# Patient Record
Sex: Female | Born: 1950
Health system: Southern US, Community
[De-identification: ages and names within clinical notes are randomized; demographics above are authoritative.]

## PROBLEM LIST (undated history)

## (undated) DIAGNOSIS — M1990 Unspecified osteoarthritis, unspecified site: Secondary | ICD-10-CM

## (undated) DIAGNOSIS — R0989 Other specified symptoms and signs involving the circulatory and respiratory systems: Secondary | ICD-10-CM

## (undated) DIAGNOSIS — E785 Hyperlipidemia, unspecified: Secondary | ICD-10-CM

## (undated) HISTORY — DX: Hyperlipidemia, unspecified: E78.5

## (undated) HISTORY — PX: NECK SURGERY: SHX720

## (undated) HISTORY — DX: Other specified symptoms and signs involving the circulatory and respiratory systems: R09.89

## (undated) HISTORY — DX: Unspecified osteoarthritis, unspecified site: M19.90

---

## 1998-01-05 ENCOUNTER — Ambulatory Visit (HOSPITAL_COMMUNITY): Admission: RE | Admit: 1998-01-05 | Discharge: 1998-01-05 | Payer: Self-pay | Admitting: *Deleted

## 1998-04-28 ENCOUNTER — Ambulatory Visit (HOSPITAL_COMMUNITY): Admission: RE | Admit: 1998-04-28 | Discharge: 1998-04-28 | Payer: Self-pay | Admitting: Internal Medicine

## 1998-12-28 ENCOUNTER — Ambulatory Visit: Admission: RE | Admit: 1998-12-28 | Discharge: 1998-12-28 | Payer: Self-pay | Admitting: Internal Medicine

## 1999-03-23 ENCOUNTER — Encounter: Payer: Self-pay | Admitting: Emergency Medicine

## 1999-03-23 ENCOUNTER — Emergency Department (HOSPITAL_COMMUNITY): Admission: EM | Admit: 1999-03-23 | Discharge: 1999-03-23 | Payer: Self-pay | Admitting: Emergency Medicine

## 1999-06-07 ENCOUNTER — Encounter: Admission: RE | Admit: 1999-06-07 | Discharge: 1999-06-07 | Payer: Self-pay | Admitting: Internal Medicine

## 1999-06-07 ENCOUNTER — Encounter: Payer: Self-pay | Admitting: Internal Medicine

## 1999-06-27 ENCOUNTER — Encounter: Admission: RE | Admit: 1999-06-27 | Discharge: 1999-06-27 | Payer: Self-pay | Admitting: Internal Medicine

## 1999-06-27 ENCOUNTER — Encounter: Payer: Self-pay | Admitting: Internal Medicine

## 1999-12-02 ENCOUNTER — Encounter: Payer: Self-pay | Admitting: Internal Medicine

## 1999-12-02 ENCOUNTER — Encounter: Admission: RE | Admit: 1999-12-02 | Discharge: 1999-12-02 | Payer: Self-pay | Admitting: Internal Medicine

## 1999-12-20 ENCOUNTER — Encounter: Admission: RE | Admit: 1999-12-20 | Discharge: 1999-12-20 | Payer: Self-pay | Admitting: Internal Medicine

## 1999-12-20 ENCOUNTER — Encounter: Payer: Self-pay | Admitting: Internal Medicine

## 1999-12-27 ENCOUNTER — Other Ambulatory Visit: Admission: RE | Admit: 1999-12-27 | Discharge: 1999-12-27 | Payer: Self-pay | Admitting: Internal Medicine

## 2000-06-29 ENCOUNTER — Encounter: Admission: RE | Admit: 2000-06-29 | Discharge: 2000-06-29 | Payer: Self-pay | Admitting: Internal Medicine

## 2000-06-29 ENCOUNTER — Encounter: Payer: Self-pay | Admitting: Internal Medicine

## 2001-07-02 ENCOUNTER — Encounter: Admission: RE | Admit: 2001-07-02 | Discharge: 2001-07-02 | Payer: Self-pay | Admitting: Internal Medicine

## 2001-07-02 ENCOUNTER — Encounter: Payer: Self-pay | Admitting: Internal Medicine

## 2001-07-06 ENCOUNTER — Encounter: Payer: Self-pay | Admitting: Emergency Medicine

## 2001-07-06 ENCOUNTER — Emergency Department (HOSPITAL_COMMUNITY): Admission: EM | Admit: 2001-07-06 | Discharge: 2001-07-06 | Payer: Self-pay | Admitting: Emergency Medicine

## 2002-09-23 ENCOUNTER — Encounter: Payer: Self-pay | Admitting: Internal Medicine

## 2002-09-23 ENCOUNTER — Encounter: Admission: RE | Admit: 2002-09-23 | Discharge: 2002-09-23 | Payer: Self-pay | Admitting: Internal Medicine

## 2003-03-09 ENCOUNTER — Other Ambulatory Visit: Admission: RE | Admit: 2003-03-09 | Discharge: 2003-03-09 | Payer: Self-pay | Admitting: Internal Medicine

## 2003-05-22 ENCOUNTER — Ambulatory Visit (HOSPITAL_COMMUNITY): Admission: RE | Admit: 2003-05-22 | Discharge: 2003-05-22 | Payer: Self-pay | Admitting: Internal Medicine

## 2003-09-01 ENCOUNTER — Inpatient Hospital Stay (HOSPITAL_COMMUNITY): Admission: RE | Admit: 2003-09-01 | Discharge: 2003-09-02 | Payer: Self-pay | Admitting: Neurosurgery

## 2003-09-12 ENCOUNTER — Ambulatory Visit (HOSPITAL_COMMUNITY): Admission: RE | Admit: 2003-09-12 | Discharge: 2003-09-12 | Payer: Self-pay | Admitting: Neurological Surgery

## 2003-12-17 ENCOUNTER — Emergency Department (HOSPITAL_COMMUNITY): Admission: EM | Admit: 2003-12-17 | Discharge: 2003-12-17 | Payer: Self-pay | Admitting: Emergency Medicine

## 2003-12-18 ENCOUNTER — Ambulatory Visit (HOSPITAL_COMMUNITY): Admission: RE | Admit: 2003-12-18 | Discharge: 2003-12-18 | Payer: Self-pay | Admitting: Internal Medicine

## 2004-01-26 ENCOUNTER — Ambulatory Visit (HOSPITAL_COMMUNITY): Admission: RE | Admit: 2004-01-26 | Discharge: 2004-01-26 | Payer: Self-pay | Admitting: Internal Medicine

## 2004-02-24 ENCOUNTER — Emergency Department (HOSPITAL_COMMUNITY): Admission: EM | Admit: 2004-02-24 | Discharge: 2004-02-25 | Payer: Self-pay | Admitting: Emergency Medicine

## 2004-03-15 ENCOUNTER — Emergency Department (HOSPITAL_COMMUNITY): Admission: EM | Admit: 2004-03-15 | Discharge: 2004-03-15 | Payer: Self-pay | Admitting: Emergency Medicine

## 2004-03-19 ENCOUNTER — Ambulatory Visit (HOSPITAL_COMMUNITY): Admission: RE | Admit: 2004-03-19 | Discharge: 2004-03-19 | Payer: Self-pay | Admitting: Internal Medicine

## 2004-08-20 ENCOUNTER — Encounter: Admission: RE | Admit: 2004-08-20 | Discharge: 2004-09-19 | Payer: Self-pay | Admitting: Internal Medicine

## 2004-11-29 ENCOUNTER — Encounter: Admission: RE | Admit: 2004-11-29 | Discharge: 2004-11-29 | Payer: Self-pay | Admitting: Internal Medicine

## 2005-03-28 ENCOUNTER — Other Ambulatory Visit: Admission: RE | Admit: 2005-03-28 | Discharge: 2005-03-28 | Payer: Self-pay | Admitting: Internal Medicine

## 2005-04-17 ENCOUNTER — Encounter: Admission: RE | Admit: 2005-04-17 | Discharge: 2005-04-17 | Payer: Self-pay | Admitting: Neurology

## 2005-07-14 HISTORY — PX: REDUCTION MAMMAPLASTY: SUR839

## 2006-01-05 ENCOUNTER — Emergency Department (HOSPITAL_COMMUNITY): Admission: EM | Admit: 2006-01-05 | Discharge: 2006-01-05 | Payer: Self-pay | Admitting: Family Medicine

## 2006-02-05 ENCOUNTER — Encounter: Admission: RE | Admit: 2006-02-05 | Discharge: 2006-02-05 | Payer: Self-pay | Admitting: Internal Medicine

## 2006-02-05 ENCOUNTER — Encounter: Admission: RE | Admit: 2006-02-05 | Discharge: 2006-02-05 | Payer: Self-pay | Admitting: Gastroenterology

## 2006-08-07 ENCOUNTER — Ambulatory Visit (HOSPITAL_COMMUNITY): Admission: RE | Admit: 2006-08-07 | Discharge: 2006-08-07 | Payer: Self-pay | Admitting: Gastroenterology

## 2007-01-25 ENCOUNTER — Encounter (INDEPENDENT_AMBULATORY_CARE_PROVIDER_SITE_OTHER): Payer: Self-pay | Admitting: Specialist

## 2007-01-25 ENCOUNTER — Ambulatory Visit (HOSPITAL_BASED_OUTPATIENT_CLINIC_OR_DEPARTMENT_OTHER): Admission: RE | Admit: 2007-01-25 | Discharge: 2007-01-26 | Payer: Self-pay | Admitting: Specialist

## 2007-08-19 ENCOUNTER — Inpatient Hospital Stay (HOSPITAL_COMMUNITY): Admission: EM | Admit: 2007-08-19 | Discharge: 2007-08-20 | Payer: Self-pay | Admitting: Emergency Medicine

## 2008-05-03 ENCOUNTER — Ambulatory Visit: Payer: Self-pay | Admitting: Vascular Surgery

## 2008-12-28 ENCOUNTER — Encounter: Admission: RE | Admit: 2008-12-28 | Discharge: 2008-12-28 | Payer: Self-pay | Admitting: Internal Medicine

## 2009-06-14 ENCOUNTER — Other Ambulatory Visit: Admission: RE | Admit: 2009-06-14 | Discharge: 2009-06-14 | Payer: Self-pay | Admitting: Obstetrics and Gynecology

## 2009-07-02 ENCOUNTER — Encounter: Admission: RE | Admit: 2009-07-02 | Discharge: 2009-07-02 | Payer: Self-pay | Admitting: Internal Medicine

## 2010-01-07 ENCOUNTER — Encounter: Admission: RE | Admit: 2010-01-07 | Discharge: 2010-01-07 | Payer: Self-pay | Admitting: Internal Medicine

## 2010-03-07 ENCOUNTER — Emergency Department (HOSPITAL_COMMUNITY): Admission: EM | Admit: 2010-03-07 | Discharge: 2010-03-08 | Payer: Self-pay | Admitting: Emergency Medicine

## 2010-03-11 ENCOUNTER — Encounter: Admission: RE | Admit: 2010-03-11 | Discharge: 2010-03-11 | Payer: Self-pay | Admitting: Internal Medicine

## 2010-04-02 ENCOUNTER — Encounter: Admission: RE | Admit: 2010-04-02 | Discharge: 2010-04-02 | Payer: Self-pay | Admitting: Gastroenterology

## 2010-09-26 LAB — CBC
HCT: 40.4 % (ref 36.0–46.0)
Hemoglobin: 13.7 g/dL (ref 12.0–15.0)
MCH: 31.9 pg (ref 26.0–34.0)
MCV: 94.2 fL (ref 78.0–100.0)
Platelets: 254 10*3/uL (ref 150–400)
RBC: 4.29 MIL/uL (ref 3.87–5.11)
RDW: 12.5 % (ref 11.5–15.5)
WBC: 5.8 10*3/uL (ref 4.0–10.5)

## 2010-09-26 LAB — POCT CARDIAC MARKERS
CKMB, poc: 1.1 ng/mL (ref 1.0–8.0)
CKMB, poc: <1 ng/mL — ABNORMAL LOW (ref 1.0–8.0)
Myoglobin, poc: 62.7 ng/mL (ref 12–200)
Myoglobin, poc: 79.1 ng/mL (ref 12–200)
Troponin i, poc: <0.05 ng/mL (ref 0.00–0.09)
Troponin i, poc: <0.05 ng/mL (ref 0.00–0.09)

## 2010-09-26 LAB — POCT I-STAT, CHEM 8
BUN: 11 mg/dL (ref 6–23)
Calcium, Ion: 1.16 mmol/L (ref 1.12–1.32)
Chloride: 106 mEq/L (ref 96–112)
Creatinine, Ser: 1 mg/dL (ref 0.4–1.2)
Glucose, Bld: 100 mg/dL — ABNORMAL HIGH (ref 70–99)
HCT: 43 % (ref 36.0–46.0)
Hemoglobin: 14.6 g/dL (ref 12.0–15.0)
Potassium: 3.8 mEq/L (ref 3.5–5.1)
Sodium: 144 mEq/L (ref 135–145)
TCO2: 28 mmol/L (ref 0–100)

## 2010-09-26 LAB — DIFFERENTIAL
Basophils Absolute: 0 10*3/uL (ref 0.0–0.1)
Basophils Relative: 1 % (ref 0–1)
Eosinophils Relative: 3 % (ref 0–5)
Lymphocytes Relative: 55 % — ABNORMAL HIGH (ref 12–46)
Lymphs Abs: 3.2 10*3/uL (ref 0.7–4.0)
Monocytes Absolute: 0.3 10*3/uL (ref 0.1–1.0)
Monocytes Relative: 5 % (ref 3–12)
Neutro Abs: 2.1 10*3/uL (ref 1.7–7.7)
Neutrophils Relative %: 36 % — ABNORMAL LOW (ref 43–77)

## 2010-09-26 LAB — D-DIMER, QUANTITATIVE: D-Dimer, Quant: 0.44 ug/mL-FEU (ref 0.00–0.48)

## 2010-11-21 ENCOUNTER — Ambulatory Visit (HOSPITAL_COMMUNITY)
Admission: EM | Admit: 2010-11-21 | Discharge: 2010-11-21 | Discharge status: Against Medical Advice | Payer: 59 | Attending: Emergency Medicine | Admitting: Emergency Medicine

## 2010-11-21 DIAGNOSIS — R079 Chest pain, unspecified: Secondary | ICD-10-CM | POA: Insufficient documentation

## 2010-11-26 NOTE — Consult Note (Signed)
NEW PATIENT CONSULTATION   ELLYN, RUBIANO  DOB:  [birth year]                                       05/03/2008  ZOXWR#:[id number]   The patient presents today for concern regarding discomfort in her  bilateral lower extremities.  She is an otherwise healthy black female  who reports concern regarding tingling and discomfort over her legs,  particularly in her calves, around the area of spider vein  telangiectasia.  She does not have any history of deep vein thrombosis  or varicose veins.  She does have some swelling in both lower  extremities at the end of the day.   PAST MEDICAL HISTORY:  Negative for major medical difficulties.  She  does have seasonal allergies.  She denies any history of hypertension,  elevated cholesterol, or cardiac difficulties.   FAMILY HISTORY:  Significant for diabetes and cardiac disease.   SOCIAL HISTORY:  She is married.  She does not drink alcohol or smoke  cigarettes.   ALLERGIES TO MEDICATIONS:  None.   CURRENT MEDICATIONS:  Allegra, Singulair, Zetia, Ambien, clonazepam.   REVIEW OF SYSTEMS:  Negative for major cardiac, pulmonary, GI, or GU  dysfunction.   PHYSICAL EXAM:  She is a well-developed, well-nourished thin black  female appearing stated age of 84.  Her radial and dorsalis pedis pulses  are 2+ bilaterally.  She does not have any evidence of varicose veins.  She does not have any significant swelling currently.  She does have  multiple spider vein telangiectasia bilaterally.  These are most  pronounced in her right anterior thigh and left posterior popliteal  fossa.   She underwent a screening hand-held duplex by me and this revealed no  evidence of saphenous vein reflux.  I discussed this with the patient.  I explained that she, fortunately, does not appear to have any more  significant difficulty regarding her venous pathology.  She was  concerned regarding the tingling discomfort and also a strong family  history of lower extremity arterial insufficiency.  I have reassured her  that she does have totally normal arterial flow and is at no risk for  limb loss or major complications associated with arterial insufficiency.  I explained that she appears to have no significant valvular disease in  her superficial system.  I explained that the telangiectasia can  occasionally cause some tingling discomfort.  I explained the potential  treatment for this, which would be sclerotherapy.  I explained that she,  in all likelihood will have progressive telangiectasia over time, since  she is forming them currently.  I also explained that this would not be  covered by insurance, and I gave her an estimate regarding the cost for  treating the areas of concern.  She will discuss this with her husband  and notify us should she wish to proceed with sclerotherapy for  treatment of her spider vein telangiectasia.   Larina Earthly, M.D.  Electronically Signed   TFE/MEDQ  D:  05/03/2008  T:  05/04/2008  Job:  [id number]   cc:   Lovenia Kim, D.O.

## 2010-11-26 NOTE — Op Note (Signed)
NAMESPARROW, SIRACUSA          ACCOUNT NO.:  [id number]   MEDICAL RECORD NO.:  [id number]          PATIENT TYPE:  AMB   LOCATION:  DSC                          FACILITY:  [facility]   PHYSICIAN:  Earvin Hansen L. Truesdale, M.D.DATE OF BIRTH:  [birth year]   DATE OF PROCEDURE:  01/25/2007  DATE OF DISCHARGE:                               OPERATIVE REPORT   INDICATIONS FOR PROCEDURE:  This is a 60 year old lady with severe  macromastia with back and shoulder pain secondary to large pendulous  breasts.   PROCEDURES PERFORMED:  Bilateral breast reductions using the inferior  pedicle technique.   ANESTHESIA:  General.   DESCRIPTION OF PROCEDURE:  The patient underwent general anesthesia and  intubated orally.  Prep was done to the chest and breast areas in a  routine fashion using Betadine soap and solution, walled off with  sterile towels and drape so as to make a sterile field.  Then 0.25%  Xylocaine with epinephrine was injected locally for vasoconstriction at  a total of 150 mL per side of 1:200,000 concentration.  The wound was  then scored with #15 blades after sterile prep was done and then the  skin on the inferior pedicle was de-epithelialized with a #20 blade.  The medial and lateral fat sternal pedicles were excised down to  underlying fascia.  Hemostasis was maintained with the Bovie unit on  coagulation.   After this, the patient underwent excision of accessory breast tissue  laterally and the new keyhole area was also debulked.  After proper  hemostasis the flaps were transposed and stayed with 3-0 Prolene.  Subcutaneous closure was done with 3-0 and 2-0 Monocryl x two layers,  and then a running subcuticular stitch of 3-0 Monocryl.  The wounds were  drained with a #10 Blake drain fully floated which was placed in the  depths of the wound and brought out through the lateralmost portion of  the incisions and secured with 3-0 Prolene.  The wounds were cleansed.  Steri-Strips and  soft dressings were applied to the all the areas.  She  withstood the procedures very well and was taken to recovery in  excellent condition after sterile dressings were applied using Xeroform,  ABDs and Hypafix tape.   ESTIMATED BLOOD LOSS:  Estimated blood loss was less than 150 mL.   COMPLICATIONS:  None.      Yaakov Guthrie. Shon Hough, M.D.  Electronically Signed     GLT/MEDQ  D:  01/25/2007  T:  01/25/2007  Job:  [id number]

## 2010-11-26 NOTE — Discharge Summary (Signed)
Susan Carr, Susan Carr          ACCOUNT NO.:  [id number]   MEDICAL RECORD NO.:  [id number]          PATIENT TYPE:  INP   LOCATION:  5511                         FACILITY:  [facility]   PHYSICIAN:  Lucita Ferrara, MD         DATE OF BIRTH:  11-06-1950   DATE OF ADMISSION:  08/18/2007  DATE OF DISCHARGE:  08/20/2007                               DISCHARGE SUMMARY   ADMISSION DIAGNOSES:  1. Community acquired pneumonia.  2. Mild dehydration and weakness.  3. Chronic headaches.  4. Pleuritic type chest pain secondary to #1.  5. Questionable presyncopal episode at home.   CONSULTATIONS:  None.   PRIMARY CARE PHYSICIAN:  Lovenia Kim, D.O.   BRIEF HISTORY OF PRESENT ILLNESS:  Susan Carr is a 60 year old  African American female who presented to [facility] on  August 19, 2007, with generalized body aches, feeling subjective cold  and hot, pleuritic-type chest pain on the right side and feeling weak.  This was accompanied by cough and sore throat.  Cough was nonproductive.  She went to her primary care doctor, Dr. Elisabeth Most, and she was told  that she was mildly dehydrated.  She was sent here for IV fluids and IV  hydration.  Initially upon presentation, her laboratory and clinical  evaluation did not suggest any dehydration with BUN of 8 and creatinine  1.2.  Her mucous membranes were indeed moist.  She was admitted for  community acquired pneumonia given chest x-ray findings suggestive of  left lower lobe infiltrate.  In addition to chest x-ray, her vital signs  showed that she was afebrile.  She was not  hypotensive and she was 100%  on room air.  For completeness, we also got an ABG which showed her pH  to be 7.371/pCO2 to be 38.2/pAO2 94.3.  Given that she also had some  chest pain, we did cardiac enzymes x3 which were negative.  Given that  she had pneumonia, we screened her for influenza A and B and rapid Strep  test which was also negative.  Repeat chest x-ray  showed better aeration  and resolving pneumonia.  She was empirically started on community  acquired pneumonia coverage with Zithromax and Rocephin.  To date, she  is afebrile.  She is able to tolerate all of her p.o. intake and food  without nausea or vomiting and her weakness has so far improved.  The  dehydration was mild and the presyncopal episode was thought to be  perhaps secondary to her pneumonia and it has since resolved.  Initially  her white count was found to be elevated at 19.7. Today it has nicely  come down to 7.9 with no left shift.   DISCHARGE MEDICATIONS:  She will be discharged home today with  1. Albuterol metered dose inhaler q.6h. and q.4h. p.r.n.  2. Aspirin 81 mg p.o. daily.  3. She is going to be going home with Doxycycline 100 mg p.o. b.i.d.      x10 days for community acquired pneumonia coverage.  4. She is going to continue her preadmission medications including  a.     Clonazepam one p.o. b.i.d.      b.     Zetia 10 mg p.o. nightly.      c.     Ambien 5 mg p.o. nightly p.r.n.      d.     Imitrex 25 mg p.o. daily.      e.     AcipHex per preadmission dose.   During this admission also her pneumonia severity index was calculated.  It was calculated to be class I, mortality 0.1%, score 46.   VITAL SIGNS:  Today, she is hemodynamically stable, her vital signs are  stable, temperature 99.4, pulse 188, respirations 20, blood pressure  102/58, pulse oximeter 100% on room air.  HEENT:  Normocephalic and atraumatic.  Sclerae are anicteric. PERRLA.  Extraocular muscles intact.  Moist mucous membranes.  NECK:  Supple.  No JVD, no carotid bruits.  CARDIOVASCULAR:  S1 and S2.  Regular rate and rhythm.  No murmurs, rubs  or clicks.  ABDOMEN:  Soft, nontender, nondistended, positive bowel sounds.  LUNGS:  Clear to auscultation bilaterally, no wheezing, rhonchi or  rales.  EXTREMITIES:  No clubbing, cyanosis, or edema.  NEUROLOGIC:  Patient alert and oriented  x3.  Cranial nerves II-XII  grossly intact.   FOLLOW UP:  She is to follow up with her primary care doctor in regards  to chronic medical problems including migraine headaches.  She is  hemodynamically stable.      Lucita Ferrara, MD  Electronically Signed     RR/MEDQ  D:  08/20/2007  T:  08/20/2007  Job:  [id number]   cc:   Lovenia Kim, D.O.

## 2010-11-26 NOTE — H&P (Signed)
NAMEMarland Kitchen  MONNIE, GUDGEL NO.:  [id number]   MEDICAL RECORD NO.:  [id number]          PATIENT TYPE:  EMS   LOCATION:  MAJO                         FACILITY:  [facility]   PHYSICIAN:  Michaelyn Barter, M.D. DATE OF BIRTH:  [birth year]   DATE OF ADMISSION:  08/18/2007  DATE OF DISCHARGE:                              HISTORY & PHYSICAL   PRIMARY CARE PHYSICIAN:  Lovenia Kim, D.O.   CHIEF COMPLAINT:  Generalized aches.   HISTORY OF PRESENT ILLNESS:  Mrs. Huether is a 60 year old female who  states that she has not been feeling too good since last night.  She  developed generalized aches as well as chest pain.  She indicates that  she has been feeling cold and hot. She indicated that her chest began to  hurt when she would lay on the right side.  She started to feel so weak  throughout the course of the night that she had multiple episodes  whereby she felt as if she was going to pass out.  She started to see  spots at one particular time when she went from lying in bed to standing  up. She felt very weak.  She complains of a cough as well as sore throat  and headache.  She also has been experiencing some subjective fevers as  well as chill. She went to see her primary care physician, Dr. Marisue Brooklyn, this morning and states that she was told that she was  dehydrated.  Dr. Elisabeth Most referred the patient to the hospital for IV  fluid.   PAST MEDICAL HISTORY:  1. High cholesterol.  2. Migraines.   PAST SURGICAL HISTORY:  1. Foot surgery.  2. Shoulder surgery.   ALLERGIES:  None.   HOME MEDICATIONS:  1. Klonopin 1 mg b.i.d.  2. Ambien.  3. Zetia.  4. Aciphex.  5. Imitrex.   SOCIAL HISTORY:  Cigarettes:  Never.  Alcohol: Never.   FAMILY HISTORY:  Mother had history of diabetes mellitus, hypertension  and heart problems.  Father had heart problems.   REVIEW OF SYSTEMS:  As per HPI.  Otherwise all other review of systems  are negative.   PHYSICAL  EXAMINATION:  VITAL SIGNS: Temperature 99.1, blood pressure  117/66, heart rate 105, respirations 22.  Oxygen saturation 100%.  HEENT:  Normocephalic, atraumatic.  Anicteric.  Extraocular movements  were intact. Oral mucosa is pink.  No thrush. No exudates.  Dry.  NECK:  Supple.  No JVD.  No lymphadenopathy.  No thyromegaly.  CARDIAC: S1 and S2 present.  Regular rate and rhythm.  No murmurs, rubs  or gallops.  LUNGS:  No crackles.  No wheezes.  ABDOMEN:  Flat, soft, nontender, nondistended.  Positive bowel sounds in  all four quadrants.  No masses palpated.  EXTREMITIES:  No leg edema.  NEUROLOGIC:  The patient is alert and oriented x3.  MUSCULOSKELETAL:  Approximately 4.5/4.5 upper and lower extremity  strength.   LABORATORY DATA:  Chest x-ray reveals a left lower lobe pneumonia.  PH  7.433, pCO2 37.8,bicarb 25.3.  hemoglobin 15, hematocrit 44. Sodium 141,  potassium 4.3, chloride 107, glucose  110, BUN 8.  Creatinine 1.2.  White  blood cell count is 19.7, hemoglobin 13.6, hematocrit 40.4, platelets  248.   ASSESSMENT/PLAN:  1. Community-acquired pneumonia.  Will start the patient on empiric IV      antibiotics.  Will check blood cultures as well as sputum cultures.  2. Dehydration.  Will gently hydrate the patient.  3. Weakness.  This may be secondary to the community-acquired      pneumonia as well as dehydration.  Will treat as already outlined.  4. Headache.  Will consider starting Imitrex on a p.r.n. basis as this      may be associated with the migraine.  5. Chest pain.  The patient's description is atypical. This may be      associated with the community-acquired pneumonia.  Will cycle her      cardiac enzymes.  I have already reviewed the patient's EKG and it      reveals sinus tachycardia.  No Q waves or ST segment abnormalities.  6. Presyncope.  This may be secondary to dehydration.  Will gently      hydrate the patient and monitor.  7. GI prophylaxis.  Will provide  Protonix.  8. DVT prophylaxis. Will provide Lovenox.      Michaelyn Barter, M.D.  Electronically Signed     OR/MEDQ  D:  08/19/2007  T:  08/19/2007  Job:  [id number]   cc:   Lovenia Kim, D.O.

## 2010-11-29 NOTE — Op Note (Signed)
Susan Carr, Susan Carr          ACCOUNT NO.:  [id number]   MEDICAL RECORD NO.:  [id number]          PATIENT TYPE:  AMB   LOCATION:  ENDO                         FACILITY:  [facility]   PHYSICIAN:  Petra Kuba, M.D.    DATE OF BIRTH:  02-19-1951   DATE OF PROCEDURE:  DATE OF DISCHARGE:                               OPERATIVE REPORT   PROCEDURE:  Esophagogastroduodenoscopy.   INDICATIONS:  Upper tract symptoms, abdominal pain.   CONSENT:  Consent was signed after risks, benefits, methods and options  thoroughly discussed in the office.   ADDITIONAL MEDICINES FOR THIS PROCEDURE:  25 fentanyl, Versed 2.   PROCEDURE:  The videoendoscope was inserted by direct vision.  Esophagus  was normal.  Possibly she had a very tiny hiatal hernia.  Scope passed  into the stomach, advanced through the antrum where some minimal  enteritis was seen.  Advanced through a normal pylorus into a normal  duodenal bulb and around the celiac to a normal second portion of the  duodenum.  The scope was withdrawn back to the bulb and a good look  there ruled out abnormalities in all locations.  Scope was withdrawn  back into the stomach and retroflexed.  The angularis, cardia, fundus,  lesser and greater curves were evaluated on retroflex visualization,  which was normal.  Straight visualization of the stomach did not reveal  any additional findings.  The area was suctioned.  The scope was slowly  withdrawn again.  A good look at the esophagus was normal.  The scope  was removed.  The patient tolerated the procedure well.  There were no  obvious immediate complications.   DIAGNOSES:  1. Questionable tiny hiatal hernia.  2. Very minimal enteritis.  3. Otherwise normal esophagogastroduodenoscopy.   PLAN:  1. Have patient back p.r.n. or can leave further workup and medicine      trials to Dr. Elisabeth Most.  2. Would consider CT or small bowel series.  3. I believe she has had an ultrasound to rule out  gallstones.      Otherwise consider other medicine options for constipation,      hematochezia.  Miralac has been helpful.  Probably antispasmodics      or antidepressants as needed if she has irritable bowel syndrome.           ______________________________  Petra Kuba, M.D.     MEM/MEDQ  D:  08/07/2006  T:  08/07/2006  Job:  [id number]

## 2010-11-29 NOTE — Op Note (Signed)
Susan Carr, KUBIN          ACCOUNT NO.:  [id number]   MEDICAL RECORD NO.:  [id number]          PATIENT TYPE:  AMB   LOCATION:  ENDO                         FACILITY:  [facility]   PHYSICIAN:  Petra Kuba, M.D.    DATE OF BIRTH:  Jul 19, 1950   DATE OF PROCEDURE:  08/07/2006  DATE OF DISCHARGE:                               OPERATIVE REPORT   PROCEDURE:  Colonoscopy.   INDICATION:  Patient with multiple GI complaints due for colonic  screening.  Consent was signed after risks, benefits, methods and  options thoroughly discussed in the office.   MEDICINES USED:  Fentanyl 125 mcg, Versed 12 mg.   PROCEDURE:  Rectal inspection pertinent for small external hemorrhoids.  Digital exam was negative.  The video pediatric colonoscope was  inserted, easily advanced around the colon to the cecum.  No position  changes or abdominal pressure was needed.  No abnormality was seen on  insertion.  Cecum was identified by the appendiceal orifice and  ileocecal valve.  In fact the scope was inserted a short ways in the  terminal ileum which was normal.  Photodocumentation was obtained and  the scope was slowly withdrawn.  Prep was adequate.  There was some  liquid stool that required washing and suctioning.  On slow withdrawal  back to the rectum, no abnormalities were seen, specifically no polyps,  tumors, masses or diverticula.  Once back in the rectum, anorectal pull-  through and retroflexion confirmed some small hemorrhoids.  Scope was  straightened and readvanced a short ways up the left side of the colon.  Air was suctioned, scope removed.  The patient tolerated the procedure  well.  There was no obvious immediate complication.   ENDOSCOPIC DIAGNOSES:  1. Internal-external small hemorrhoids.  2. Otherwise within normal limits to the terminal ileum.   PLAN:  Re screen in five to 10 years.  Continue workup with an EGD.           ______________________________  Petra Kuba,  M.D.     MEM/MEDQ  D:  08/07/2006  T:  08/07/2006  Job:  [id number]   cc:   Lovenia Kim, D.O.

## 2010-11-29 NOTE — Op Note (Signed)
Susan Carr, Susan Carr                      ACCOUNT NO.:  [id number]   MEDICAL RECORD NO.:  [id number]                   PATIENT TYPE:  INP   LOCATION:  3011                                 FACILITY:  [facility]   PHYSICIAN:  Danae Orleans. Venetia Maxon, M.D.               DATE OF BIRTH:  25-Mar-1951   DATE OF PROCEDURE:  09/01/2003  DATE OF DISCHARGE:  09/02/2003                                 OPERATIVE REPORT   PREOPERATIVE DIAGNOSIS:  Herniated cervical disk, C5-6 and C6-7 with  spondylosis, degenerative disk and cervical radiculopathy.   POSTOPERATIVE DIAGNOSIS:  Herniated cervical disk, C5-6 and C6-7 with  spondylosis, degenerative disk and cervical radiculopathy.   PROCEDURE:  Anterior cervical decompression and fusion, C5-6 levels with  allograft bone graft in the anterior cervical plate.   SURGEON:  Danae Orleans. Venetia Maxon, M.D.   ASSISTANT:  Clydene Fake, M.D.   ANESTHESIA:  General endotracheal anesthesia.   ESTIMATED BLOOD LOSS:  Minimal.   COMPLICATIONS:  None.   DISPOSITION:  Recovery.   INDICATIONS:  Susan Carr is a 60 year old woman with cervical  spondylosis and cervical disk herniation at C6-7 on the right, with  biforaminal stenosis at C5-6 with neck pain and bilateral shoulder and upper  extremity pain with weakness involving her biceps bilaterally, wrist  extension and triceps on the right.  We have elected to take her to surgery  for anterior cervical decompression and fusion at these two affected levels.   DESCRIPTION OF PROCEDURE:  [name] is brought to the operating room.  Following the satisfactory and uncomplicated induction of general  endotracheal anesthesia, the patient was placed in the supine position on  the operating table.  Her neck was placed in slight extension.  She was  placed in 10 pounds of Holter traction.  Her anterior neck was prepped and  draped in the usual sterile fashion.  The area of planned incision was  infiltrated with 0.25%  Marcaine and 0.5% lidocaine with 1:200,000  epinephrine.  Incision was made in the midline to the anterior border of the  sternocleidomastoid muscle and carried sharply to the platysmal layer.  Subplatysmal dissection was performed exposing the anterior border of the  sternocleidomastoid muscle.  Using blunt dissection, the carotid sheath was  kept lateral.  The trachea and esophagus were kept medial and the anterior  cervical spine was exposed.  What was felt to be the C5-6 level was  identified, and a bent spinal needle was placed at this level.  This was  confirmed on intraoperative x-ray.  Subsequently the longus coli muscles  were taken down from the anterior cervical spine from C7 through C5 levels  bilaterally using electrocautery and Key elevator.  The Shadowline self-  retaining retractor was placed along with up and down retractor.  The  interspace at C5-6 and subsequently the C6-7 levels were incised, and the  disk material was removed in a piecemeal fashion using a  variety of Carlen  curets.  The endplates were stripped of residual disk material.  The disk  space spreaders were placed, initially at the C5-6 and subsequently the C6-7  levels and further diskectomy was performed.  The microscope was then  brought into the field using high powered microscopic visualization, high  speed drill with 2 mm matchstick bur.  The end plates of C5 and C6 were  decorticated and then uncinate spurs drilled down.  The posterior  longitudinal ligament was then incised with the arachnoid knife and removed  in a piecemeal fashion.  The central spinal cord dura in both C6 nerve roots  were decompressed widely and extended up the neural foramina.  Hemostasis  was ensured with Gelfoam soaked in thrombin.  After trial sizing, the 7 mm  femoral bone cervical spacer was packed with morselized local bone  autograft.  The transgraft spacer was utilized.  This was tamped in position  and countersunk  appropriately.  Attention was then turned to the C6-7 level  where a similar decompression was performed.  There was a large amount of  soft disk material on the right side of the midline and extending out the  neural foramen which was causing pressure in the C7 nerve root.  This was  decompressed, and the central spine cord dura was decompression and both C7  nerve roots were decompressed.  The uncinate spurs were removed with drill  and later with Kerrison rongeurs.  Hemostasis was again assured at this  level and a similarly excised bone graft was placed after trial sizing.  This was also packed with the drillings from the end-plates which were saved  and packed in the central hole of the bone graft.  Subsequently a 34 mm  Alphatec Reveal anterior cervical plate was affixed to the anterior cervical  spine using 14 mm self drilling screws, two at C5, two at C6 and two at C7  levels.  The screws had excellent purchase.  Final x-ray demonstrated well-  positioned bone grafts in the anterior cervical plate.  Prior to placing the  plate, the Holter traction was removed.  After confirming x-ray, the locking  mechanisms were engaged at each level.  The wound was then copiously  irrigated with Bacitracin and saline.  The self-retaining retractor was  removed.  The platysmal layer was closed with 3-0 Vicryl sutures.  The skin  edges were reapproximated with running 4-0 Vicryl subcuticular stitch.  The  wound was dressed with Dermabond.  The patient was extubated in the  operating room and taken to the recovery room in stable and satisfactory  condition.  She tolerated the procedure well.  Counts were correct at the  end of the case.                                               Danae Orleans. Venetia Maxon, M.D.    JDS/MEDQ  D:  09/01/2003  T:  09/02/2003  Job:  [id number]

## 2011-03-14 ENCOUNTER — Inpatient Hospital Stay (INDEPENDENT_AMBULATORY_CARE_PROVIDER_SITE_OTHER)
Admission: RE | Admit: 2011-03-14 | Discharge: 2011-03-14 | Disposition: A | Discharge status: Home / Self Care | Payer: 59 | Source: Ambulatory Visit | Attending: Emergency Medicine | Admitting: Emergency Medicine

## 2011-03-14 DIAGNOSIS — T7840XA Allergy, unspecified, initial encounter: Secondary | ICD-10-CM

## 2011-04-04 LAB — COMPREHENSIVE METABOLIC PANEL
ALT: 16
AST: 19
Albumin: 2.9 — ABNORMAL LOW
BUN: 4 — ABNORMAL LOW
Calcium: 8.6
Chloride: 111
Creatinine, Ser: 0.84
Glucose, Bld: 109 — ABNORMAL HIGH
Potassium: 3.2 — ABNORMAL LOW
Sodium: 142
Total Bilirubin: 0.6
Total Protein: 6.4

## 2011-04-04 LAB — CULTURE, BLOOD (ROUTINE X 2)
Culture: NO GROWTH
Culture: NO GROWTH

## 2011-04-04 LAB — BLOOD GAS, ARTERIAL
FIO2: 0.21
O2 Saturation: 97.4
Patient temperature: 98.6
TCO2: 22.8
pCO2 arterial: 38.2
pH, Arterial: 7.371
pO2, Arterial: 94.3

## 2011-04-04 LAB — I-STAT 8, (EC8 V) (CONVERTED LAB)
BUN: 8
Chloride: 107
Glucose, Bld: 110 — ABNORMAL HIGH
Hemoglobin: 15
Operator id: 196461
Potassium: 4.3
Sodium: 141
TCO2: 26
pH, Ven: 7.433 — ABNORMAL HIGH

## 2011-04-04 LAB — CK TOTAL AND CKMB (NOT AT ARMC)
CK, MB: 0.5
CK, MB: 1.7
Relative Index: 0.2
Relative Index: 0.9
Relative Index: 4.6 — ABNORMAL HIGH
Total CK: 135

## 2011-04-04 LAB — CBC
HCT: 36.8
HCT: 40.4
Hemoglobin: 12.4
Hemoglobin: 13.6
MCHC: 33.7
MCHC: 33.7
MCV: 93.8
MCV: 94.6
Platelets: 214
Platelets: 248
RBC: 3.89
RBC: 4.3
RDW: 12.4
RDW: 12.4
WBC: 19.7 — ABNORMAL HIGH
WBC: 7.9

## 2011-04-04 LAB — DIFFERENTIAL
Basophils Absolute: 0
Eosinophils Absolute: 0.1
Eosinophils Relative: 0
Lymphocytes Relative: 5 — ABNORMAL LOW
Lymphs Abs: 1
Monocytes Absolute: 0.3
Monocytes Relative: 1 — ABNORMAL LOW

## 2011-04-04 LAB — PHOSPHORUS: Phosphorus: 2.9

## 2011-04-04 LAB — MAGNESIUM: Magnesium: 2

## 2011-04-04 LAB — INFLUENZA A+B VIRUS AG-DIRECT(RAPID): Influenza B Ag: NEGATIVE

## 2011-04-04 LAB — RAPID STREP SCREEN (MED CTR MEBANE ONLY): Streptococcus, Group A Screen (Direct): NEGATIVE

## 2011-04-04 LAB — TROPONIN I
Troponin I: 0.03
Troponin I: <0.01

## 2011-04-04 LAB — POCT I-STAT CREATININE: Operator id: 196461

## 2011-04-29 LAB — POCT HEMOGLOBIN-HEMACUE
Hemoglobin: 13.8
Operator id: 112821

## 2011-08-19 ENCOUNTER — Other Ambulatory Visit: Payer: Self-pay | Admitting: Internal Medicine

## 2011-08-19 DIAGNOSIS — K839 Disease of biliary tract, unspecified: Secondary | ICD-10-CM

## 2011-08-21 ENCOUNTER — Ambulatory Visit
Admission: RE | Admit: 2011-08-21 | Discharge: 2011-08-21 | Disposition: A | Discharge status: Home / Self Care | Payer: 59 | Source: Ambulatory Visit | Attending: Internal Medicine | Admitting: Internal Medicine

## 2011-08-21 DIAGNOSIS — K839 Disease of biliary tract, unspecified: Secondary | ICD-10-CM

## 2011-10-22 ENCOUNTER — Other Ambulatory Visit (HOSPITAL_COMMUNITY)
Admission: RE | Admit: 2011-10-22 | Discharge: 2011-10-22 | Disposition: A | Discharge status: Home / Self Care | Payer: 59 | Source: Ambulatory Visit | Attending: Obstetrics and Gynecology | Admitting: Obstetrics and Gynecology

## 2011-10-22 ENCOUNTER — Other Ambulatory Visit: Payer: Self-pay | Admitting: Obstetrics and Gynecology

## 2011-10-22 DIAGNOSIS — Z01419 Encounter for gynecological examination (general) (routine) without abnormal findings: Secondary | ICD-10-CM | POA: Insufficient documentation

## 2011-10-22 LAB — HM DEXA SCAN

## 2011-11-07 ENCOUNTER — Other Ambulatory Visit: Payer: Self-pay | Admitting: Obstetrics and Gynecology

## 2011-11-07 DIAGNOSIS — Z1231 Encounter for screening mammogram for malignant neoplasm of breast: Secondary | ICD-10-CM

## 2012-01-09 ENCOUNTER — Ambulatory Visit
Admission: RE | Admit: 2012-01-09 | Discharge: 2012-01-09 | Disposition: A | Discharge status: Home / Self Care | Payer: 59 | Source: Ambulatory Visit | Attending: Obstetrics and Gynecology | Admitting: Obstetrics and Gynecology

## 2012-01-09 DIAGNOSIS — Z1231 Encounter for screening mammogram for malignant neoplasm of breast: Secondary | ICD-10-CM

## 2012-01-13 ENCOUNTER — Other Ambulatory Visit: Payer: Self-pay | Admitting: Obstetrics and Gynecology

## 2012-01-13 DIAGNOSIS — R928 Other abnormal and inconclusive findings on diagnostic imaging of breast: Secondary | ICD-10-CM

## 2012-10-21 ENCOUNTER — Other Ambulatory Visit (HOSPITAL_COMMUNITY)
Admission: RE | Admit: 2012-10-21 | Discharge: 2012-10-21 | Disposition: A | Discharge status: Home / Self Care | Payer: 59 | Source: Ambulatory Visit | Attending: Obstetrics and Gynecology | Admitting: Obstetrics and Gynecology

## 2012-10-21 ENCOUNTER — Other Ambulatory Visit: Payer: Self-pay | Admitting: Obstetrics and Gynecology

## 2012-10-21 DIAGNOSIS — Z1151 Encounter for screening for human papillomavirus (HPV): Secondary | ICD-10-CM | POA: Insufficient documentation

## 2012-10-21 DIAGNOSIS — Z01419 Encounter for gynecological examination (general) (routine) without abnormal findings: Secondary | ICD-10-CM | POA: Insufficient documentation

## 2012-10-26 ENCOUNTER — Ambulatory Visit (HOSPITAL_COMMUNITY)
Admission: RE | Admit: 2012-10-26 | Discharge: 2012-10-26 | Disposition: A | Discharge status: Home / Self Care | Payer: 59 | Source: Ambulatory Visit | Attending: Internal Medicine | Admitting: Internal Medicine

## 2012-10-26 ENCOUNTER — Other Ambulatory Visit (HOSPITAL_COMMUNITY): Payer: Self-pay | Admitting: Internal Medicine

## 2012-10-26 DIAGNOSIS — M19049 Primary osteoarthritis, unspecified hand: Secondary | ICD-10-CM | POA: Insufficient documentation

## 2012-10-26 DIAGNOSIS — M79609 Pain in unspecified limb: Secondary | ICD-10-CM | POA: Insufficient documentation

## 2012-10-26 DIAGNOSIS — R52 Pain, unspecified: Secondary | ICD-10-CM

## 2012-11-26 ENCOUNTER — Encounter (HOSPITAL_COMMUNITY): Payer: Self-pay | Admitting: Emergency Medicine

## 2012-11-26 ENCOUNTER — Emergency Department (HOSPITAL_COMMUNITY): Payer: 59

## 2012-11-26 ENCOUNTER — Emergency Department (HOSPITAL_COMMUNITY)
Admission: EM | Admit: 2012-11-26 | Discharge: 2012-11-27 | Disposition: A | Discharge status: Home / Self Care | Payer: 59 | Attending: Emergency Medicine | Admitting: Emergency Medicine

## 2012-11-26 DIAGNOSIS — W19XXXA Unspecified fall, initial encounter: Secondary | ICD-10-CM

## 2012-11-26 DIAGNOSIS — T148XXA Other injury of unspecified body region, initial encounter: Secondary | ICD-10-CM

## 2012-11-26 DIAGNOSIS — IMO0002 Reserved for concepts with insufficient information to code with codable children: Secondary | ICD-10-CM | POA: Insufficient documentation

## 2012-11-26 DIAGNOSIS — M25562 Pain in left knee: Secondary | ICD-10-CM

## 2012-11-26 DIAGNOSIS — W1789XA Other fall from one level to another, initial encounter: Secondary | ICD-10-CM | POA: Insufficient documentation

## 2012-11-26 DIAGNOSIS — S0993XA Unspecified injury of face, initial encounter: Secondary | ICD-10-CM | POA: Insufficient documentation

## 2012-11-26 DIAGNOSIS — S8990XA Unspecified injury of unspecified lower leg, initial encounter: Secondary | ICD-10-CM | POA: Insufficient documentation

## 2012-11-26 DIAGNOSIS — Y9229 Other specified public building as the place of occurrence of the external cause: Secondary | ICD-10-CM | POA: Insufficient documentation

## 2012-11-26 DIAGNOSIS — Y9301 Activity, walking, marching and hiking: Secondary | ICD-10-CM | POA: Insufficient documentation

## 2012-11-26 MED ORDER — ONDANSETRON 8 MG PO TBDP
8.0000 mg | ORAL_TABLET | Freq: Once | ORAL | Status: AC
  Administered 2012-11-27: 8 mg via ORAL
  Filled 2012-11-26: qty 1

## 2012-11-26 MED ORDER — HYDROCODONE-ACETAMINOPHEN 5-325 MG PO TABS
1.0000 | ORAL_TABLET | Freq: Once | ORAL | Status: AC
  Administered 2012-11-27: 1 via ORAL
  Filled 2012-11-26: qty 1

## 2012-11-26 NOTE — ED Provider Notes (Signed)
History    This chart was scribed for Susan Silk, PA working with Geoffery Lyons, MD by ED Scribe, Burman Nieves. This patient was seen in room WTR8/WTR8 and the patient's care was started at 11:25 PM.   CSN: [id number]  Arrival date & time 11/26/12  2317   First MD Initiated Contact with Patient 11/26/12 2325      Chief Complaint  Patient presents with  . Fall    (Consider location/radiation/quality/duration/timing/severity/associated sxs/prior treatment) Patient is a 62 y.o. female presenting with fall. The history is provided by the patient. No language interpreter was used.  Fall The fall occurred while walking. She fell from a height of 1 to 2 ft. She landed on concrete.   HPI Comments: Susan Carr is a 62 y.o. female who presents to the Emergency Department in c-collar complaining of moderate constant left knee pain onset earlier this evening. Pt states that she tripped over an object on the ground while walking out of church resulting in falling and hitting her left knee on the pavement. Bleeding is currently controlled in the ED. Pt was able to ambulate after incident. Pt denies LOC or HI. Pt denies fever, chills, cough, nausea, vomiting, diarrhea, SOB, weakness, and any other associated symptoms.Pt has history of neck surgery. Pt's current PCP is Dr. Oneta Rack.     History reviewed. No pertinent past medical history.  Past Surgical History  Procedure Laterality Date  . Neck surgery      No family history on file.  History  Substance Use Topics  . Smoking status: Never Smoker   . Smokeless tobacco: Not on file  . Alcohol Use: No    OB History   Grav Para Term Preterm Abortions TAB SAB Ect Mult Living                  Review of Systems  HENT: Positive for neck pain.   Musculoskeletal: Positive for myalgias, joint swelling and arthralgias.  All other systems reviewed and are negative.    Allergies  Review of patient's allergies indicates no known  allergies.  Home Medications  No current outpatient prescriptions on file.  BP 161/86  Pulse 71  Temp(Src) 98.8 F (37.1 C)  Resp 18  Ht 5\' 4"  (1.626 m)  Wt 155 lb (70.308 kg)  BMI 26.59 kg/m2  SpO2 100%  Physical Exam  Nursing note and vitals reviewed. Constitutional: She is oriented to person, place, and time. She appears well-developed and well-nourished. No distress.  HENT:  Head: Normocephalic and atraumatic.  Right Ear: External ear normal.  Left Ear: External ear normal.  Nose: Nose normal.  Mouth/Throat: Oropharynx is clear and moist.  No deformity or pain when asked to open and close mouth  Eyes: Conjunctivae are normal.  Neck: Normal range of motion.  Cardiovascular: Normal rate, regular rhythm and normal heart sounds.   Pulmonary/Chest: Effort normal and breath sounds normal. No stridor. No respiratory distress. She has no wheezes. She has no rales.  Abdominal: Soft. She exhibits no distension.  Musculoskeletal: She exhibits tenderness.   Tender upon palpation over diffuse left knee. Tender upon palpation to c-spine, tender upon palpation to chin, and no trismus.    Neurological: She is alert and oriented to person, place, and time. She has normal strength.  Skin: Skin is warm and dry. She is not diaphoretic. No erythema.  3 cm abrasion on her left knee.   Psychiatric: She has a normal mood and affect. Her behavior is normal.  ED Course  Procedures (including critical care time) DIAGNOSTIC STUDIES: Oxygen Saturation is 100% on room air, normal by my interpretation.    COORDINATION OF CARE: 11:33 PM C collar taken off. 11:39 PM Discussed ED treatment with pt and pt agrees.     Labs Reviewed - No data to display Dg Cervical Spine Complete  11/27/2012   *RADIOLOGY REPORT*  Clinical Data: Status post fall  CERVICAL SPINE - COMPLETE 4+ VIEW  Comparison: None.  Findings: Normal alignment of the cervical spine.  The patient is status post anterior cervical  disc fusion of C5-C7.  There is disc space narrowing and endplate spurring at C2-3, C3-4 C4-5.  The prevertebral soft tissue space appears normal.  No fracture or subluxation identified.  IMPRESSION:  1.  No acute findings. 2.  Status post anterior cervical disc fusion of C5-C7. 3.  Multilevel cervical degenerative disc disease   Original Report Authenticated By: Signa Kell, M.D.   Dg Knee Complete 4 Views Left  11/27/2012   *RADIOLOGY REPORT*  Clinical Data: Status post fall.  LEFT KNEE - COMPLETE 4+ VIEW  Comparison: None.  Findings: There is no evidence of fracture or dislocation.  There is no evidence of arthropathy or other focal bone abnormality. Soft tissues are unremarkable.  IMPRESSION: Negative exam.   Original Report Authenticated By: Signa Kell, M.D.     1. Fall, initial encounter   2. Abrasion   3. Knee pain, acute, left       MDM  Patient presents after a fall outside of her church. She hit her chin, no LOC, mild tenderness to palpation, no bruising on face. No focal neuro deficits. This appears to have been a mechanical fall. Also complains of knee pain and neck pain s/p surgery. XR negative for acute pathology. Bacitracin was placed on abrasion and dressing applied. Given pain medication. Follow up with your PCP next week. Return instructions given. Vital signs stable for discharge. Patient / Family / Caregiver informed of clinical course, understand medical decision-making process, and agree with plan.      I personally performed the services described in this documentation, which was scribed in my presence. The recorded information has been reviewed and is accurate.     Mora Bellman, PA-C 11/27/12 1127

## 2012-11-26 NOTE — ED Notes (Signed)
Pt states she tripped over something while walking out of church, fell to ground hitting L knee on pavement, pt c/o L knee pain, chin and neck pain. Pt denies LOC

## 2012-11-27 MED ORDER — PROMETHAZINE HCL 25 MG PO TABS: 25.0000 mg | ORAL_TABLET | Freq: Four times a day (QID) | ORAL | Status: DC | PRN

## 2012-11-27 MED ORDER — HYDROCODONE-ACETAMINOPHEN 5-325 MG PO TABS: 1.0000 | ORAL_TABLET | Freq: Four times a day (QID) | ORAL | Status: DC | PRN

## 2012-11-27 MED ORDER — BACITRACIN ZINC 500 UNIT/GM EX OINT
1.0000 "application " | TOPICAL_OINTMENT | Freq: Two times a day (BID) | CUTANEOUS | Status: DC
  Administered 2012-11-27: 1 via TOPICAL
  Filled 2012-11-27: qty 0.9

## 2012-11-27 NOTE — ED Notes (Signed)
Patient transported to X-ray 

## 2012-11-27 NOTE — Discharge Instructions (Signed)
Abrasions  An abrasion is a cut or scrape of the skin. Abrasions do not go through all layers of the skin.  HOME CARE   If a bandage (dressing) was put on your wound, change it as told by your doctor. If the bandage sticks, soak it off with warm.   Wash the area with water and soap 2 times a day. Rinse off the soap in a sink or under a bath or shower faucet. Pat the area dry with a clean towel.   Put on medicated cream (ointment) as told by your doctor.   Change your bandage right away if it gets wet or dirty.   Only take medicine as told by your doctor.   See your doctor within 24 to 48 hours to get your wound checked.   Check your wound for redness, puffiness (swelling), or yellowish-white fluid (pus).  GET HELP RIGHT AWAY IF:    You have more pain in the wound.   You have redness, swelling, or tenderness around the wound.   You have pus coming from the wound.   You have a fever.   You have a bad smell coming from the wound or bandage.  MAKE SURE YOU:    Understand these instructions.   Will watch your condition.   Will get help right away if you are not doing well or get worse.  Document Released: 12/17/2007 Document Revised: 09/22/2011 Document Reviewed: 06/03/2011  [employer] Patient Information 2013 [employer].

## 2012-11-27 NOTE — ED Provider Notes (Signed)
Medical screening examination/treatment/procedure(s) were performed by non-physician practitioner and as supervising physician I was immediately available for consultation/collaboration.  Marylen Zuk, MD 11/27/12 2351 

## 2013-06-02 ENCOUNTER — Encounter: Payer: Self-pay | Admitting: Internal Medicine

## 2013-06-02 ENCOUNTER — Encounter: Payer: Self-pay | Admitting: *Deleted

## 2013-06-02 NOTE — Telephone Encounter (Signed)
error 

## 2013-06-14 ENCOUNTER — Other Ambulatory Visit: Payer: Self-pay | Admitting: Internal Medicine

## 2013-06-15 ENCOUNTER — Other Ambulatory Visit: Payer: Self-pay | Admitting: Internal Medicine

## 2013-06-16 ENCOUNTER — Telehealth: Payer: Self-pay | Admitting: Internal Medicine

## 2013-06-16 NOTE — Telephone Encounter (Signed)
Please call patient and advise it was denied at the pharmacy due to the fact she was given a 90 day supply thru mail order and should not be out of her medication yet.

## 2013-06-16 NOTE — Telephone Encounter (Signed)
[facility] [address], faxed  Refill request on Lunesta  Twice, pt called for status.  Please advise

## 2013-06-17 ENCOUNTER — Other Ambulatory Visit: Payer: Self-pay | Admitting: Emergency Medicine

## 2013-06-17 MED ORDER — ESZOPICLONE 2 MG PO TABS: 2.0000 mg | ORAL_TABLET | Freq: Every evening | ORAL | Status: DC | PRN

## 2013-06-17 NOTE — Telephone Encounter (Signed)
Pt called to check to see why [facility] hasnt [facility] yet, I advised pt of the Rx being denied due to a mail in Rx of 90 days.  pt said she is out of med & has not gotten a rx from [facility] in . Please Advise

## 2013-06-17 NOTE — Telephone Encounter (Signed)
Please advise her we had to call mail order to verify when she last received her RX since was documented in chart she had a 90 day supply and they informed us she has one on record but she did not request it. We will send in 30 day to pharmacy and she needs to call mail order and let them know she needs them to send the 90 day supply for 30 days from now.

## 2013-06-17 NOTE — Telephone Encounter (Signed)
Left message on patient's answering machine with apology for not refilling Lunesta Rx.  The refill that was faxed into [facility] was actually only placed on patient's profile and not refilled at that time per pt request.  Advised pt we will call in refill to [facility] [address].

## 2013-06-30 ENCOUNTER — Encounter: Payer: Self-pay | Admitting: Internal Medicine

## 2013-06-30 ENCOUNTER — Ambulatory Visit (INDEPENDENT_AMBULATORY_CARE_PROVIDER_SITE_OTHER): Payer: BC Managed Care – PPO | Admitting: Internal Medicine

## 2013-06-30 VITALS — BP 132/72 | HR 64 | Temp 98.0°F | Resp 16 | Ht 64.0 in | Wt 161.0 lb

## 2013-06-30 DIAGNOSIS — R7309 Other abnormal glucose: Secondary | ICD-10-CM | POA: Insufficient documentation

## 2013-06-30 DIAGNOSIS — R0989 Other specified symptoms and signs involving the circulatory and respiratory systems: Secondary | ICD-10-CM | POA: Insufficient documentation

## 2013-06-30 DIAGNOSIS — E785 Hyperlipidemia, unspecified: Secondary | ICD-10-CM | POA: Insufficient documentation

## 2013-06-30 DIAGNOSIS — G4700 Insomnia, unspecified: Secondary | ICD-10-CM | POA: Insufficient documentation

## 2013-06-30 DIAGNOSIS — E559 Vitamin D deficiency, unspecified: Secondary | ICD-10-CM | POA: Insufficient documentation

## 2013-06-30 DIAGNOSIS — E782 Mixed hyperlipidemia: Secondary | ICD-10-CM | POA: Insufficient documentation

## 2013-06-30 DIAGNOSIS — F411 Generalized anxiety disorder: Secondary | ICD-10-CM | POA: Insufficient documentation

## 2013-06-30 MED ORDER — ALPRAZOLAM 0.25 MG PO TABS: 0.2500 mg | ORAL_TABLET | Freq: Three times a day (TID) | ORAL | Status: AC | PRN

## 2013-06-30 MED ORDER — CITALOPRAM HYDROBROMIDE 20 MG PO TABS: 20.0000 mg | ORAL_TABLET | Freq: Every day | ORAL | Status: DC

## 2013-06-30 NOTE — Progress Notes (Signed)
Patient ID: Susan Carr, female   DOB: 04-Jan-1951, 62 y.o.   MRN: [id number]   This very nice 62 y.o.female presents for evaluation of acute anxiety and depression. She is also expectantly monitored for elevated BP,  Hyperlipidemia, Pre-Diabetes and Vitamin D Deficiency. At work yesterday, she apparently became very emotional and had a crying episode after reprimanded by a supervisor about her work [employer]. She comes in today reluctantly requesting some medication for anxiety and 'crying spells'. 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    BP has been controlled at home. Today's BP: 132/72 mmHg. Patient denies any cardiac type chest pain, palpitations, dyspnea/orthopnea/PND, dizziness, claudication, or dependent edema.   Hyperlipidemia is controlled with diet & meds. Last Cholesterol on September 29th  was  216 and LDL was 136 and she was recommended Tx with Zetia which she has not gotten. She also had suppressed TSH of 0.238 on September 29th and recheck on October 23 was 0.237 and she was to be scheduled for thyroid uptake and scan to r/o acute-subacute thyroiditis vs Grave's Disease.     Also, the patient has history of PreDiabetes/insulin resistance with A1c of 5.9% in October 2013, 5.7% in February and improving 5.3% in August of this year. Patient denies any symptoms of reactive hypoglycemia, diabetic polys, paresthesias or visual blurring.   Further, Patient has history of Vitamin D Deficiency (25 in 2008) with last vitamin D of 57. Patient not currently supplementing Vitamin D.  Medication Sig Dispense Refill  . celecoxib (CELEBREX) 100 MG capsule Take 100 mg by mouth daily as needed for pain.      . eszopiclone (LUNESTA) 2 MG TABS tablet Take 1 tablet (2 mg total) by mouth at bedtime as needed (sleep). Take immediately before bedtime  30 tablet  0  . fish  oil-omega-3 fatty acids 1000 MG capsule Take 1 g by mouth every morning.      Marland Kitchen HYDROcodone-acetaminophen (NORCO/VICODIN) 5-325 MG per tablet Take 1 tablet by mouth every 6 (six) hours as needed for pain.  6 tablet  0  . promethazine (PHENERGAN) 25 MG tablet Take 1 tablet (25 mg total) by mouth every 6 (six) hours as needed for nausea.  12 tablet  0  . vitamin E 100 UNIT capsule Take 100 Units by mouth every morning.         Allergies  Allergen Reactions  . Cymbalta [Duloxetine Hcl]     PMHx:   Past Medical History  Diagnosis Date  . Arthritis   . Hyperlipidemia   . Labile hypertension     FHx:    Reviewed / unchanged  SHx:    Reviewed / unchanged  Systems Review: 121 pt systems review essentially negative except as above  Filed Vitals:   06/30/13 1717  BP: 132/72  Pulse: 64  Temp: 98 F (36.7 C)  Resp: 16    Estimated body mass index is 27.62 kg/(m^2) as calculated from the following:   Height as of this encounter: 5\' 4"  (1.626 m).   Weight as of this encounter: 161 lb (73.029 kg).  On Exam: Appears well nourished - in no distress. Eyes: PERRLA, EOMs, conjunctiva no swelling or erythema. Sinuses: No frontal/maxillary tenderness ENT/Mouth: EAC's clear, TM's nl w/o erythema, bulging. Nares clear w/o erythema, swelling, exudates. Oropharynx clear without erythema or exudates. Oral hygiene is good. Tongue normal, non obstructing. Hearing  intact.  Neck: Supple. Thyroid nl. Car 2+/2+ without bruits, nodes or JVD. Chest: Respirations nl with BS clear & equal w/o rales, rhonchi, wheezing or stridor.  Cor: Heart sounds normal w/ regular rate and rhythm without sig. murmurs, gallops, clicks, or rubs. Peripheral pulses normal and equal  without edema.  Abdomen: Soft & bowel sounds normal. Non-tender w/o guarding, rebound, hernias, masses, or organomegaly.  Lymphatics: Unremarkable.  Musculoskeletal: Full ROM all peripheral extremities, joint stability, 5/5 strength, and normal  gait.  Skin: Warm, dry without exposed rashes, lesions, ecchymosis apparent.  Neuro: Cranial nerves intact, reflexes equal bilaterally. Sensory-motor testing grossly intact. Tendon reflexes grossly intact.  Pysch: Alert & oriented x 3. Insight and judgement nl & appropriate. No ideations.  Assessment and Plan:  1. Elevated BP, Hx/o  - Continue monitor blood pressures.   2. Hyperlipidemia - Continue diet & confirm if meds (Zetia) started. Continue monitor periodic cholesterol/liver & renal functions   3. Pre-diabetes/Insulin Resistance - Continue diet, exercise, lifestyle modifications. Monitor appropriate labs.  4. Vitamin D Deficiency - Continue supplementation.  5. Hyperthyroidism, r/o thyroiditis vs. Graves Dz.  6. Anxiety - mixed Depression - start Citalopram 20 mg qd and supplement with alprazolam o.25 mg tid until citalopram therapeutic- she has a f/u appt in ~ 3 weeks.  Recommended regular exercise, BP monitoring, weight control, and discussed med and SE's. Recommended labs to assess and monitor clinical status. Further disposition pending results of labs.

## 2013-06-30 NOTE — Patient Instructions (Signed)

## 2013-07-21 ENCOUNTER — Ambulatory Visit: Payer: Self-pay | Admitting: Internal Medicine

## 2013-07-25 ENCOUNTER — Other Ambulatory Visit: Payer: Self-pay | Admitting: Emergency Medicine

## 2013-07-25 MED ORDER — ESZOPICLONE 2 MG PO TABS: 2.0000 mg | ORAL_TABLET | Freq: Every evening | ORAL | Status: DC | PRN

## 2013-07-27 ENCOUNTER — Ambulatory Visit (INDEPENDENT_AMBULATORY_CARE_PROVIDER_SITE_OTHER): Payer: 59 | Admitting: Internal Medicine

## 2013-07-27 VITALS — BP 126/82 | HR 64 | Temp 97.7°F | Resp 18 | Wt 161.8 lb

## 2013-07-27 DIAGNOSIS — E782 Mixed hyperlipidemia: Secondary | ICD-10-CM

## 2013-07-27 DIAGNOSIS — R7309 Other abnormal glucose: Secondary | ICD-10-CM

## 2013-07-27 DIAGNOSIS — I10 Essential (primary) hypertension: Secondary | ICD-10-CM

## 2013-07-27 DIAGNOSIS — E559 Vitamin D deficiency, unspecified: Secondary | ICD-10-CM

## 2013-07-27 DIAGNOSIS — Z79899 Other long term (current) drug therapy: Secondary | ICD-10-CM

## 2013-07-27 LAB — BASIC METABOLIC PANEL WITH GFR
BUN: 16 mg/dL (ref 6–23)
CALCIUM: 9.8 mg/dL (ref 8.4–10.5)
CO2: 28 mEq/L (ref 19–32)
Chloride: 103 mEq/L (ref 96–112)
Creat: 0.85 mg/dL (ref 0.50–1.10)
GFR, EST AFRICAN AMERICAN: 85 mL/min
GFR, Est Non African American: 74 mL/min
GLUCOSE: 89 mg/dL (ref 70–99)
Potassium: 4.3 mEq/L (ref 3.5–5.3)
Sodium: 138 mEq/L (ref 135–145)

## 2013-07-27 LAB — CBC WITH DIFFERENTIAL/PLATELET
Basophils Absolute: 0 10*3/uL (ref 0.0–0.1)
Basophils Relative: 0 % (ref 0–1)
Eosinophils Absolute: 0.3 10*3/uL (ref 0.0–0.7)
Eosinophils Relative: 4 % (ref 0–5)
HCT: 41.2 % (ref 36.0–46.0)
HEMOGLOBIN: 14.1 g/dL (ref 12.0–15.0)
Lymphocytes Relative: 48 % — ABNORMAL HIGH (ref 12–46)
Lymphs Abs: 2.8 10*3/uL (ref 0.7–4.0)
MCH: 32.4 pg (ref 26.0–34.0)
MCHC: 34.2 g/dL (ref 30.0–36.0)
MCV: 94.7 fL (ref 78.0–100.0)
Monocytes Absolute: 0.4 10*3/uL (ref 0.1–1.0)
Monocytes Relative: 6 % (ref 3–12)
NEUTROS PCT: 42 % — AB (ref 43–77)
Neutro Abs: 2.4 10*3/uL (ref 1.7–7.7)
Platelets: 281 10*3/uL (ref 150–400)
RBC: 4.35 MIL/uL (ref 3.87–5.11)
RDW: 13.1 % (ref 11.5–15.5)
WBC: 5.9 10*3/uL (ref 4.0–10.5)

## 2013-07-27 LAB — HEPATIC FUNCTION PANEL
ALT: 14 U/L (ref 0–35)
AST: 22 U/L (ref 0–37)
Albumin: 4.2 g/dL (ref 3.5–5.2)
Alkaline Phosphatase: 54 U/L (ref 39–117)
BILIRUBIN INDIRECT: 0.4 mg/dL (ref 0.0–0.9)
Bilirubin, Direct: 0.1 mg/dL (ref 0.0–0.3)
TOTAL PROTEIN: 7.1 g/dL (ref 6.0–8.3)
Total Bilirubin: 0.5 mg/dL (ref 0.3–1.2)

## 2013-07-27 LAB — LIPID PANEL
Cholesterol: 201 mg/dL — ABNORMAL HIGH (ref 0–200)
HDL: 52 mg/dL (ref >39)
LDL Cholesterol: 116 mg/dL — ABNORMAL HIGH (ref 0–99)
Total CHOL/HDL Ratio: 3.9 Ratio
Triglycerides: 166 mg/dL — ABNORMAL HIGH (ref <150)
VLDL: 33 mg/dL (ref 0–40)

## 2013-07-27 LAB — MAGNESIUM: Magnesium: 2.1 mg/dL (ref 1.5–2.5)

## 2013-07-27 NOTE — Patient Instructions (Signed)
Continue Citalopram daily as discussed   May take Benadyl (Diphenhydramine) 25 mg  1 or 2 capsules at night with Lunesta to help with sleep   Hypertension As your heart beats, it forces blood through your arteries. This force is your blood pressure. If the pressure is too high, it is called hypertension (HTN) or high blood pressure. HTN is dangerous because you may have it and not know it. High blood pressure may mean that your heart has to work harder to pump blood. Your arteries may be narrow or stiff. The extra work puts you at risk for heart disease, stroke, and other problems.  Blood pressure consists of two numbers, a higher number over a lower, 110/72, for example. It is stated as "110 over 72." The ideal is below 120 for the top number (systolic) and under 80 for the bottom (diastolic). Write down your blood pressure today. You should pay close attention to your blood pressure if you have certain conditions such as:  Heart failure.  Prior heart attack.  Diabetes  Chronic kidney disease.  Prior stroke.  Multiple risk factors for heart disease. To see if you have HTN, your blood pressure should be measured while you are seated with your arm held at the level of the heart. It should be measured at least twice. A one-time elevated blood pressure reading (especially in the Emergency Department) does not mean that you need treatment. There may be conditions in which the blood pressure is different between your right and left arms. It is important to see your caregiver soon for a recheck. Most people have essential hypertension which means that there is not a specific cause. This type of high blood pressure may be lowered by changing lifestyle factors such as:  Stress.  Smoking.  Lack of exercise.  Excessive weight.  Drug/tobacco/alcohol use.  Eating less salt. Most people do not have symptoms from high blood pressure until it has caused damage to the body. Effective treatment  can often prevent, delay or reduce that damage. TREATMENT  When a cause has been identified, treatment for high blood pressure is directed at the cause. There are a large number of medications to treat HTN. These fall into several categories, and your caregiver will help you select the medicines that are best for you. Medications may have side effects. You should review side effects with your caregiver. If your blood pressure stays high after you have made lifestyle changes or started on medicines,   Your medication(s) may need to be changed.  Other problems may need to be addressed.  Be certain you understand your prescriptions, and know how and when to take your medicine.  Be sure to follow up with your caregiver within the time frame advised (usually within two weeks) to have your blood pressure rechecked and to review your medications.  If you are taking more than one medicine to lower your blood pressure, make sure you know how and at what times they should be taken. Taking two medicines at the same time can result in blood pressure that is too low. SEEK IMMEDIATE MEDICAL CARE IF:  You develop a severe headache, blurred or changing vision, or confusion.  You have unusual weakness or numbness, or a faint feeling.  You have severe chest or abdominal pain, vomiting, or breathing problems. MAKE SURE YOU:   Understand these instructions.  Will watch your condition.  Will get help right away if you are not doing well or get worse.  Diabetes and Exercise Exercising regularly is important. It is not just about losing weight. It has many health benefits, such as:  Improving your overall fitness, flexibility, and endurance.  Increasing your bone density.  Helping with weight control.  Decreasing your body fat.  Increasing your muscle strength.  Reducing stress and tension.  Improving your overall health. People with diabetes who exercise gain additional benefits because  exercise:  Reduces appetite.  Improves the body's use of blood sugar (glucose).  Helps lower or control blood glucose.  Decreases blood pressure.  Helps control blood lipids (such as cholesterol and triglycerides).  Improves the body's use of the hormone insulin by:  Increasing the body's insulin sensitivity.  Reducing the body's insulin needs.  Decreases the risk for heart disease because exercising:  Lowers cholesterol and triglycerides levels.  Increases the levels of good cholesterol (such as high-density lipoproteins [HDL]) in the body.  Lowers blood glucose levels. YOUR ACTIVITY PLAN  Choose an activity that you enjoy and set realistic goals. Your health care provider or diabetes educator can help you make an activity plan that works for you. You can break activities into 2 or 3 sessions throughout the day. Doing so is as good as one long session. Exercise ideas include:  Taking the dog for a walk.  Taking the stairs instead of the elevator.  Dancing to your favorite song.  Doing your favorite exercise with a friend. RECOMMENDATIONS FOR EXERCISING WITH TYPE 1 OR TYPE 2 DIABETES   Check your blood glucose before exercising. If blood glucose levels are greater than 240 mg/dL, check for urine ketones. Do not exercise if ketones are present.  Avoid injecting insulin into areas of the body that are going to be exercised. For example, avoid injecting insulin into:  The arms when playing tennis.  The legs when jogging.  Keep a record of:  Food intake before and after you exercise.  Expected peak times of insulin action.  Blood glucose levels before and after you exercise.  The type and amount of exercise you have done.  Review your records with your health care provider. Your health care provider will help you to develop guidelines for adjusting food intake and insulin amounts before and after exercising.  If you take insulin or oral hypoglycemic agents, watch  for signs and symptoms of hypoglycemia. They include:  Dizziness.  Shaking.  Sweating.  Chills.  Confusion.  Drink plenty of water while you exercise to prevent dehydration or heat stroke. Body water is lost during exercise and must be replaced.  Talk to your health care provider before starting an exercise program to make sure it is safe for you. Remember, almost any type of activity is better than none.    Cholesterol Cholesterol is a white, waxy, fat-like protein needed by your body in small amounts. The liver makes all the cholesterol you need. It is carried from the liver by the blood through the blood vessels. Deposits (plaque) may build up on blood vessel walls. This makes the arteries narrower and stiffer. Plaque increases the risk for heart attack and stroke. You cannot feel your cholesterol level even if it is very high. The only way to know is by a blood test to check your lipid (fats) levels. Once you know your cholesterol levels, you should keep a record of the test results. Work with your caregiver to to keep your levels in the desired range. WHAT THE RESULTS MEAN:  Total cholesterol is a rough measure of all  the cholesterol in your blood.  LDL is the so-called bad cholesterol. This is the type that deposits cholesterol in the walls of the arteries. You want this level to be low.  HDL is the good cholesterol because it cleans the arteries and carries the LDL away. You want this level to be high.  Triglycerides are fat that the body can either burn for energy or store. High levels are closely linked to heart disease. DESIRED LEVELS:  Total cholesterol below 200.  LDL below 100 for people at risk, below 70 for very high risk.  HDL above 50 is good, above 60 is best.  Triglycerides below 150. HOW TO LOWER YOUR CHOLESTEROL:  Diet.  Choose fish or white meat chicken and [place], roasted or baked. Limit fatty cuts of red meat, fried foods, and processed meats, such  as sausage and lunch meat.  Eat lots of fresh fruits and vegetables. Choose whole grains, beans, pasta, potatoes and cereals.  Use only small amounts of olive, corn or canola oils. Avoid butter, mayonnaise, shortening or palm kernel oils. Avoid foods with trans-fats.  Use skim/nonfat milk and low-fat/nonfat yogurt and cheeses. Avoid whole milk, cream, ice cream, egg yolks and cheeses. Healthy desserts include angel food cake, ginger snaps, animal crackers, hard candy, popsicles, and low-fat/nonfat frozen yogurt. Avoid pastries, cakes, pies and cookies.  Exercise.  A regular program helps decrease LDL and raises HDL.  Helps with weight control.  Do things that increase your activity level like gardening, walking, or taking the stairs.  Medication.  May be prescribed by your caregiver to help lowering cholesterol and the risk for heart disease.  You may need medicine even if your levels are normal if you have several risk factors. HOME CARE INSTRUCTIONS   Follow your diet and exercise programs as suggested by your caregiver.  Take medications as directed.  Have blood work done when your caregiver feels it is necessary. MAKE SURE YOU:   Understand these instructions.  Will watch your condition.  Will get help right away if you are not doing well or get worse.      Vitamin D Deficiency Vitamin D is an important vitamin that your body needs. Having too little of it in your body is called a deficiency. A very bad deficiency can make your bones soft and can cause a condition called rickets.  Vitamin D is important to your body for different reasons, such as:   It helps your body absorb 2 minerals called calcium and phosphorus.  It helps make your bones healthy.  It may prevent some diseases, such as diabetes and multiple sclerosis.  It helps your muscles and heart. You can get vitamin D in several ways. It is a natural part of some foods. The vitamin is also added to some  dairy products and cereals. Some people take vitamin D supplements. Also, your body makes vitamin D when you are in the sun. It changes the sun's rays into a form of the vitamin that your body can use. CAUSES   Not eating enough foods that contain vitamin D.  Not getting enough sunlight.  Having certain digestive system diseases that make it hard to absorb vitamin D. These diseases include Crohn's disease, chronic pancreatitis, and cystic fibrosis.  Having a surgery in which part of the stomach or small intestine is removed.  Being obese. Fat cells pull vitamin D out of your blood. That means that obese people may not have enough vitamin D left in their  blood and in other body tissues.  Having chronic kidney or liver disease. RISK FACTORS Risk factors are things that make you more likely to develop a vitamin D deficiency. They include:  Being older.  Not being able to get outside very much.  Living in a nursing home.  Having had broken bones.  Having weak or thin bones (osteoporosis).  Having a disease or condition that changes how your body absorbs vitamin D.  Having dark skin.  Some medicines such as seizure medicines or steroids.  Being overweight or obese. SYMPTOMS Mild cases of vitamin D deficiency may not have any symptoms. If you have a very bad case, symptoms may include:  Bone pain.  Muscle pain.  Falling often.  Broken bones caused by a minor injury, due to osteoporosis. DIAGNOSIS A blood test is the best way to tell if you have a vitamin D deficiency. TREATMENT Vitamin D deficiency can be treated in different ways. Treatment for vitamin D deficiency depends on what is causing it. Options include:  Taking vitamin D supplements.  Taking a calcium supplement. Your caregiver will suggest what dose is best for you. HOME CARE INSTRUCTIONS  Take any supplements that your caregiver prescribes. Follow the directions carefully. Take only the suggested  amount.  Have your blood tested 2 months after you start taking supplements.  Eat foods that contain vitamin D. Healthy choices include:  Fortified dairy products, cereals, or juices. Fortified means vitamin D has been added to the food. Check the label on the package to be sure.  Fatty fish like salmon or trout.  Eggs.  Oysters.  Do not use a tanning bed.  Keep your weight at a healthy level. Lose weight if you need to.  Keep all follow-up appointments. Your caregiver will need to perform blood tests to make sure your vitamin D deficiency is going away. SEEK MEDICAL CARE IF:  You have any questions about your treatment.  You continue to have symptoms of vitamin D deficiency.  You have nausea or vomiting.  You are constipated.  You feel confused.  You have severe abdominal or back pain. MAKE SURE YOU:  Understand these instructions.  Will watch your condition.  Will get help right away if you are not doing well or get worse.

## 2013-07-27 NOTE — Progress Notes (Signed)
Patient ID: Susan Carr, female   DOB: Jan 31, 1951, 63 y.o.   MRN: [id number]   This very nice 63 y.o. female presents for 3 month follow up with Hypertension, Hyperlipidemia, Pre-Diabetes and Vitamin D Deficiency. She was seen 1 month ago with acute anxiety and Rx'd Citalopram which se started , then stopped and only restarted it again 3-4 days ago . She does state her anxiety is better, but she c/o insomnia esp. Since her Lunesta dose was decreased from 2 to 3 mg qhs.    BP is monitored expectantly as it was 142/80 and 148/84 in Sept 2014. Today's BP: 126/82 mmHg . Patient denies any cardiac type chest pain, palpitations, dyspnea/orthopnea/PND, dizziness, claudication, or dependent edema.   Hyperlipidemia is controlled with diet. Last Cholesterol was 216, Triglycerides were 57, HDL 69  and LDL 136 in Sept 2014. Patient denies myalgias or other med SE's.    Also, the patient has history of PreDiabetes in Oct 2014 with A1c 5.9 %. Patient denies any symptoms of reactive hypoglycemia, diabetic polys, paresthesias or visual blurring.   Further, Patient has history of Vitamin D Deficiency of 25 in 2008with last vitamin D of 57 in August 2014. Patient supplements vitamin D without any suspected side-effects.    Medication List   alendronate 70 MG tablet  Commonly known as:  FOSAMAX  Take 70 mg by mouth once a week. Take with a full glass of water on an empty stomach.     ALPRAZolam 0.25 MG tablet  Commonly known as:  XANAX  Take 1 tablet (0.25 mg total) by mouth 3 (three) times daily as needed for anxiety.     celecoxib 100 MG capsule  Commonly known as:  CELEBREX  Take 100 mg by mouth daily as needed for pain.     citalopram 20 MG tablet  Commonly known as:  CELEXA  Take 1 tablet (20 mg total) by mouth daily.     eszopiclone 2 MG Tabs tablet  Commonly known as:  LUNESTA  Take 1 tablet (2 mg total) by mouth at bedtime as needed (sleep). Take immediately before bedtime     fish  oil-omega-3 fatty acids 1000 MG capsule  Take 1 g by mouth every morning.     promethazine 25 MG tablet  Commonly known as:  PHENERGAN  Take 1 tablet (25 mg total) by mouth every 6 (six) hours as needed for nausea.     VITAMIN D PO  Take 5,000 Units by mouth daily.     vitamin E 100 UNIT capsule  Take 100 Units by mouth every morning.         Allergies  Allergen Reactions  . Cymbalta [Duloxetine Hcl]     PMHx:   Past Medical History  Diagnosis Date  . Arthritis   . Hyperlipidemia   . Labile hypertension     FHx:    Reviewed / unchanged  SHx:    Reviewed / unchanged  Systems Review: Constitutional: Denies fever, chills, wt changes, headaches, insomnia, fatigue, night sweats, change in appetite. Eyes: Denies redness, blurred vision, diplopia, discharge, itchy, watery eyes.  ENT: Denies discharge, congestion, post nasal drip, epistaxis, sore throat, earache, hearing loss, dental pain, tinnitus, vertigo, sinus pain, snoring.  CV: Denies chest pain, palpitations, irregular heartbeat, syncope, dyspnea, diaphoresis, orthopnea, PND, claudication, edema. Respiratory: denies cough, dyspnea, DOE, pleurisy, hoarseness, laryngitis, wheezing.  Gastrointestinal: Denies dysphagia, odynophagia, heartburn, reflux, water brash, abdominal pain or cramps, nausea, vomiting, bloating, diarrhea, constipation, hematemesis, melena, hematochezia,  or hemorrhoids. Genitourinary: Denies dysuria, frequency, urgency, nocturia, hesitancy, discharge, hematuria, flank pain. Musculoskeletal: Denies arthralgias, myalgias, stiffness, jt. swelling, pain, limp, strain/sprain.  Skin: Denies pruritus, rash, hives, warts, acne, eczema, change in skin lesion(s). Neuro: No weakness, tremor, incoordination, spasms, paresthesia, or pain. Psychiatric: Denies confusion, memory loss, or sensory loss. Endo: Denies change in weight, skin, hair change.  Heme/Lymph: No excessive bleeding, bruising, orenlarged lymph  nodes.  BP: 126/82  Pulse: 64  Temp: 97.7 F (36.5 C)  Resp: 18    Estimated body mass index is 27.76 kg/(m^2) as calculated from the following:   Height as of 06/30/13: 5\' 4"  (1.626 m).   Weight as of this encounter: 161 lb 12.8 oz (73.392 kg).  On Exam: Appears well nourished - in no distress. Eyes: PERRLA, EOMs, conjunctiva no swelling or erythema. Sinuses: No frontal/maxillary tenderness ENT/Mouth: EAC's clear, TM's nl w/o erythema, bulging. Nares clear w/o erythema, swelling, exudates. Oropharynx clear without erythema or exudates. Oral hygiene is good. Tongue normal, non obstructing. Hearing intact.  Neck: Supple. Thyroid nl. Car 2+/2+ without bruits, nodes or JVD. Chest: Respirations nl with BS clear & equal w/o rales, rhonchi, wheezing or stridor.  Cor: Heart sounds normal w/ regular rate and rhythm without sig. murmurs, gallops, clicks, or rubs. Peripheral pulses normal and equal  without edema.  Abdomen: Soft & bowel sounds normal. Non-tender w/o guarding, rebound, hernias, masses, or organomegaly.  Lymphatics: Unremarkable.  Musculoskeletal: Full ROM all peripheral extremities, joint stability, 5/5 strength, and normal gait.  Skin: Warm, dry without exposed rashes, lesions, ecchymosis apparent.  Neuro: Cranial nerves intact, reflexes equal bilaterally. Sensory-motor testing grossly intact. Tendon reflexes grossly intact.  Pysch: Alert & oriented x 3. Insight and judgement nl & appropriate. No ideations.  Assessment and Plan:  1. Hypertension - Continue monitor blood pressure at home. Continue diet same.  2. Hyperlipidemia - Continue diet, exercise,& lifestyle modifications. Continue monitor periodic cholesterol/liver & renal functions   3. Pre-diabetes - Continue diet, exercise, lifestyle modifications. Monitor appropriate labs..  4. Vitamin D Deficiency - Continue supplementation.  Recommended regular exercise, BP monitoring, weight control, and discussed med and  SE's. Recommended labs to assess and monitor clinical status. Further disposition pending results of labs.

## 2013-07-28 LAB — VITAMIN D 25 HYDROXY (VIT D DEFICIENCY, FRACTURES): Vit D, 25-Hydroxy: 50 ng/mL (ref 30–89)

## 2013-07-28 LAB — HEMOGLOBIN A1C
Hgb A1c MFr Bld: 6.1 % — ABNORMAL HIGH (ref <5.7)
MEAN PLASMA GLUCOSE: 128 mg/dL — AB (ref <117)

## 2013-07-28 LAB — INSULIN, FASTING: Insulin fasting, serum: 9 u[IU]/mL (ref 3–28)

## 2013-07-28 LAB — TSH: TSH: 0.445 u[IU]/mL (ref 0.350–4.500)

## 2013-07-29 ENCOUNTER — Telehealth: Payer: Self-pay | Admitting: *Deleted

## 2013-07-29 NOTE — Telephone Encounter (Signed)
Message copied by Emelda Brothers on Fri Jul 29, 2013  8:38 AM ------      Message from: Unk Pinto      Created: Fri Jul 29, 2013  8:08 AM       Chol 201 ok but bad LDL chol is 116 sl elevated - need stricter diet and avoid animal products like meat esp red meat and dairy esp cheese      A1c 6.1% in diabetic range (was 5.9% in Oct) - need stricter diet and weight loss - no sweets/candy or white stuff      Vit D 50 - low - on 5000 u/da -- recc increase to 2 cap (=10,000 u) 3 x wk on MWF and keep 1 cap the other 4 days ------

## 2013-08-09 ENCOUNTER — Encounter: Payer: Self-pay | Admitting: Internal Medicine

## 2013-08-09 ENCOUNTER — Ambulatory Visit (INDEPENDENT_AMBULATORY_CARE_PROVIDER_SITE_OTHER): Payer: BC Managed Care – PPO | Admitting: Internal Medicine

## 2013-08-09 VITALS — BP 142/80 | HR 72 | Temp 99.3°F | Resp 16 | Wt 163.6 lb

## 2013-08-09 DIAGNOSIS — J042 Acute laryngotracheitis: Secondary | ICD-10-CM

## 2013-08-09 MED ORDER — PREDNISONE 20 MG PO TABS
ORAL_TABLET | ORAL | Status: DC
Start: 2013-08-09 — End: 2013-11-28

## 2013-08-09 MED ORDER — AZITHROMYCIN 250 MG PO TABS: ORAL_TABLET | ORAL | Status: AC

## 2013-08-09 MED ORDER — HYDROCODONE-ACETAMINOPHEN 5-325 MG PO TABS: ORAL_TABLET | ORAL | Status: DC

## 2013-08-09 NOTE — Progress Notes (Signed)
   Subjective:    Patient ID: Susan Carr, female    DOB: 09-Jun-1951, 63 y.o.   MRN: [id number]  Sore Throat  This is a new problem. The current episode started in the past 7 days. The problem has been gradually worsening. The fever has been present for 3 to 4 days. The pain is mild. Associated symptoms include congestion, coughing, a hoarse voice and trouble swallowing. Pertinent negatives include no drooling, shortness of breath, stridor or swollen glands. She has tried gargles for the symptoms.  Sinusitis This is a new problem. The current episode started in the past 7 days. The problem has been gradually worsening since onset. The fever has been present for 3 to 4 days. The pain is mild. Associated symptoms include chills, congestion, coughing, a hoarse voice, sinus pressure and a sore throat. Pertinent negatives include no diaphoresis, shortness of breath, sneezing or swollen glands. Past treatments include oral decongestants. The treatment provided no relief.  Cough The cough is non-productive. Associated symptoms include chills, a fever, myalgias and a sore throat. Pertinent negatives include no rash, shortness of breath or wheezing. She has tried OTC cough suppressant for the symptoms. The treatment provided no relief. There is no history of asthma, bronchiectasis or COPD.    Review of Systems  Constitutional: Positive for fever and chills. Negative for diaphoresis.  HENT: Positive for congestion, hoarse voice, sinus pressure, sore throat, trouble swallowing and voice change. Negative for drooling and sneezing.   Eyes: Negative.   Respiratory: Positive for cough and chest tightness. Negative for choking, shortness of breath, wheezing and stridor.   Cardiovascular: Negative.   Musculoskeletal: Positive for arthralgias and myalgias.  Skin: Negative for pallor and rash.    Objective:   Physical Exam  Constitutional: She is oriented to person, place, and time.  HENT:  Right Ear:  External ear normal.  Left Ear: External ear normal.  Nose: Nose normal.  Mouth/Throat: Oropharynx is clear and moist. No oropharyngeal exudate.  Eyes: Conjunctivae and EOM are normal. Pupils are equal, round, and reactive to light. Right eye exhibits no discharge. Left eye exhibits no discharge.  Neck: Normal range of motion. Neck supple. No JVD present. No thyromegaly present.  Cardiovascular: Normal rate, regular rhythm and normal heart sounds.   No murmur heard. Pulmonary/Chest: No respiratory distress. She has no wheezes. She has rales. She exhibits no tenderness.  Speech hoarse and raspy  Lymphadenopathy:    She has no cervical adenopathy.  Neurological: She is alert and oriented to person, place, and time. No cranial nerve deficit.  Skin: No rash noted. No erythema.  Psychiatric: She has a normal mood and affect. Her behavior is normal.    Assessment & Plan:   1. Acute laryngotracheitis without mention of obstruction  Rx Z Pak & 1 RF Rx Prednisone 20 mg #22 Pulse/taper Rx Norco 5 # 50 1 q3-4h prn  Mist / Steam

## 2013-08-09 NOTE — Patient Instructions (Signed)
Laryngitis At the top of your windpipe is your voice box. It is the source of your voice. Inside your voice box are 2 bands of muscles called vocal cords. When you breathe, your vocal cords are relaxed and open so that air can get into the lungs. When you decide to say something, these cords come together and vibrate. The sound from these vibrations goes into your throat and comes out through your mouth as sound. Laryngitis is an inflammation of the vocal cords that causes hoarseness, cough, loss of voice, sore throat, and dry throat. Laryngitis can be temporary (acute) or long-term (chronic). Most cases of acute laryngitis improve with time.Chronic laryngitis lasts for more than 3 weeks. CAUSES Laryngitis can often be related to excessive smoking, talking, or yelling, as well as inhalation of toxic fumes and allergies. Acute laryngitis is usually caused by a viral infection, vocal strain, measles or mumps, or bacterial infections. Chronic laryngitis is usually caused by vocal cord strain, vocal cord injury, postnasal drip, growths on the vocal cords, or acid reflux. SYMPTOMS   Cough.  Sore throat.  Dry throat. RISK FACTORS  Respiratory infections.  Exposure to irritating substances, such as cigarette smoke, excessive amounts of alcohol, stomach acids, and workplace chemicals.  Voice trauma, such as vocal cord injury from shouting or speaking too loud. DIAGNOSIS  Your cargiver will perform a physical exam. During the physical exam, your caregiver will examine your throat. The most common sign of laryngitis is hoarseness. Laryngoscopy may be necessary to confirm the diagnosis of this condition. This procedure allows your caregiver to look into the larynx. HOME CARE INSTRUCTIONS  Drink enough fluids to keep your urine clear or pale yellow.  Rest until you no longer have symptoms or as directed by your caregiver.  Breathe in moist air.  Take all medicine as directed by your  caregiver.  Do not smoke.  Talk as little as possible (this includes whispering).  Write on paper instead of talking until your voice is back to normal.  Follow up with your caregiver if your condition has not improved after 10 days. SEEK MEDICAL CARE IF:   You have trouble breathing.  You cough up blood.  You have persistent fever.  You have increasing pain.  You have difficulty swallowing. MAKE SURE YOU:  Understand these instructions.  Will watch your condition.  Will get help right away if you are not doing well or get worse. Document Released: 06/30/2005 Document Revised: 09/22/2011 Document Reviewed: 09/05/2010 [facility] Patient Information 2014 [place], [place].   Bronchitis Bronchitis is inflammation of the airways that extend from the windpipe into the lungs (bronchi). The inflammation often causes mucus to develop, which leads to a cough. If the inflammation becomes severe, it may cause shortness of breath. CAUSES  Bronchitis may be caused by:   Viral infections.   Bacteria.   Cigarette smoke.   Allergens, pollutants, and other irritants.  SIGNS AND SYMPTOMS  The most common symptom of bronchitis is a frequent cough that produces mucus. Other symptoms include:  Fever.   Body aches.   Chest congestion.   Chills.   Shortness of breath.   Sore throat.  DIAGNOSIS  Bronchitis is usually diagnosed through a medical history and physical exam. Tests, such as chest X-rays, are sometimes done to rule out other conditions.  TREATMENT  You may need to avoid contact with whatever caused the problem (smoking, for example). Medicines are sometimes needed. These may include:  Antibiotics. These may be prescribed if  the condition is caused by bacteria.  Cough suppressants. These may be prescribed for relief of cough symptoms.   Inhaled medicines. These may be prescribed to help open your airways and make it easier for you to breathe.   Steroid  medicines. These may be prescribed for those with recurrent (chronic) bronchitis. HOME CARE INSTRUCTIONS  Get plenty of rest.   Drink enough fluids to keep your urine clear or pale yellow (unless you have a medical condition that requires fluid restriction). Increasing fluids may help thin your secretions and will prevent dehydration.   Only take over-the-counter or prescription medicines as directed by your health care provider.  Only take antibiotics as directed. Make sure you finish them even if you start to feel better.  Avoid secondhand smoke, irritating chemicals, and strong fumes. These will make bronchitis worse. If you are a smoker, quit smoking. Consider using nicotine gum or skin patches to help control withdrawal symptoms. Quitting smoking will help your lungs heal faster.   Put a cool-mist humidifier in your bedroom at night to moisten the air. This may help loosen mucus. Change the water in the humidifier daily. You can also run the hot water in your shower and sit in the bathroom with the door closed for 5 10 minutes.   Follow up with your health care provider as directed.   Wash your hands frequently to avoid catching bronchitis again or spreading an infection to others.  SEEK MEDICAL CARE IF: Your symptoms do not improve after 1 week of treatment.  SEEK IMMEDIATE MEDICAL CARE IF:  Your fever increases.  You have chills.   You have chest pain.   You have worsening shortness of breath.   You have bloody sputum.  You faint.  You have lightheadedness.  You have a severe headache.   You vomit repeatedly. MAKE SURE YOU:   Understand these instructions.  Will watch your condition.  Will get help right away if you are not doing well or get worse. Document Released: 06/30/2005 Document Revised: 04/20/2013 Document Reviewed: 02/22/2013 [facility] Patient Information 2014 [place].

## 2013-08-16 ENCOUNTER — Encounter: Payer: Self-pay | Admitting: Emergency Medicine

## 2013-08-29 ENCOUNTER — Other Ambulatory Visit: Payer: Self-pay | Admitting: Emergency Medicine

## 2013-08-29 MED ORDER — ESZOPICLONE 2 MG PO TABS: 2.0000 mg | ORAL_TABLET | Freq: Every evening | ORAL | Status: DC | PRN

## 2013-10-10 ENCOUNTER — Encounter: Payer: Self-pay | Admitting: Emergency Medicine

## 2013-10-20 ENCOUNTER — Encounter: Payer: Self-pay | Admitting: Emergency Medicine

## 2013-11-04 ENCOUNTER — Other Ambulatory Visit: Payer: Self-pay | Admitting: Emergency Medicine

## 2013-11-04 MED ORDER — SUCRALFATE 1 GM/10ML PO SUSP
1.0000 g | Freq: Four times a day (QID) | ORAL | Status: DC
Start: 2013-11-04 — End: 2014-06-30

## 2013-11-14 ENCOUNTER — Other Ambulatory Visit: Payer: Self-pay | Admitting: Emergency Medicine

## 2013-11-28 ENCOUNTER — Encounter: Payer: Self-pay | Admitting: Internal Medicine

## 2013-11-28 ENCOUNTER — Ambulatory Visit (INDEPENDENT_AMBULATORY_CARE_PROVIDER_SITE_OTHER): Payer: 59 | Admitting: Internal Medicine

## 2013-11-28 VITALS — BP 138/82 | HR 64 | Temp 97.8°F | Resp 16 | Wt 169.0 lb

## 2013-11-28 DIAGNOSIS — Z79899 Other long term (current) drug therapy: Secondary | ICD-10-CM | POA: Insufficient documentation

## 2013-11-28 DIAGNOSIS — B029 Zoster without complications: Secondary | ICD-10-CM

## 2013-11-28 MED ORDER — HYDROCODONE-ACETAMINOPHEN 5-325 MG PO TABS: ORAL_TABLET | ORAL | Status: DC

## 2013-11-28 MED ORDER — PREDNISONE 20 MG PO TABS
20.0000 mg | ORAL_TABLET | ORAL | Status: DC
Start: 2013-11-28 — End: 2013-12-21

## 2013-11-28 MED ORDER — ACYCLOVIR 800 MG PO TABS: ORAL_TABLET | ORAL | Status: DC

## 2013-11-28 NOTE — Patient Instructions (Signed)
Shingles Shingles (herpes zoster) is an infection that is caused by the same virus that causes chickenpox (varicella). The infection causes a painful skin rash and fluid-filled blisters, which eventually break open, crust over, and heal. It may occur in any area of the body, but it usually affects only one side of the body or face. The pain of shingles usually lasts about 1 month. However, some people with shingles may develop long-term (chronic) pain in the affected area of the body. Shingles often occurs many years after the person had chickenpox. It is more common:  In people older than 50 years.  In people with weakened immune systems, such as those with HIV, AIDS, or cancer.  In people taking medicines that weaken the immune system, such as transplant medicines.  In people under great stress. CAUSES  Shingles is caused by the varicella zoster virus (VZV), which also causes chickenpox. After a person is infected with the virus, it can remain in the person's body for years in an inactive state (dormant). To cause shingles, the virus reactivates and breaks out as an infection in a nerve root. The virus can be spread from person to person (contagious) through contact with open blisters of the shingles rash. It will only spread to people who have not had chickenpox. When these people are exposed to the virus, they may develop chickenpox. They will not develop shingles. Once the blisters scab over, the person is no longer contagious and cannot spread the virus to others. SYMPTOMS  Shingles shows up in stages. The initial symptoms may be pain, itching, and tingling in an area of the skin. This pain is usually described as burning, stabbing, or throbbing.In a few days or weeks, a painful red rash will appear in the area where the pain, itching, and tingling were felt. The rash is usually on one side of the body in a band or belt-like pattern. Then, the rash usually turns into fluid-filled blisters. They  will scab over and dry up in approximately 2 3 weeks. Flu-like symptoms may also occur with the initial symptoms, the rash, or the blisters. These may include:  Fever.  Chills.  Headache.  Upset stomach. DIAGNOSIS  Your caregiver will perform a skin exam to diagnose shingles. Skin scrapings or fluid samples may also be taken from the blisters. This sample will be examined under a microscope or sent to a lab for further testing. TREATMENT  There is no specific cure for shingles. Your caregiver will likely prescribe medicines to help you manage the pain, recover faster, and avoid long-term problems. This may include antiviral drugs, anti-inflammatory drugs, and pain medicines. HOME CARE INSTRUCTIONS   Take a cool bath or apply cool compresses to the area of the rash or blisters as directed. This may help with the pain and itching.   Only take over-the-counter or prescription medicines as directed by your caregiver.   Rest as directed by your caregiver.  Keep your rash and blisters clean with mild soap and cool water or as directed by your caregiver.  Do not pick your blisters or scratch your rash. Apply an anti-itch cream or numbing creams to the affected area as directed by your caregiver.  Keep your shingles rash covered with a loose bandage (dressing).  Avoid skin contact with:  Babies.   Pregnant women.   Children with eczema.   Elderly people with transplants.   People with chronic illnesses, such as leukemia or AIDS.   Wear loose-fitting clothing to help ease   the pain of material rubbing against the rash.  Keep all follow-up appointments with your caregiver.If the area involved is on your face, you may receive a referral for follow-up to a specialist, such as an eye doctor (ophthalmologist) or an ear, nose, and throat (ENT) doctor. Keeping all follow-up appointments will help you avoid eye complications, chronic pain, or disability.  SEEK IMMEDIATE MEDICAL  CARE IF:   You have facial pain, pain around the eye area, or loss of feeling on one side of your face.  You have ear pain or ringing in your ear.  You have loss of taste.  Your pain is not relieved with prescribed medicines.   Your redness or swelling spreads.   You have more pain and swelling.  Your condition is worsening or has changed.   You have a feveror persistent symptoms for more than 2 3 days.  You have a fever and your symptoms suddenly get worse. MAKE SURE YOU:  Understand these instructions.  Will watch your condition.  Will get help right away if you are not doing well or get worse. Document Released: 06/30/2005 Document Revised: 03/24/2012 Document Reviewed: 02/12/2012 [employer] Patient Information 2014 [employer].  

## 2013-11-28 NOTE — Progress Notes (Signed)
   Subjective:    Patient ID: Renaye Rakers, female    DOB: [birth year], 63 y.o.   MRN: [id number]  HPI Very nice 63 yo MBF presenting with a 2 & 1/2 day Hx/o a painful rash of the Lt anterolateral chest . Quality is burning and itching. Intensity is severe - waxing and waning.   Meds, Allergies, past Hx same  Review of Systems  Non contributory to above     Objective:   Physical Exam BP 138/82  Pulse 64  Temp(Src) 97.8 F (36.6 C)  Resp 16  Wt 169 lb (76.658 kg)  There is a classic raised rash of the Lt anterolateral chest wall in the T5-6 dermatome distribution.  Assessment & Plan:   1. Herpes zoster without mention of complication  - Rx Zostavax 800 mg # 50 - 5 caps /da - Rx Prednisone 20 mg # 20 taper - Rx Norco-5 #50 - prn  ROV 2-3 wks

## 2013-12-13 ENCOUNTER — Encounter: Payer: Self-pay | Admitting: Internal Medicine

## 2013-12-13 NOTE — Progress Notes (Signed)
Patient ID: Susan Carr, female   DOB: 05/20/1951, 63 y.o.   MRN: [id number]      [name]

## 2013-12-19 ENCOUNTER — Encounter: Payer: Self-pay | Admitting: Internal Medicine

## 2013-12-21 ENCOUNTER — Ambulatory Visit (INDEPENDENT_AMBULATORY_CARE_PROVIDER_SITE_OTHER): Payer: 59 | Admitting: Physician Assistant

## 2013-12-21 ENCOUNTER — Encounter: Payer: Self-pay | Admitting: Internal Medicine

## 2013-12-21 VITALS — BP 128/82 | HR 64 | Temp 97.7°F | Resp 16 | Ht 64.0 in | Wt 171.6 lb

## 2013-12-21 DIAGNOSIS — B029 Zoster without complications: Secondary | ICD-10-CM

## 2013-12-21 DIAGNOSIS — R21 Rash and other nonspecific skin eruption: Secondary | ICD-10-CM

## 2013-12-21 MED ORDER — CICLOPIROX OLAMINE 0.77 % EX CREA: TOPICAL_CREAM | Freq: Two times a day (BID) | CUTANEOUS | Status: DC

## 2013-12-21 NOTE — Progress Notes (Signed)
   Subjective:    Patient ID: Susan Carr, female    DOB: 03-Dec-1950, 63 y.o.   MRN: [id number]  HPI 63 y.o. female presents for follow up with shingles. She states that the rash has improved, there is no pain however she has diffuse scaly, rough macules/plaques on her chest and arm that are hyperpigmented.    Review of Systems  Constitutional: Negative.   HENT: Negative.   Respiratory: Negative.   Cardiovascular: Negative.   Gastrointestinal: Negative.   Genitourinary: Negative.   Musculoskeletal: Negative.   Skin: Positive for rash.  Neurological: Negative.        Objective:   Physical Exam  Constitutional: She is oriented to person, place, and time. She appears well-developed and well-nourished.  HENT:  Head: Normocephalic and atraumatic.  Eyes: Conjunctivae and EOM are normal. Pupils are equal, round, and reactive to light.  Neck: Normal range of motion. Neck supple.  Cardiovascular: Normal rate and regular rhythm.   Pulmonary/Chest: Effort normal and breath sounds normal.  Abdominal: Soft.  Neurological: She is alert and oriented to person, place, and time.  Skin: Skin is warm and dry. Rash (healing vesicles left anterior chest under breast with diffuse flat rough hyperpigmented patches along chest and arms. ) noted.      Assessment & Plan:  Zoster resolving ? Yeast/urticaria- will give Ciclopirox, cont benadryl.  Patient has follow up of CPE on 07/01

## 2013-12-21 NOTE — Patient Instructions (Signed)
Ciclopirox skin lotion or suspension What is this medicine? CICLOPIROX (sye kloe PEER ox) is an antifungal medicine. It is used to treat certain kinds of fungal or yeast infections of the skin. This medicine may be used for other purposes; ask your health care provider or pharmacist if you have questions. COMMON BRAND NAME(S): Loprox TS, Loprox What should I tell my health care provider before I take this medicine? They need to know if you have any of these conditions: -large areas of burned or damaged skin -an unusual or allergic reaction to ciclopirox, mineral oil, benzyl alcohol, other medicines, foods, dyes, or preservatives -pregnant or trying to get pregnant -breast-feeding How should I use this medicine? This medicine is for use on the skin only. Do not take by mouth. Do not get this medicine in your eyes. If you do, rinse out with plenty of cool tap water. Follow the directions on the prescription label. Wash your hands before and after use. If treating infections on the hand, only wash hands before use. Shake well before using. Apply a thin layer to the affected area and rub in gently. Do not use an airtight bandage to cover the affected area unless your doctor or health care professional tells you to. Use at regular intervals. Do not use your medicine more often than directed. Finish the full course prescribed by your doctor or health care professional even if you think you are better. Do not stop using except on your doctor's advice. Talk to your pediatrician regarding the use of this medicine in children. While this drug may be prescribed for children as young as 75 years of age or older for selected conditions, precautions do apply. Overdosage: If you think you have taken too much of this medicine contact a poison control center or emergency room at once. NOTE: This medicine is only for you. Do not share this medicine with others. What if I miss a dose? If you miss a dose, use it as  soon as you can. If it is almost time for your next dose, use only that dose. Do not use double or take extra doses. What may interact with this medicine? Interactions are not expected. Do not use any other skin products without telling your doctor or health care professional. This list may not describe all possible interactions. Give your health care provider a list of all the medicines, herbs, non-prescription drugs, or dietary supplements you use. Also tell them if you smoke, drink alcohol, or use illegal drugs. Some items may interact with your medicine. What should I watch for while using this medicine? Tell your doctor or health care professional if your symptoms do not improve within 2 to 4 weeks, or of they get worse. Tell your doctor or health care professional if you develop sores or blisters that do not heal properly. If your skin infection returns after you stop using this medicine, contact your doctor or health care professional. If you are using this medicine for jock itch, do not wear underwear that is tight-fitting or made from synthetic fibers such as rayon or nylon. Instead, wear loose-fitting, cotton underwear. Dry the area completely after bathing. If you are using this medicine to treat athlete's foot, carefully dry the feet, especially between the toes, after bathing. Do not wear socks made from wool or synthetic materials such as rayon or nylon. Wear clean cotton socks and change them daily or more if your feet sweat a lot. Try wearing sandals or shoes that are  well-ventilated. An absorbent powder such as talcum powder or an antifungal powder may be applied to the skin to keep it dry. Apply the powder to the affected skin in between applications of this medicine. Tinea versicolor (sun fungus) may cause you to have patches of skin that are lighter or darker than the surrounding skin areas. Treatment with this medicine will not restore normal skin coloring right away. It may take a few  months for the skin appearance to improve. What side effects may I notice from receiving this medicine? Side effects that you should report to your doctor or health care professional as soon as possible: -allergic reactions like skin rash, itching or hives, swelling of the face, lips, or tongue -skin peeling Side effects that usually do not require medical attention (report to your doctor or health care professional if they continue or are bothersome): -mild burning, stinging, itching, swelling, or other signs of skin irritation -redness of skin This list may not describe all possible side effects. Call your doctor for medical advice about side effects. You may report side effects to FDA at 1-800-FDA-1088. Where should I keep my medicine? Keep out of the reach of children. Store between 5 and 25 degrees C (41 and 77 degrees F). Do not freeze. Store away from heat and direct sunlight. Throw away any unused medicine after the expiration date. NOTE: This sheet is a summary. It may not cover all possible information. If you have questions about this medicine, talk to your doctor, pharmacist, or health care provider.  2014, [employer]. (2007-10-13 17:31:55)

## 2014-01-10 ENCOUNTER — Other Ambulatory Visit: Payer: Self-pay | Admitting: Emergency Medicine

## 2014-01-10 DIAGNOSIS — Z0000 Encounter for general adult medical examination without abnormal findings: Secondary | ICD-10-CM

## 2014-01-11 ENCOUNTER — Encounter: Payer: Self-pay | Admitting: Emergency Medicine

## 2014-01-11 ENCOUNTER — Ambulatory Visit (INDEPENDENT_AMBULATORY_CARE_PROVIDER_SITE_OTHER): Payer: 59 | Admitting: Emergency Medicine

## 2014-01-11 ENCOUNTER — Other Ambulatory Visit (HOSPITAL_COMMUNITY)
Admission: RE | Admit: 2014-01-11 | Discharge: 2014-01-11 | Disposition: A | Discharge status: Home / Self Care | Payer: 59 | Source: Ambulatory Visit | Attending: Obstetrics and Gynecology | Admitting: Obstetrics and Gynecology

## 2014-01-11 ENCOUNTER — Other Ambulatory Visit: Payer: Self-pay | Admitting: Obstetrics and Gynecology

## 2014-01-11 VITALS — BP 104/66 | HR 84 | Temp 97.9°F | Resp 16 | Ht 64.0 in | Wt 174.2 lb

## 2014-01-11 DIAGNOSIS — E782 Mixed hyperlipidemia: Secondary | ICD-10-CM

## 2014-01-11 DIAGNOSIS — M2550 Pain in unspecified joint: Secondary | ICD-10-CM

## 2014-01-11 DIAGNOSIS — Z0000 Encounter for general adult medical examination without abnormal findings: Secondary | ICD-10-CM

## 2014-01-11 DIAGNOSIS — Z01419 Encounter for gynecological examination (general) (routine) without abnormal findings: Secondary | ICD-10-CM | POA: Insufficient documentation

## 2014-01-11 DIAGNOSIS — R21 Rash and other nonspecific skin eruption: Secondary | ICD-10-CM

## 2014-01-11 DIAGNOSIS — R5383 Other fatigue: Secondary | ICD-10-CM

## 2014-01-11 DIAGNOSIS — Z1231 Encounter for screening mammogram for malignant neoplasm of breast: Secondary | ICD-10-CM

## 2014-01-11 DIAGNOSIS — R5381 Other malaise: Secondary | ICD-10-CM

## 2014-01-11 LAB — CBC WITH DIFFERENTIAL/PLATELET
BASOS ABS: 0 10*3/uL (ref 0.0–0.1)
Basophils Relative: 0 % (ref 0–1)
Eosinophils Absolute: 0.3 10*3/uL (ref 0.0–0.7)
Eosinophils Relative: 5 % (ref 0–5)
HEMATOCRIT: 39.5 % (ref 36.0–46.0)
Hemoglobin: 13.5 g/dL (ref 12.0–15.0)
Lymphocytes Relative: 41 % (ref 12–46)
Lymphs Abs: 2.1 10*3/uL (ref 0.7–4.0)
MCH: 31.5 pg (ref 26.0–34.0)
MCHC: 34.2 g/dL (ref 30.0–36.0)
MCV: 92.1 fL (ref 78.0–100.0)
Monocytes Absolute: 0.4 10*3/uL (ref 0.1–1.0)
Monocytes Relative: 8 % (ref 3–12)
Neutro Abs: 2.3 10*3/uL (ref 1.7–7.7)
Neutrophils Relative %: 46 % (ref 43–77)
Platelets: 268 10*3/uL (ref 150–400)
RBC: 4.29 MIL/uL (ref 3.87–5.11)
RDW: 13.7 % (ref 11.5–15.5)
WBC: 5 10*3/uL (ref 4.0–10.5)

## 2014-01-11 LAB — HEMOGLOBIN A1C
Hgb A1c MFr Bld: 6.2 % — ABNORMAL HIGH (ref <5.7)
Mean Plasma Glucose: 131 mg/dL — ABNORMAL HIGH (ref <117)

## 2014-01-11 MED ORDER — FLUCONAZOLE 150 MG PO TABS: 150.0000 mg | ORAL_TABLET | ORAL | Status: DC

## 2014-01-11 NOTE — Progress Notes (Signed)
Subjective:    Patient ID: Susan Carr, female    DOB: 02-28-1951, 63 y.o.   MRN: [id number]  HPI Comments: 63 yo pleasant AAF CPE and 3 month F/U for Labile HTN, Cholesterol, Pre-Dm, D. Deficient. She takes her cholesterol medicine sporadically. She notes cardio only with work. She is trying to eat healthier. LAST ABN LABS T 201 L 116 TG 166 A1C 6.1  She is concerned with chronic sleep problems. She notes difficulty falling asleep and staying asleep. She notes low dose [name] is not working. She notes Ambien higher dose worked better in past. SHe is unsure if she has had a sleep study in the past or not. She notes + fatigue with sleep issues.  She has chronic pain/ arthritis in hands, knees, elbows. She has seen orthopedic for knee with out continued relief. She notes pain is worse with movement especially in her hips and left arm. SHe denies chest pain. NEG [facility] labs 2014.  She notes rash on her chest back for over 2 weeks without relief. She has been trying topical cream. She denies any new exposures or stresses. She has chronic stress with husbands health but no recent changes.    Hip Pain   Arm Pain      Medication List       This list is accurate as of: 01/11/14 11:59 PM.  Always use your most recent med list.               acyclovir 800 MG tablet  Commonly known as:  ZOVIRAX  Take 1 tablet 3 x d with meals and 2 tablets at bedtime  (Toal of 5 tablets daily)     alendronate 70 MG tablet  Commonly known as:  FOSAMAX  Take 70 mg by mouth once a week. Take with a full glass of water on an empty stomach.     ALPRAZolam 0.25 MG tablet  Commonly known as:  XANAX  Take 1 tablet (0.25 mg total) by mouth 3 (three) times daily as needed for anxiety.     celecoxib 100 MG capsule  Commonly known as:  CELEBREX  Take 100 mg by mouth daily as needed for pain.     ciclopirox 0.77 % cream  Commonly known as:  LOPROX  Apply topically 2 (two) times daily.     citalopram 20 MG tablet  Commonly known as:  CELEXA  Take 1 tablet (20 mg total) by mouth daily.     eszopiclone 2 MG Tabs tablet  Commonly known as:  LUNESTA  TAKE 1 TABLET BY MOUTH AT BEDTIME AS NEEDED FOR SLEEP     fish oil-omega-3 fatty acids 1000 MG capsule  Take 1 g by mouth every morning.     fluconazole 150 MG tablet  Commonly known as:  DIFLUCAN  Take 1 tablet (150 mg total) by mouth once a week.     sucralfate 1 GM/10ML suspension  Commonly known as:  CARAFATE  Take 10 mLs (1 g total) by mouth 4 (four) times daily.     VITAMIN D PO  Take 5,000 Units by mouth daily.     vitamin E 100 UNIT capsule  Take 100 Units by mouth every morning.        Allergies  Allergen Reactions  . Cymbalta [Duloxetine Hcl]    Past Medical History  Diagnosis Date  . Arthritis   . Hyperlipidemia   . Labile hypertension    Past Surgical History  Procedure Laterality Date  .  Neck surgery     History  Substance Use Topics  . Smoking status: Never Smoker   . Smokeless tobacco: Not on file  . Alcohol Use: No   Family History  Problem Relation Age of Onset  . Heart attack Mother   . Diabetes Mother   . Heart attack Father   . Diabetes Other   . Multiple sclerosis Other     MAINTENANCE: Colonoscopy:2008 WNL due 2018 Mammo:01/16/13 BMD:10/22/11- Osteopenia due  Pap/ Pelvic:01/11/14 EYE: WNL per pt Dentist:  IMMUNIZATIONS: Tdap:2014 Pneumovax: Zostavax: Influenza:   Patient Care Team: Unk Pinto, MD as PCP - General (Internal Medicine) Barbaraann Cao, OD as Referring Physician (Optometry) Jeryl Columbia, MD as Consulting Physician (Gastroenterology) Maeola Sarah. Landry Mellow, MD as Consulting Physician (Obstetrics and Gynecology) Hessie Dibble, MD as Consulting Physician (Orthopedic Surgery) Alta Corning, MD as Consulting Physician (Orthopedic Surgery)    Review of Systems  Constitutional: Positive for fatigue.  Musculoskeletal: Positive for arthralgias.  Skin:  Positive for rash.  Psychiatric/Behavioral: Positive for sleep disturbance.  All other systems reviewed and are negative.  BP 104/66  Pulse 84  Temp(Src) 97.9 F (36.6 C)  Resp 16  Ht 5\' 4"  (1.626 m)  Wt 174 lb 3.2 oz (79.017 kg)  BMI 29.89 kg/m2     Objective:   Physical Exam  Nursing note and vitals reviewed. Constitutional: She is oriented to person, place, and time. She appears well-developed and well-nourished. No distress.  HENT:  Head: Normocephalic and atraumatic.  Right Ear: External ear normal.  Left Ear: External ear normal.  Nose: Nose normal.  Mouth/Throat: Oropharynx is clear and moist.  Eyes: Conjunctivae and EOM are normal. Pupils are equal, round, and reactive to light. Right eye exhibits no discharge. Left eye exhibits no discharge. No scleral icterus.  Neck: Normal range of motion. Neck supple. No JVD present. No tracheal deviation present. No thyromegaly present.  Cardiovascular: Normal rate, regular rhythm, normal heart sounds and intact distal pulses.   Pulmonary/Chest: Effort normal and breath sounds normal.  Abdominal: Soft. Bowel sounds are normal. She exhibits no distension and no mass. There is no tenderness. There is no rebound and no guarding.  Genitourinary:  Def gyn  Musculoskeletal: Normal range of motion. She exhibits no edema and no tenderness.  + crepitus knees, shoulders  Lymphadenopathy:    She has no cervical adenopathy.  Neurological: She is alert and oriented to person, place, and time. She has normal reflexes. No cranial nerve deficit. She exhibits normal muscle tone. Coordination normal.  Skin: Skin is warm and dry. No rash noted. No erythema. No pallor.  Erythematous, flat, splotchy rash over chest and back with out pattern.  Psychiatric: She has a normal mood and affect. Her behavior is normal. Judgment and thought content normal.      AORTA SCAN WNL EKG NSCSPT     Assessment & Plan:  1. CPE- Update screening labs/ History/  Immunizations/ Testing as needed. Advised healthy diet, QD exercise, increase H20 and continue RX/ Vitamins AD.   2. ? Autoimmune disorder w/ Arthralgias and rash- Review chart and ref rheum, advise patient to also f/u DERM  3. Insomnia/ Fatigue- check labs, increase activity and H2O, schedule sleep study if > 5 yr previous test, add Melatonin QHS  4. NONcompliant F/U for labile HTN, Cholesterol, Pre-Dm, D. Deficient. Needs healthy diet, cardio QD and obtain healthy weight. Check Labs, Check BP if >130/80 call office

## 2014-01-11 NOTE — Patient Instructions (Signed)
Insomnia Insomnia is frequent trouble falling and/or staying asleep. Insomnia can be a long term problem or a short term problem. Both are common. Insomnia can be a short term problem when the wakefulness is related to a certain stress or worry. Long term insomnia is often related to ongoing stress during waking hours and/or poor sleeping habits. Overtime, sleep deprivation itself can make the problem worse. Every little thing feels more severe because you are overtired and your ability to cope is decreased. CAUSES   Stress, anxiety, and depression.  Poor sleeping habits.  Distractions such as TV in the bedroom.  Naps close to bedtime.  Engaging in emotionally charged conversations before bed.  Technical reading before sleep.  Alcohol and other sedatives. They may make the problem worse. They can hurt normal sleep patterns and normal dream activity.  Stimulants such as caffeine for several hours prior to bedtime.  Pain syndromes and shortness of breath can cause insomnia.  Exercise late at night.  Changing time zones may cause sleeping problems (jet lag). It is sometimes helpful to have someone observe your sleeping patterns. They should look for periods of not breathing during the night (sleep apnea). They should also look to see how long those periods last. If you live alone or observers are uncertain, you can also be observed at a sleep clinic where your sleep patterns will be professionally monitored. Sleep apnea requires a checkup and treatment. Give your caregivers your medical history. Give your caregivers observations your family has made about your sleep.  SYMPTOMS   Not feeling rested in the morning.  Anxiety and restlessness at bedtime.  Difficulty falling and staying asleep. TREATMENT   Your caregiver may prescribe treatment for an underlying medical disorders. Your caregiver can give advice or help if you are using alcohol or other drugs for self-medication. Treatment  of underlying problems will usually eliminate insomnia problems.  Medications can be prescribed for short time use. They are generally not recommended for lengthy use.  Over-the-counter sleep medicines are not recommended for lengthy use. They can be habit forming.  You can promote easier sleeping by making lifestyle changes such as:  Using relaxation techniques that help with breathing and reduce muscle tension.  Exercising earlier in the day.  Changing your diet and the time of your last meal. No night time snacks.  Establish a regular time to go to bed.  Counseling can help with stressful problems and worry.  Soothing music and white noise may be helpful if there are background noises you cannot remove.  Stop tedious detailed work at least one hour before bedtime. HOME CARE INSTRUCTIONS   Keep a diary. Inform your caregiver about your progress. This includes any medication side effects. See your caregiver regularly. Take note of:  Times when you are asleep.  Times when you are awake during the night.  The quality of your sleep.  How you feel the next day. This information will help your caregiver care for you.  Get out of bed if you are still awake after 15 minutes. Read or do some quiet activity. Keep the lights down. Wait until you feel sleepy and go back to bed.  Keep regular sleeping and waking hours. Avoid naps.  Exercise regularly.  Avoid distractions at bedtime. Distractions include watching television or engaging in any intense or detailed activity like attempting to balance the household checkbook.  Develop a bedtime ritual. Keep a familiar routine of bathing, brushing your teeth, climbing into bed at the same   time each night, listening to soothing music. Routines increase the success of falling to sleep faster.  Use relaxation techniques. This can be using breathing and muscle tension release routines. It can also include visualizing peaceful scenes. You can  also help control troubling or intruding thoughts by keeping your mind occupied with boring or repetitive thoughts like the old concept of counting sheep. You can make it more creative like imagining planting one beautiful flower after another in your backyard garden.  During your day, work to eliminate stress. When this is not possible use some of the previous suggestions to help reduce the anxiety that accompanies stressful situations. MAKE SURE YOU:   Understand these instructions.  Will watch your condition.  Will get help right away if you are not doing well or get worse. Document Released: 06/27/2000 Document Revised: 09/22/2011 Document Reviewed: 07/28/2007 Exit[employer] Patient Information 2015 [employer]. This information is not intended to replace advice given to you by your health care provider. Make sure you discuss any questions you have with your health care provider.  

## 2014-01-12 LAB — IRON AND TIBC
%SAT: 48 % (ref 20–55)
Iron: 140 ug/dL (ref 42–145)
TIBC: 289 ug/dL (ref 250–470)
UIBC: 149 ug/dL (ref 125–400)

## 2014-01-12 LAB — BASIC METABOLIC PANEL WITH GFR
BUN: 15 mg/dL (ref 6–23)
CO2: 26 mEq/L (ref 19–32)
CREATININE: 0.8 mg/dL (ref 0.50–1.10)
Calcium: 9.3 mg/dL (ref 8.4–10.5)
Chloride: 105 mEq/L (ref 96–112)
GFR, Est African American: >89 mL/min
GFR, Est Non African American: 79 mL/min
Glucose, Bld: 93 mg/dL (ref 70–99)
Potassium: 4.2 mEq/L (ref 3.5–5.3)
Sodium: 141 mEq/L (ref 135–145)

## 2014-01-12 LAB — TSH: TSH: 1.052 u[IU]/mL (ref 0.350–4.500)

## 2014-01-12 LAB — LIPID PANEL
Cholesterol: 210 mg/dL — ABNORMAL HIGH (ref 0–200)
HDL: 57 mg/dL (ref >39)
LDL Cholesterol: 137 mg/dL — ABNORMAL HIGH (ref 0–99)
Total CHOL/HDL Ratio: 3.7 Ratio
Triglycerides: 80 mg/dL (ref <150)
VLDL: 16 mg/dL (ref 0–40)

## 2014-01-12 LAB — URINALYSIS, ROUTINE W REFLEX MICROSCOPIC
Bilirubin Urine: NEGATIVE
Glucose, UA: NEGATIVE mg/dL
Hgb urine dipstick: NEGATIVE
Ketones, ur: NEGATIVE mg/dL
Leukocytes, UA: NEGATIVE
Nitrite: NEGATIVE
PROTEIN: NEGATIVE mg/dL
Specific Gravity, Urine: 1.021 (ref 1.005–1.030)
UROBILINOGEN UA: 0.2 mg/dL (ref 0.0–1.0)
pH: 6 (ref 5.0–8.0)

## 2014-01-12 LAB — MAGNESIUM: Magnesium: 2 mg/dL (ref 1.5–2.5)

## 2014-01-12 LAB — VITAMIN D 25 HYDROXY (VIT D DEFICIENCY, FRACTURES): Vit D, 25-Hydroxy: 72 ng/mL (ref 30–89)

## 2014-01-12 LAB — HEPATIC FUNCTION PANEL
ALT: 15 U/L (ref 0–35)
AST: 22 U/L (ref 0–37)
Albumin: 4 g/dL (ref 3.5–5.2)
Alkaline Phosphatase: 38 U/L — ABNORMAL LOW (ref 39–117)
Bilirubin, Direct: 0.2 mg/dL (ref 0.0–0.3)
Indirect Bilirubin: 0.8 mg/dL (ref 0.2–1.2)
TOTAL PROTEIN: 6.5 g/dL (ref 6.0–8.3)
Total Bilirubin: 1 mg/dL (ref 0.2–1.2)

## 2014-01-12 LAB — VITAMIN B12: VITAMIN B 12: 539 pg/mL (ref 211–911)

## 2014-01-12 LAB — MICROALBUMIN / CREATININE URINE RATIO
CREATININE, URINE: 181.9 mg/dL
MICROALB UR: 0.5 mg/dL (ref 0.00–1.89)
Microalb Creat Ratio: 2.7 mg/g (ref 0.0–30.0)

## 2014-01-12 LAB — INSULIN, FASTING: Insulin fasting, serum: 14 u[IU]/mL (ref 3–28)

## 2014-01-12 LAB — FOLATE RBC: RBC Folate: 520 ng/mL (ref >280)

## 2014-01-16 ENCOUNTER — Ambulatory Visit
Admission: RE | Admit: 2014-01-16 | Discharge: 2014-01-16 | Disposition: A | Discharge status: Home / Self Care | Payer: 59 | Source: Ambulatory Visit | Attending: Obstetrics and Gynecology | Admitting: Obstetrics and Gynecology

## 2014-01-16 DIAGNOSIS — Z1231 Encounter for screening mammogram for malignant neoplasm of breast: Secondary | ICD-10-CM

## 2014-01-16 LAB — CYTOLOGY - PAP

## 2014-01-21 NOTE — Addendum Note (Signed)
Addended by: Ashanna Heinsohn A on: 01/21/2014 04:45 PM   Modules accepted: Orders

## 2014-01-21 NOTE — Addendum Note (Signed)
Addended by: Remell Giaimo A on: 01/21/2014 03:15 PM   Modules accepted: Orders

## 2014-01-28 ENCOUNTER — Other Ambulatory Visit: Payer: Self-pay | Admitting: Emergency Medicine

## 2014-02-14 ENCOUNTER — Other Ambulatory Visit: Payer: Self-pay | Admitting: Physician Assistant

## 2014-03-17 ENCOUNTER — Other Ambulatory Visit: Payer: Self-pay | Admitting: Emergency Medicine

## 2014-04-07 ENCOUNTER — Ambulatory Visit: Payer: Self-pay | Admitting: Internal Medicine

## 2014-04-17 ENCOUNTER — Other Ambulatory Visit: Payer: Self-pay | Admitting: *Deleted

## 2014-04-17 ENCOUNTER — Other Ambulatory Visit: Payer: Self-pay | Admitting: Internal Medicine

## 2014-04-17 MED ORDER — EZETIMIBE 10 MG PO TABS: 10.0000 mg | ORAL_TABLET | Freq: Every day | ORAL | Status: DC

## 2014-04-21 ENCOUNTER — Ambulatory Visit (HOSPITAL_BASED_OUTPATIENT_CLINIC_OR_DEPARTMENT_OTHER): Payer: 59 | Attending: Internal Medicine

## 2014-04-21 DIAGNOSIS — G4700 Insomnia, unspecified: Secondary | ICD-10-CM | POA: Insufficient documentation

## 2014-04-21 DIAGNOSIS — R0683 Snoring: Secondary | ICD-10-CM | POA: Diagnosis not present

## 2014-04-21 DIAGNOSIS — G4733 Obstructive sleep apnea (adult) (pediatric): Secondary | ICD-10-CM | POA: Diagnosis not present

## 2014-04-21 DIAGNOSIS — G4730 Sleep apnea, unspecified: Secondary | ICD-10-CM | POA: Diagnosis present

## 2014-04-21 DIAGNOSIS — G4719 Other hypersomnia: Secondary | ICD-10-CM

## 2014-04-28 ENCOUNTER — Other Ambulatory Visit: Payer: Self-pay | Admitting: *Deleted

## 2014-04-29 DIAGNOSIS — G4733 Obstructive sleep apnea (adult) (pediatric): Secondary | ICD-10-CM

## 2014-04-29 DIAGNOSIS — G4719 Other hypersomnia: Secondary | ICD-10-CM

## 2014-04-29 NOTE — Sleep Study (Signed)
   NAME: Susan Carr DATE OF BIRTH:  Jun 07, 1951 MEDICAL RECORD NUMBER [id number]  LOCATION: [facility]  PHYSICIAN: , D  DATE OF STUDY: 04/21/2014  SLEEP STUDY TYPE: Nocturnal Polysomnogram               REFERRING PHYSICIAN: Unk Pinto, MD  INDICATION FOR STUDY: Hypersomnia with sleep apnea  EPWORTH SLEEPINESS SCORE:  0/24 per patient HEIGHT:    5 feet 4 inches WEIGHT:    174 pounds   BMI: 30  NECK SIZE: 15 in.  MEDICATIONS:  charted for review   SLEEP ARCHITECTURE:  total sleep time 218.5 minutes with sleep efficiency 60.4%. Stage I was 7.1% stage II 68.6% stage III and absent REM 24.3% of total sleep time. Sleep latency 63.5 minutes, REM latency 84.5 minutes, awake after sleep onset 80.5 minutes, arousal index 5.8, bedtime medication: None   RESPIRATORY DATA:  Apnea hypopneas index (AHI) 6.9 per hour. 25 total events scored including 9 obstructive apneas and 16 hypopneas. Non-positional events. REM AHI 23.8 per hour. There were not enough early events to permit application of split protocol CPAP titration.   OXYGEN DATA: Loud snoring with oxygen desaturation to a nadir of 87% and mean saturation 95.3% on room air.   CARDIAC DATA: Normal sinus rhythm   MOVEMENT/PARASOMNIA:  no significant movement disturbance, bathroom x1  IMPRESSION/ RECOMMENDATION:   1) Patient had difficulty initiating sleep without her usual sleep medication. Sleep onset was shortly after midnight and she had intervals of wakefulness around 1 AM and between 2:15 and 3 AM. She says this is typical. 2) Mild obstructive sleep apnea/hypopneas syndrome, AHI 6.9 per hour with events in all positions. REM AHI 23.8 per hour. Loud snoring with oxygen desaturation to a nadir of 87% and mean saturation 95.3% on room air.      Deneise Lever Diplomate, American Board of Sleep Medicine  ELECTRONICALLY SIGNED ON:  04/29/2014, 9:02 AM [place] PH:  [phone]   FX: [phone] [place]

## 2014-05-02 ENCOUNTER — Telehealth: Payer: Self-pay

## 2014-05-02 NOTE — Telephone Encounter (Signed)
I have attempted to reach patient by phone several times, home # [phone], recording states mailbox is full, left a number for call back but no return call, cell number [phone] states number disconnected or changed, patients son on HIPPA form Janean Eischen in office today, I gave him report and instructions, he will give to patient and she will call with any questions.

## 2014-05-06 ENCOUNTER — Ambulatory Visit (HOSPITAL_COMMUNITY): Payer: 59 | Attending: Emergency Medicine

## 2014-05-06 ENCOUNTER — Encounter (HOSPITAL_COMMUNITY): Payer: Self-pay | Admitting: Emergency Medicine

## 2014-05-06 ENCOUNTER — Emergency Department (INDEPENDENT_AMBULATORY_CARE_PROVIDER_SITE_OTHER)
Admission: EM | Admit: 2014-05-06 | Discharge: 2014-05-06 | Disposition: A | Discharge status: Home / Self Care | Payer: Self-pay | Source: Home / Self Care | Attending: Emergency Medicine | Admitting: Emergency Medicine

## 2014-05-06 DIAGNOSIS — M5032 Other cervical disc degeneration, mid-cervical region: Secondary | ICD-10-CM | POA: Insufficient documentation

## 2014-05-06 DIAGNOSIS — S46011A Strain of muscle(s) and tendon(s) of the rotator cuff of right shoulder, initial encounter: Secondary | ICD-10-CM

## 2014-05-06 DIAGNOSIS — M25511 Pain in right shoulder: Secondary | ICD-10-CM | POA: Diagnosis present

## 2014-05-06 DIAGNOSIS — G43009 Migraine without aura, not intractable, without status migrainosus: Secondary | ICD-10-CM

## 2014-05-06 DIAGNOSIS — Z981 Arthrodesis status: Secondary | ICD-10-CM | POA: Insufficient documentation

## 2014-05-06 DIAGNOSIS — M542 Cervicalgia: Secondary | ICD-10-CM | POA: Diagnosis present

## 2014-05-06 DIAGNOSIS — M5031 Other cervical disc degeneration,  high cervical region: Secondary | ICD-10-CM | POA: Insufficient documentation

## 2014-05-06 DIAGNOSIS — S161XXA Strain of muscle, fascia and tendon at neck level, initial encounter: Secondary | ICD-10-CM

## 2014-05-06 MED ORDER — METOCLOPRAMIDE HCL 5 MG/ML IJ SOLN
INTRAMUSCULAR | Status: AC
  Filled 2014-05-06: qty 2

## 2014-05-06 MED ORDER — METOCLOPRAMIDE HCL 5 MG/ML IJ SOLN
5.0000 mg | Freq: Once | INTRAMUSCULAR | Status: AC
  Administered 2014-05-06: 5 mg via INTRAMUSCULAR

## 2014-05-06 MED ORDER — SUMATRIPTAN SUCCINATE 50 MG PO TABS: 50.0000 mg | ORAL_TABLET | ORAL | Status: DC | PRN

## 2014-05-06 MED ORDER — METHOCARBAMOL 500 MG PO TABS: 500.0000 mg | ORAL_TABLET | Freq: Three times a day (TID) | ORAL | Status: DC

## 2014-05-06 MED ORDER — KETOROLAC TROMETHAMINE 30 MG/ML IJ SOLN
INTRAMUSCULAR | Status: AC
  Filled 2014-05-06: qty 1

## 2014-05-06 MED ORDER — TRAMADOL HCL 50 MG PO TABS: 100.0000 mg | ORAL_TABLET | Freq: Three times a day (TID) | ORAL | Status: DC | PRN

## 2014-05-06 MED ORDER — DIPHENHYDRAMINE HCL 50 MG/ML IJ SOLN
25.0000 mg | Freq: Once | INTRAMUSCULAR | Status: AC
  Administered 2014-05-06: 25 mg via INTRAMUSCULAR

## 2014-05-06 MED ORDER — KETOROLAC TROMETHAMINE 60 MG/2ML IM SOLN
30.0000 mg | Freq: Once | INTRAMUSCULAR | Status: AC
  Administered 2014-05-06: 30 mg via INTRAMUSCULAR

## 2014-05-06 MED ORDER — DIPHENHYDRAMINE HCL 50 MG/ML IJ SOLN
INTRAMUSCULAR | Status: AC
  Filled 2014-05-06: qty 1
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
## 2014-05-06 NOTE — Discharge Instructions (Signed)
Most shoulder pain is caused by soft tissue problems rather than arthritis.  Rotator cuff tendonitis or tendonosis, rotator cuff tears, impingement syndrome and cartilege (labrum tears) are a few of the common causes of shoulder pain.  Fortunately, most of these can be treated with conservative measures as outlined below.  Do not do the following:  Doing any work with the arms above shoulder level (especially lifting) until the pain has subsided.  Sleeping on the affected side.  Especially avoid sleeping with your arm under your head or your pillow.  This is a habit that is hard to break.  Some people have to pin the arm of their pajamas to the chest area to prevent this.  Do the following:  Do the shoulder exercises below twice daily followed by ice for 10 minutes.  If no better in 1 month, follow up here, with your primary care doctor, or with an orthopedist.  Use of over the counter pain meds can be of help.  Tylenol (or acetaminophen) is the safest to use.  It often helps to take this regularly.  You can take up to 2 325 mg tablets 5 times daily, but it best to start out much lower that that, perhaps 2 325 mg tablets twice daily, then increase from there. People who are on the blood thinner warfarin have to be careful about taking high doses of Tylenol.  For people who are able to tolerate them, ibuprofen and Aleve can also help with the pain.  You should discuss these agents with your physician before taking them.  People with chronic kidney disease, hypertension, peptic ulcer disease, and reflux can suffer adverse side effects. They should not be taken with warfarin. The maximum dosage of ibuprofen is 800 mg 3 times daily with meals.  The maximum dosage of Aleve is 2 and 1/2 tablets twice daily with food, but again, start out low and gradually increase the dose until adequate pain relief is achieved. Ibuprofen and Aleve should always be taken with food.       TREATMENT  Treatment  initially involves the use of ice and medication to help reduce pain and inflammation. It is also important to perform strengthening and stretching exercises and modify activities that worsen symptoms so the injury does not get worse. These exercises may be performed at home or with a therapist. For patients who experience severe symptoms, a soft padded collar may be recommended to be worn around the neck.  Improving your posture may help reduce symptoms. Posture improvement includes pulling your chin and abdomen in while sitting or standing. If you are sitting, sit in a firm chair with your buttocks against the back of the chair. While sleeping, try replacing your pillow with a small towel rolled to 2 inches in diameter, or use a cervical pillow. Poor sleeping positions delay healing.   MEDICATION   If pain medication is necessary, nonsteroidal anti-inflammatory medications, such as aspirin and ibuprofen, or other minor pain relievers, such as acetaminophen, are often recommended.  Do not take pain medication for 7 days before surgery.  Prescription pain relievers may be given if deemed necessary by your caregiver. Use only as directed and only as much as you need.  HEAT AND COLD:   Cold treatment (icing) relieves pain and reduces inflammation. Cold treatment should be applied for 10 to 15 minutes every 2 to 3 hours for inflammation and pain and immediately after any activity that aggravates your symptoms. Use ice packs or an ice  massage.  Heat treatment may be used prior to performing the stretching and strengthening activities prescribed by your caregiver, physical therapist, or athletic trainer. Use a heat pack or a warm soak.  SEEK MEDICAL CARE IF:   Symptoms get worse or do not improve in 2 weeks despite treatment.  New, unexplained symptoms develop (drugs used in treatment may produce side effects).  EXERCISES RANGE OF MOTION (ROM) AND STRETCHING EXERCISES - Cervical Strain and  Sprain These exercises may help you when beginning to rehabilitate your injury. In order to successfully resolve your symptoms, you must improve your posture. These exercises are designed to help reduce the forward-head and rounded-shoulder posture which contributes to this condition. Your symptoms may resolve with or without further involvement from your physician, physical therapist or athletic trainer. While completing these exercises, remember:   Restoring tissue flexibility helps normal motion to return to the joints. This allows healthier, less painful movement and activity.  An effective stretch should be held for at least 20 seconds, although you may need to begin with shorter hold times for comfort.  A stretch should never be painful. You should only feel a gentle lengthening or release in the stretched tissue.  STRETCH- Axial Extensors  Lie on your back on the floor. You may bend your knees for comfort. Place a rolled up hand towel or dish towel, about 2 inches in diameter, under the part of your head that makes contact with the floor.  Gently tuck your chin, as if trying to make a "double chin," until you feel a gentle stretch at the base of your head.  Hold _____10_____ seconds. Repeat _____10_____ times. Complete this exercise _____2_____ times per day.   STRETECH - Axial Extension   Stand or sit on a firm surface. Assume a good posture: chest up, shoulders drawn back, abdominal muscles slightly tense, knees unlocked (if standing) and feet hip width apart.  Slowly retract your chin so your head slides back and your chin slightly lowers.Continue to look straight ahead.  You should feel a gentle stretch in the back of your head. Be certain not to feel an aggressive stretch since this can cause headaches later.  Hold for ____10______ seconds. Repeat _____10_____ times. Complete this exercise ____2______ times per day.  STRETCH  Cervical Side Bend   Stand or sit on a firm  surface. Assume a good posture: chest up, shoulders drawn back, abdominal muscles slightly tense, knees unlocked (if standing) and feet hip width apart.  Without letting your nose or shoulders move, slowly tip your right / left ear to your shoulder until your feel a gentle stretch in the muscles on the opposite side of your neck.  Hold _____10_____ seconds. Repeat _____10_____ times. Complete this exercise _____2_____ times per day.  STRETCH  Cervical Rotators   Stand or sit on a firm surface. Assume a good posture: chest up, shoulders drawn back, abdominal muscles slightly tense, knees unlocked (if standing) and feet hip width apart.  Keeping your eyes level with the ground, slowly turn your head until you feel a gentle stretch along the back and opposite side of your neck.  Hold _____10_____ seconds. Repeat ____10______ times. Complete this exercise ____2______ times per day.  RANGE OF MOTION - Neck Circles   Stand or sit on a firm surface. Assume a good posture: chest up, shoulders drawn back, abdominal muscles slightly tense, knees unlocked (if standing) and feet hip width apart.  Gently roll your head down and around from the back  of one shoulder to the back of the other. The motion should never be forced or painful.  Repeat the motion 10-20 times, or until you feel the neck muscles relax and loosen. Repeat ____10______ times. Complete the exercise _____2_____ times per day.  STRENGTHENING EXERCISES - Cervical Strain and Sprain These exercises may help you when beginning to rehabilitate your injury. They may resolve your symptoms with or without further involvement from your physician, physical therapist or athletic trainer. While completing these exercises, remember:   Muscles can gain both the endurance and the strength needed for everyday activities through controlled exercises.  Complete these exercises as instructed by your physician, physical therapist or athletic trainer.  Progress the resistance and repetitions only as guided.  You may experience muscle soreness or fatigue, but the pain or discomfort you are trying to eliminate should never worsen during these exercises. If this pain does worsen, stop and make certain you are following the directions exactly. If the pain is still present after adjustments, discontinue the exercise until you can discuss the trouble with your clinician.  STRENGTH Cervical Flexors, Isometric  Face a wall, standing about 6 inches away. Place a small pillow, a ball about 6-8 inches in diameter, or a folded towel between your forehead and the wall.  Slightly tuck your chin and gently push your forehead into the soft object. Push only with mild to moderate intensity, building up tension gradually. Keep your jaw and forehead relaxed.  Hold 10 to 20 seconds. Keep your breathing relaxed.  Release the tension slowly. Relax your neck muscles completely before you start the next repetition. Repeat _____10_____ times. Complete this exercise _____2_____ times per day.  STRENGTH- Cervical Lateral Flexors, Isometric   Stand about 6 inches away from a wall. Place a small pillow, a ball about 6-8 inches in diameter, or a folded towel between the side of your head and the wall.  Slightly tuck your chin and gently tilt your head into the soft object. Push only with mild to moderate intensity, building up tension gradually. Keep your jaw and forehead relaxed.  Hold 10 to 20 seconds. Keep your breathing relaxed.  Release the tension slowly. Relax your neck muscles completely before you start the next repetition. Repeat _____10_____ times. Complete this exercise ____2______ times per day.  STRENGTH  Cervical Extensors, Isometric   Stand about 6 inches away from a wall. Place a small pillow, a ball about 6-8 inches in diameter, or a folded towel between the back of your head and the wall.  Slightly tuck your chin and gently tilt your head back  into the soft object. Push only with mild to moderate intensity, building up tension gradually. Keep your jaw and forehead relaxed.  Hold 10 to 20 seconds. Keep your breathing relaxed.  Release the tension slowly. Relax your neck muscles completely before you start the next repetition. Repeat _____10_____ times. Complete this exercise _____2_____ times per day.  POSTURE AND BODY MECHANICS CONSIDERATIONS - Cervical Strain and Sprain Keeping correct posture when sitting, standing or completing your activities will reduce the stress put on different body tissues, allowing injured tissues a chance to heal and limiting painful experiences. The following are general guidelines for improved posture. Your physician or physical therapist will provide you with any instructions specific to your needs. While reading these guidelines, remember:  The exercises prescribed by your provider will help you have the flexibility and strength to maintain correct postures.  The correct posture provides the optimal environment for  your joints to work. All of your joints have less wear and tear when properly supported by a spine with good posture. This means you will experience a healthier, less painful body.  Correct posture must be practiced with all of your activities, especially prolonged sitting and standing. Correct posture is as important when doing repetitive low-stress activities (typing) as it is when doing a single heavy-load activity (lifting). PROLONGED STANDING WHILE SLIGHTLY LEANING FORWARD When completing a task that requires you to lean forward while standing in one place for a long time, place either foot up on a stationary 2-4 inch high object to help maintain the best posture. When both feet are on the ground, the low back tends to lose its slight inward curve. If this curve flattens (or becomes too large), then the back and your other joints will experience too much stress, fatigue more quickly and can  cause pain.  RESTING POSITIONS Consider which positions are most painful for you when choosing a resting position. If you have pain with flexion-based activities (sitting, bending, stooping, squatting), choose a position that allows you to rest in a less flexed posture. You would want to avoid curling into a fetal position on your side. If your pain worsens with extension-based activities (prolonged standing, working overhead), avoid resting in an extended position such as sleeping on your stomach. Most people will find more comfort when they rest with their spine in a more neutral position, neither too rounded nor too arched. Lying on a non-sagging bed on your side with a pillow between your knees, or on your back with a pillow under your knees will often provide some relief. Keep in mind, being in any one position for a prolonged period of time, no matter how correct your posture, can still lead to stiffness. WALKING Walk with an upright posture. Your ears, shoulders and hips should all line-up. OFFICE WORK When working at a desk, create an environment that supports good, upright posture. Without extra support, muscles fatigue and lead to excessive strain on joints and other tissues. CHAIR:  A chair should be able to slide under your desk when your back makes contact with the back of the chair. This allows you to work closely.  The chair's height should allow your eyes to be level with the upper part of your monitor and your hands to be slightly lower than your elbows.  Body position:  Your feet should make contact with the floor. If this is not possible, use a foot rest.  Keep your ears over your shoulders. This will reduce stress on your neck and low back. Document Released: 06/30/2005 Document Revised: 09/22/2011 Document Reviewed: 10/12/2008 [facility] Patient Information 2013 [place].

## 2014-05-06 NOTE — ED Notes (Signed)
Patient remains out of department.  Patient having xrays done at [facility]

## 2014-05-06 NOTE — ED Notes (Signed)
Attempted to call radiology x 2 to make them aware of patient's arrival without an answer

## 2014-05-12 ENCOUNTER — Ambulatory Visit (INDEPENDENT_AMBULATORY_CARE_PROVIDER_SITE_OTHER): Payer: 59 | Admitting: Internal Medicine

## 2014-05-12 ENCOUNTER — Encounter: Payer: Self-pay | Admitting: Internal Medicine

## 2014-05-12 VITALS — BP 128/64 | HR 82 | Temp 98.2°F | Resp 16 | Ht 64.0 in | Wt 170.0 lb

## 2014-05-12 DIAGNOSIS — M2662 Arthralgia of temporomandibular joint: Secondary | ICD-10-CM

## 2014-05-12 DIAGNOSIS — G44039 Episodic paroxysmal hemicrania, not intractable: Secondary | ICD-10-CM

## 2014-05-12 DIAGNOSIS — M26629 Arthralgia of temporomandibular joint, unspecified side: Secondary | ICD-10-CM

## 2014-05-12 MED ORDER — PREDNISONE 20 MG PO TABS: ORAL_TABLET | ORAL | Status: DC

## 2014-05-12 MED ORDER — BUTALBITAL-APAP-CAFFEINE 50-325-40 MG PO TABS: ORAL_TABLET | ORAL | Status: DC

## 2014-05-12 NOTE — Patient Instructions (Signed)
Temporomandibular Problems  Temporomandibular joint (TMJ) dysfunction means there are problems with the joint between your jaw and your skull. This is a joint lined by cartilage like other joints in your body but also has a small disc in the joint which keeps the bones from rubbing on each other. These joints are like other joints and can get inflamed (sore) from arthritis and other problems. When this joint gets sore, it can cause headaches and pain in the jaw and the face. CAUSES  Usually the arthritic types of problems are caused by soreness in the joint. Soreness in the joint can also be caused by overuse. This may come from grinding your teeth. It may also come from mis-alignment in the joint. DIAGNOSIS Diagnosis of this condition can often be made by history and exam. Sometimes your caregiver may need X-rays or an MRI scan to determine the exact cause. It may be necessary to see your dentist to determine if your teeth and jaws are lined up correctly. TREATMENT  Most of the time this problem is not serious; however, sometimes it can persist (become chronic). When this happens medications that will cut down on inflammation (soreness) help. Sometimes a shot of cortisone into the joint will be helpful. If your teeth are not aligned it may help for your dentist to make a splint for your mouth that can help this problem. If no physical problems can be found, the problem may come from tension. If tension is found to be the cause, biofeedback or relaxation techniques may be helpful. HOME CARE INSTRUCTIONS   Later in the day, applications of ice packs may be helpful. Ice can be used in a plastic bag with a towel around it to prevent frostbite to skin. This may be used about every 2 hours for 20 to 30 minutes, as needed while awake, or as directed by your caregiver.  Only take over-the-counter or prescription medicines for pain, discomfort, or fever as directed by your caregiver.    If physical therapy  was prescribed, follow your caregiver's directions.  Wear mouth appliances as directed if they were given.   Tension Headache A tension headache is a feeling of pain, pressure, or aching often felt over the front and sides of the head. The pain can be dull or can feel tight (constricting). It is the most common type of headache. Tension headaches are not normally associated with nausea or vomiting and do not get worse with physical activity. Tension headaches can last 30 minutes to several days.  CAUSES  The exact cause is not known, but it may be caused by chemicals and hormones in the brain that lead to pain. Tension headaches often begin after stress, anxiety, or depression. Other triggers may include:  Alcohol.  Caffeine (too much or withdrawal).  Respiratory infections (colds, flu, sinus infections).  Dental problems or teeth clenching.  Fatigue.  Holding your head and neck in one position too long while using a computer. SYMPTOMS   Pressure around the head.   Dull, aching head pain.   Pain felt over the front and sides of the head.   Tenderness in the muscles of the head, neck, and shoulders. DIAGNOSIS  A tension headache is often diagnosed based on:   Symptoms.   Physical examination.   A CT scan or MRI of your head. These tests may be ordered if symptoms are severe or unusual. TREATMENT  Medicines may be given to help relieve symptoms.  HOME CARE INSTRUCTIONS   Only  take over-the-counter or prescription medicines for pain or discomfort as directed by your caregiver.   Lie down in a dark, quiet room when you have a headache.   Keep a journal to find out what may be triggering your headaches. For example, write down:  What you eat and drink.  How much sleep you get.  Any change to your diet or medicines.  Try massage or other relaxation techniques.   Ice packs or heat applied to the head and neck can be used. Use these 3 to 4 times per day for 15  to 20 minutes each time, or as needed.   Limit stress.   Sit up straight, and do not tense your muscles.   Quit smoking if you smoke.  Limit alcohol use.  Decrease the amount of caffeine you drink, or stop drinking caffeine.  Eat and exercise regularly.  Get 7 to 9 hours of sleep, or as recommended by your caregiver.  Avoid excessive use of pain medicine as recurrent headaches can occur.  SEEK MEDICAL CARE IF:   You have problems with the medicines you were prescribed.  Your medicines do not work.  You have a change from the usual headache.  You have nausea or vomiting. SEEK IMMEDIATE MEDICAL CARE IF:   Your headache becomes severe.  You have a fever.  You have a stiff neck.  You have loss of vision.  You have muscular weakness or loss of muscle control.  You lose your balance or have trouble walking.  You feel faint or pass out.  You have severe symptoms that are different from your first symptoms. MAKE SURE YOU:   Understand these instructions.  Will watch your condition.  Will get help right away if you are not doing well or get worse. Document Released: 06/30/2005 Document Revised: 09/22/2011 Document Reviewed: 06/20/2011 [facility] Patient Information 2015 [employer], [place]. This information is not intended to replace advice given to you by your health care provider. Make sure you discuss any questions you have with your health care provider.
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
## 2014-05-19 ENCOUNTER — Ambulatory Visit: Payer: Self-pay | Admitting: Physician Assistant

## 2014-05-20 ENCOUNTER — Other Ambulatory Visit: Payer: Self-pay | Admitting: Physician Assistant

## 2014-05-25 ENCOUNTER — Encounter: Payer: Self-pay | Admitting: Physician Assistant

## 2014-05-25 ENCOUNTER — Ambulatory Visit (INDEPENDENT_AMBULATORY_CARE_PROVIDER_SITE_OTHER): Payer: 59 | Admitting: Physician Assistant

## 2014-05-25 VITALS — BP 118/70 | HR 94 | Temp 99.8°F | Resp 18 | Ht 64.0 in | Wt 163.0 lb

## 2014-05-25 DIAGNOSIS — N390 Urinary tract infection, site not specified: Secondary | ICD-10-CM

## 2014-05-25 DIAGNOSIS — R350 Frequency of micturition: Secondary | ICD-10-CM

## 2014-05-25 DIAGNOSIS — J209 Acute bronchitis, unspecified: Secondary | ICD-10-CM

## 2014-05-25 MED ORDER — ALBUTEROL SULFATE HFA 108 (90 BASE) MCG/ACT IN AERS: 2.0000 | INHALATION_SPRAY | RESPIRATORY_TRACT | Status: DC | PRN

## 2014-05-25 MED ORDER — BENZONATATE 100 MG PO CAPS: 200.0000 mg | ORAL_CAPSULE | Freq: Three times a day (TID) | ORAL | Status: DC | PRN

## 2014-05-25 MED ORDER — AZITHROMYCIN 250 MG PO TABS: ORAL_TABLET | ORAL | Status: DC

## 2014-05-25 MED ORDER — PREDNISONE 20 MG PO TABS: ORAL_TABLET | ORAL | Status: AC

## 2014-05-25 NOTE — Patient Instructions (Signed)
-   Rest and stay hydrated.  Make sure you drink plenty of fluids to make sure urine is clear when you urinate.  Water will help thin out mucous. -Take Albuterol as prescribed.- RX given- to help open airways. -Take Z-Pak as prescribed. -Take prednisone as prescribed to help inflammation. -Take Tessalon Perles as prescribed for cough.   Bronchitis is mostly caused by viruses and the antibiotic will do nothing.  However, you do have a fever and colored mucous, so Z-Pak was prescribed.  Please call the office or message through My Chart if you have any questions. If you are not feeling better in 10-14 days, then please call office.    Acute Bronchitis Bronchitis is when the airways that extend from the windpipe into the lungs get red, puffy, and painful (inflamed). Bronchitis often causes thick spit (mucus) to develop. This leads to a cough. A cough is the most common symptom of bronchitis. In acute bronchitis, the condition usually begins suddenly and goes away over time (usually in 2 weeks). Smoking, allergies, and asthma can make bronchitis worse. Repeated episodes of bronchitis may cause more lung problems.  Most common cause of Bronchitis is viruses (rhinovirus, coronavirus, RSV).  Therefore, not requiring an antibiotic; as antibiotics only treat bacterial infections.  HOME CARE  Rest.  Drink enough fluids to keep your pee (urine) clear or pale yellow (unless you need to limit fluids as told by your doctor).  Only take over-the-counter or prescription medicines as told by your doctor.  Avoid smoking and secondhand smoke. These can make bronchitis worse. If you are a smoker, think about using nicotine gum or skin patches. Quitting smoking will help your lungs heal faster.  Reduce the chance of getting bronchitis again by:  Washing your hands often.  Avoiding people with cold symptoms.  Trying not to touch your hands to your mouth, nose, or eyes.  Follow up with your doctor as  told. GET HELP IF: Your symptoms do not improve after 1 week of treatment. Symptoms include:  Cough.  Fever.  Coughing up thick spit.  Body aches.  Chest congestion.  Chills.  Shortness of breath.  Sore throat. GET HELP RIGHT AWAY IF:   You have an increased fever.  You have chills.  You have severe shortness of breath.  You have bloody thick spit (sputum).  You throw up (vomit) often.  You lose too much body fluid (dehydration).  You have a severe headache.  You faint. MAKE SURE YOU:   Understand these instructions.  Will watch your condition.  Will get help right away if you are not doing well or get worse. Document Released: 12/17/2007 Document Revised: 03/02/2013 Document Reviewed: 12/21/2012 [facility] Patient Information 2015 [place], [place]. This information is not intended to replace advice given to you by your health care provider. Make sure you discuss any questions you have with your health care provider.

## 2014-05-25 NOTE — Progress Notes (Addendum)
Subjective:    Patient ID: Susan Carr, female    DOB: [birth year], 63 y.o.   MRN: [id number]  Cough This is a new problem. Episode onset: started last tuesday. The problem has been gradually worsening. Episode frequency: come and goes. The cough is productive of brown sputum (dark green muscous in nose.). Associated symptoms include chest pain, chills, ear pain, a fever, headaches, nasal congestion, rhinorrhea, a sore throat, shortness of breath and wheezing. Pertinent negatives include no heartburn, hemoptysis, myalgias, postnasal drip, rash, sweats or weight loss. Associated symptoms comments: Chest pain with cough only. . Nothing aggravates the symptoms. Risk factors for lung disease include occupational exposure (co-workers at work have been sick.). Treatments tried: Mucinex and cold meds OTC. The treatment provided no relief.  Sore Throat  Maximum temperature: Does not check temp at home.  Just feels feverish. The pain is mild. Associated symptoms include abdominal pain, congestion, coughing, diarrhea, ear pain, headaches, a hoarse voice, a plugged ear sensation, shortness of breath and trouble swallowing. Pertinent negatives include no drooling, ear discharge, neck pain, stridor, swollen glands or vomiting.  Otalgia  There is pain in both ears. Associated symptoms include abdominal pain, coughing, diarrhea, headaches, rhinorrhea and a sore throat. Pertinent negatives include no ear discharge, neck pain, rash or vomiting.  Fever  Associated symptoms include abdominal pain, chest pain, congestion, coughing, diarrhea, ear pain, headaches, a sore throat and wheezing. Pertinent negatives include no nausea, rash, urinary pain or vomiting.   Review of Systems  Constitutional: Positive for fever, chills and fatigue. Negative for weight loss, diaphoresis and appetite change.  HENT: Positive for congestion, ear pain, hoarse voice, rhinorrhea, sore throat, trouble swallowing and voice change.  Negative for dental problem, drooling, ear discharge, postnasal drip and sinus pressure.   Eyes: Negative.   Respiratory: Positive for cough, shortness of breath and wheezing. Negative for hemoptysis and stridor.   Cardiovascular: Positive for chest pain. Negative for palpitations and leg swelling.       Chest Pain with cough only  Gastrointestinal: Positive for abdominal pain and diarrhea. Negative for heartburn, nausea, vomiting and constipation.       Lower abdominal pain.  Diarrhea comes and goes.  Genitourinary: Positive for frequency. Negative for dysuria, urgency, flank pain, vaginal discharge, difficulty urinating and pelvic pain.       Urinary incontinence once.  Musculoskeletal: Negative.  Negative for myalgias and neck pain.       Flank back pain  Skin: Negative.  Negative for rash.  Neurological: Positive for weakness and headaches. Negative for dizziness and light-headedness.  Psychiatric/Behavioral: Negative.    Past Medical History  Diagnosis Date  . Arthritis   . Hyperlipidemia   . Labile hypertension    Current Outpatient Prescriptions on File Prior to Visit  Medication Sig Dispense Refill  . acyclovir (ZOVIRAX) 800 MG tablet Take 1 tablet 3 x d with meals and 2 tablets at bedtime  (Toal of 5 tablets daily) 50 tablet 0  . alendronate (FOSAMAX) 70 MG tablet Take 1 tablet by mouth  every week 12 tablet 99  . ALPRAZolam (XANAX) 0.25 MG tablet Take 1 tablet (0.25 mg total) by mouth 3 (three) times daily as needed for anxiety. 90 tablet 1  . butalbital-acetaminophen-caffeine (FIORICET) 50-325-40 MG per tablet Take 1 or 2 tablets 4 x day if needed for headache 60 tablet 1  . celecoxib (CELEBREX) 100 MG capsule Take 100 mg by mouth daily as needed for pain.    . Cholecalciferol (VITAMIN  D PO) Take 5,000 Units by mouth daily.    . ciclopirox (LOPROX) 0.77 % cream Apply topically 2 (two) times daily. 30 g 2  . citalopram (CELEXA) 20 MG tablet Take 1 tablet (20 mg total) by mouth  daily. 90 tablet 99  . eszopiclone (LUNESTA) 2 MG TABS tablet (8/8) TAKE 1 TABLET AT BEDTIME AS NEEDED FOR SLEEP 30 tablet 2  . ezetimibe (ZETIA) 10 MG tablet Take 1 tablet (10 mg total) by mouth daily. 90 tablet 3  . fish oil-omega-3 fatty acids 1000 MG capsule Take 1 g by mouth every morning.    . methocarbamol (ROBAXIN) 500 MG tablet Take 1 tablet (500 mg total) by mouth 3 (three) times daily. 30 tablet 0  . sucralfate (CARAFATE) 1 GM/10ML suspension Take 10 mLs (1 g total) by mouth 4 (four) times daily. 420 mL 1  . SUMAtriptan (IMITREX) 50 MG tablet Take 1 tablet (50 mg total) by mouth every 2 (two) hours as needed for migraine or headache. May repeat in 2 hours if headache persists or recurs. 10 tablet 0  . traMADol (ULTRAM) 50 MG tablet Take 2 tablets (100 mg total) by mouth every 8 (eight) hours as needed. 30 tablet 0  . vitamin E 100 UNIT capsule Take 100 Units by mouth every morning.     No current facility-administered medications on file prior to visit.   Allergies  Allergen Reactions  . Cymbalta [Duloxetine Hcl]      BP 118/70 mmHg  Pulse 94  Temp(Src) 99.8 F (37.7 C) (Temporal)  Resp 18  Ht 5\' 4"  (1.626 m)  Wt 163 lb (73.936 kg)  BMI 27.97 kg/m2  SpO2 96% Wt Readings from Last 3 Encounters:  05/25/14 163 lb (73.936 kg)  05/12/14 170 lb (77.111 kg)  01/11/14 174 lb 3.2 oz (79.017 kg)   Objective:   Physical Exam  Constitutional: She is oriented to person, place, and time. She appears well-developed and well-nourished. She has a sickly appearance. No distress.  HENT:  Head: Normocephalic.  Right Ear: External ear and ear canal normal. No drainage or swelling. Tympanic membrane is injected. Tympanic membrane is not perforated, not erythematous, not retracted and not bulging.  Left Ear: External ear and ear canal normal. No drainage or swelling. Tympanic membrane is injected. Tympanic membrane is not perforated, not erythematous, not retracted and not bulging.  Nose:  Rhinorrhea present. No mucosal edema. Right sinus exhibits maxillary sinus tenderness. Right sinus exhibits no frontal sinus tenderness. Left sinus exhibits maxillary sinus tenderness. Left sinus exhibits no frontal sinus tenderness.  Mouth/Throat: Uvula is midline and mucous membranes are normal. Mucous membranes are not pale and not dry. No trismus in the jaw. No uvula swelling. Posterior oropharyngeal erythema present. No oropharyngeal exudate, posterior oropharyngeal edema or tonsillar abscesses.  Turbinates erythematous bilaterally.  Eyes: Conjunctivae and lids are normal. Pupils are equal, round, and reactive to light. Right eye exhibits no discharge. Left eye exhibits no discharge. No scleral icterus.  Neck: Trachea normal and normal range of motion. Neck supple. No tracheal deviation present.  Hoarse voice  Cardiovascular: Normal rate, regular rhythm, S1 normal, S2 normal, normal heart sounds and normal pulses.  Exam reveals no gallop, no distant heart sounds and no friction rub.   No murmur heard. Pulmonary/Chest: Effort normal. No stridor. No respiratory distress. She has no decreased breath sounds. She has wheezes in the right lower field and the left lower field. She has no rhonchi. She has no rales. She exhibits  no tenderness.  Abdominal: Soft. Bowel sounds are normal. She exhibits no distension and no mass. There is no hepatosplenomegaly. There is no tenderness. There is no rebound, no guarding and no CVA tenderness. No hernia.  Musculoskeletal: Normal range of motion.  Lymphadenopathy:  No LAD  Neurological: She is alert and oriented to person, place, and time. She has normal strength.  Skin: Skin is warm, dry and intact. No rash noted. She is not diaphoretic. No cyanosis. Nails show no clubbing.  Psychiatric: She has a normal mood and affect. Her speech is normal and behavior is normal. Judgment and thought content normal. Cognition and memory are normal.  Vitals reviewed.      Assessment & Plan:  1. Acute bronchitis, unspecified organism -Take the Z-Pak as prescribed- azithromycin (ZITHROMAX Z-PAK) 250 MG tablet; Take 2 tablets PO on day 1, then take 1 tablet PO QDaily for 4 days.  Dispense: 6 tablet; Refill: 0 -Take the Ventolin Inhaler as prescribed to help open up airways- albuterol (VENTOLIN HFA) 108 (90 BASE) MCG/ACT inhaler; Inhale 2 puffs into the lungs every 4 (four) hours as needed for wheezing or shortness of breath.  Dispense: 1 Inhaler; Refill: 0 - Take the Tessalon Perles as prescribed for cough- benzonatate (TESSALON PERLES) 100 MG capsule; Take 2 capsules (200 mg total) by mouth 3 (three) times daily as needed for cough.  Dispense: 120 capsule; Refill: 0 - Take Prednisone as prescribed to help with inflammation- predniSONE (DELTASONE) 20 MG tablet; Take 3 tablets PO QDaily for 3 days, then take 2 tablets PO QDaily for 3 days, then take 1 tablet PO QDaily for 3 days  Dispense: 18 tablet; Refill: 0 -Make sure you rest and stay hydrated.  Make sure you drink plenty of fluids to to help thin out mucous.  2. Urinary Frequency Ordered UA and Urine Culture.  Called patient at 1928 on 05/25/14 to come to office and leave urine sample.  Patient cleans office and will leave sample in lab area on counter tonight.  Told her to make sure she puts name and DOB on urine.  Will send out in morning to lab.  Will call pt with results. - Urinalysis, Routine w reflex microscopic - Urine culture  Discussed medication effects and SE's.  Pt agreed to treatment plan. If you are not feeling better in 10-14 days, then please call the office.  Addendum:  Urine culture came back positive with E Coli.  Will send in Nitrofurantoin 100mg  to take for 5 days with food for UTI.     Antawn Sison, Lise Auer, PA-C 12:09 PM [facility]

## 2014-05-26 ENCOUNTER — Other Ambulatory Visit: Payer: 59

## 2014-05-27 LAB — URINALYSIS, MICROSCOPIC ONLY
Casts: NONE SEEN
Crystals: NONE SEEN

## 2014-05-27 LAB — URINALYSIS, ROUTINE W REFLEX MICROSCOPIC
Bilirubin Urine: NEGATIVE
Glucose, UA: NEGATIVE mg/dL
Hgb urine dipstick: NEGATIVE
Ketones, ur: NEGATIVE mg/dL
LEUKOCYTES UA: NEGATIVE
Nitrite: POSITIVE — AB
PROTEIN: 30 mg/dL — AB
Specific Gravity, Urine: 1.027 (ref 1.005–1.030)
Urobilinogen, UA: 0.2 mg/dL (ref 0.0–1.0)
pH: 6 (ref 5.0–8.0)

## 2014-05-29 LAB — URINE CULTURE

## 2014-05-29 MED ORDER — NITROFURANTOIN MONOHYD MACRO 100 MG PO CAPS: 100.0000 mg | ORAL_CAPSULE | Freq: Two times a day (BID) | ORAL | Status: DC

## 2014-05-29 NOTE — Addendum Note (Signed)
Addended by: Charolette Forward on: 05/29/2014 12:53 PM   Modules accepted: Orders, SmartSet

## 2014-06-20 ENCOUNTER — Other Ambulatory Visit: Payer: Self-pay | Admitting: *Deleted

## 2014-06-20 MED ORDER — ESZOPICLONE 2 MG PO TABS: ORAL_TABLET | ORAL | Status: DC

## 2014-06-30 ENCOUNTER — Ambulatory Visit (INDEPENDENT_AMBULATORY_CARE_PROVIDER_SITE_OTHER): Payer: 59 | Admitting: Physician Assistant

## 2014-06-30 ENCOUNTER — Encounter: Payer: Self-pay | Admitting: Physician Assistant

## 2014-06-30 VITALS — BP 128/70 | HR 80 | Temp 98.2°F | Resp 16 | Ht 64.0 in | Wt 167.0 lb

## 2014-06-30 DIAGNOSIS — R0989 Other specified symptoms and signs involving the circulatory and respiratory systems: Secondary | ICD-10-CM

## 2014-06-30 DIAGNOSIS — E559 Vitamin D deficiency, unspecified: Secondary | ICD-10-CM

## 2014-06-30 DIAGNOSIS — I10 Essential (primary) hypertension: Secondary | ICD-10-CM

## 2014-06-30 DIAGNOSIS — R7309 Other abnormal glucose: Secondary | ICD-10-CM

## 2014-06-30 DIAGNOSIS — Z79899 Other long term (current) drug therapy: Secondary | ICD-10-CM

## 2014-06-30 DIAGNOSIS — E785 Hyperlipidemia, unspecified: Secondary | ICD-10-CM

## 2014-06-30 DIAGNOSIS — N390 Urinary tract infection, site not specified: Secondary | ICD-10-CM

## 2014-06-30 DIAGNOSIS — R7303 Prediabetes: Secondary | ICD-10-CM

## 2014-06-30 DIAGNOSIS — S96911A Strain of unspecified muscle and tendon at ankle and foot level, right foot, initial encounter: Secondary | ICD-10-CM

## 2014-06-30 LAB — CBC WITH DIFFERENTIAL/PLATELET
Basophils Absolute: 0 10*3/uL (ref 0.0–0.1)
Basophils Relative: 1 % (ref 0–1)
EOS PCT: 4 % (ref 0–5)
Eosinophils Absolute: 0.2 10*3/uL (ref 0.0–0.7)
HCT: 39.4 % (ref 36.0–46.0)
HEMOGLOBIN: 13.1 g/dL (ref 12.0–15.0)
Lymphocytes Relative: 39 % (ref 12–46)
Lymphs Abs: 1.8 10*3/uL (ref 0.7–4.0)
MCH: 31.9 pg (ref 26.0–34.0)
MCHC: 33.2 g/dL (ref 30.0–36.0)
MCV: 95.9 fL (ref 78.0–100.0)
MPV: 9.2 fL — AB (ref 9.4–12.4)
Monocytes Absolute: 0.3 10*3/uL (ref 0.1–1.0)
Monocytes Relative: 7 % (ref 3–12)
Neutro Abs: 2.2 10*3/uL (ref 1.7–7.7)
Neutrophils Relative %: 49 % (ref 43–77)
Platelets: 306 10*3/uL (ref 150–400)
RBC: 4.11 MIL/uL (ref 3.87–5.11)
RDW: 13.8 % (ref 11.5–15.5)
WBC: 4.5 10*3/uL (ref 4.0–10.5)

## 2014-06-30 LAB — LIPID PANEL
Cholesterol: 216 mg/dL — ABNORMAL HIGH (ref 0–200)
HDL: 53 mg/dL (ref >39)
LDL Cholesterol: 148 mg/dL — ABNORMAL HIGH (ref 0–99)
Total CHOL/HDL Ratio: 4.1 Ratio
Triglycerides: 76 mg/dL (ref <150)
VLDL: 15 mg/dL (ref 0–40)

## 2014-06-30 LAB — BASIC METABOLIC PANEL WITH GFR
BUN: 13 mg/dL (ref 6–23)
CO2: 29 meq/L (ref 19–32)
Calcium: 9.5 mg/dL (ref 8.4–10.5)
Chloride: 106 mEq/L (ref 96–112)
Creat: 0.88 mg/dL (ref 0.50–1.10)
GFR, Est African American: 81 mL/min
GFR, Est Non African American: 70 mL/min
GLUCOSE: 77 mg/dL (ref 70–99)
Potassium: 4 mEq/L (ref 3.5–5.3)
Sodium: 141 mEq/L (ref 135–145)

## 2014-06-30 LAB — HEPATIC FUNCTION PANEL
ALT: 12 U/L (ref 0–35)
AST: 19 U/L (ref 0–37)
Albumin: 4 g/dL (ref 3.5–5.2)
Alkaline Phosphatase: 42 U/L (ref 39–117)
BILIRUBIN DIRECT: 0.1 mg/dL (ref 0.0–0.3)
Indirect Bilirubin: 0.5 mg/dL (ref 0.2–1.2)
Total Bilirubin: 0.6 mg/dL (ref 0.2–1.2)
Total Protein: 6.3 g/dL (ref 6.0–8.3)

## 2014-06-30 NOTE — Patient Instructions (Addendum)
-continue medications as prescribed. -Ankle Strain- Rest, ice, brace, elevation.  Wear sneakers.  Please follow up in 3 months.   Ankle Sprain An ankle sprain is an injury to the strong, fibrous tissues (ligaments) that hold the bones of your ankle joint together.  CAUSES An ankle sprain is usually caused by a fall or by twisting your ankle. Ankle sprains most commonly occur when you step on the outer edge of your foot, and your ankle turns inward. People who participate in sports are more prone to these types of injuries.  SYMPTOMS   Pain in your ankle. The pain may be present at rest or only when you are trying to stand or walk.  Swelling.  Bruising. Bruising may develop immediately or within 1 to 2 days after your injury.  Difficulty standing or walking, particularly when turning corners or changing directions. DIAGNOSIS  Your caregiver will ask you details about your injury and perform a physical exam of your ankle to determine if you have an ankle sprain. During the physical exam, your caregiver will press on and apply pressure to specific areas of your foot and ankle. Your caregiver will try to move your ankle in certain ways. An X-ray exam may be done to be sure a bone was not broken or a ligament did not separate from one of the bones in your ankle (avulsion fracture).  TREATMENT  Certain types of braces can help stabilize your ankle. Your caregiver can make a recommendation for this. Your caregiver may recommend the use of medicine for pain. If your sprain is severe, your caregiver may refer you to a surgeon who helps to restore function to parts of your skeletal system (orthopedist) or a physical therapist. [place] ice to your injury for 1-2 days or as directed by your caregiver. Applying ice helps to reduce inflammation and pain.  Put ice in a plastic bag.  Place a towel between your skin and the bag.  Leave the ice on for 15-20 minutes at a time,  every 2 hours while you are awake.  Only take over-the-counter or prescription medicines for pain, discomfort, or fever as directed by your caregiver.  Elevate your injured ankle above the level of your heart as much as possible for 2-3 days.  If your caregiver recommends crutches, use them as instructed. Gradually put weight on the affected ankle. Continue to use crutches or a cane until you can walk without feeling pain in your ankle.  If you have a plaster splint, wear the splint as directed by your caregiver. Do not rest it on anything harder than a pillow for the first 24 hours. Do not put weight on it. Do not get it wet. You may take it off to take a shower or bath.  You may have been given an elastic bandage to wear around your ankle to provide support. If the elastic bandage is too tight (you have numbness or tingling in your foot or your foot becomes cold and blue), adjust the bandage to make it comfortable.  If you have an air splint, you may blow more air into it or let air out to make it more comfortable. You may take your splint off at night and before taking a shower or bath. Wiggle your toes in the splint several times per day to decrease swelling. SEEK MEDICAL CARE IF:   You have rapidly increasing bruising or swelling.  Your toes feel extremely cold or you lose feeling in  your foot.  Your pain is not relieved with medicine. SEEK IMMEDIATE MEDICAL CARE IF:  Your toes are numb or blue.  You have severe pain that is increasing. MAKE SURE YOU:   Understand these instructions.  Will watch your condition.  Will get help right away if you are not doing well or get worse. Document Released: 06/30/2005 Document Revised: 03/24/2012 Document Reviewed: 07/12/2011 [facility] Patient Information 2015 [place], [place]. This information is not intended to replace advice given to you by your health care provider. Make sure you discuss any questions you have with your health care  provider.   GIVE PT FOOD CHOICE LISTS FOR MEAL PLANNING  1)The amount of food you eat is important  -Too much can increase glucose levels and cause you to gain weight (which can  also increase glucose levels.  -Too little can decrease glucose level to unsafe levels (<70) 2)Eat meals and snacks at the same time each day to help your diabetes medication help you.  If you eat at different times each day, then the medication will not be as effective. 3)Do NOT skip meals or eat meals later than usual.  If you skip meals, then your glucose level can go low (<70).  Eating meals later than usual will not help the medication work effectively. 4) Amount of carbohydrates (carbs) per meal  -Breakfast- 30-45 grams of carbs  -Lunch and Dinner- 45-60 grams of carbs  -Snacks- 15-30 grams of carbs 5)Low fat foods have no more than 3 grams of fat per serving.  -Saturated- look for less than 1 gram per serving  -Trans Fat- look for 0 grams per serving 6)Exercise at least 120 minutes per week  Exercise Benefits:   -Lower LDL (Bad cholesterol)   -Lower blood pressure   -Increase HDL (Good cholesterol)   -Strengthen heart, lungs and muscles   -Burn calories and relieve stress   -Sleep better and help you feel better overall  To lower your risk of heart disease, limit your intake of saturated fat and trans fat as much as possible. THE GOOD WHAT IT DOES WHERE IT'S FOUND  MONOUNSATURATED FAT Lowers LDL and maybe raises HDL cholesterol Canola oil, olives, olive oil, peanuts, peanut oil, avocados, nuts  POLYUNSATURATED FAT Lowers LDL cholesterol Corn, safflower, sunflower and soybean oils, nuts, seeds  OMEGA-3 FATTY ACIDS Lowers triglycerides (blood fats) and blood pressure Salmon, mackerel, herring, sardines, flax seed, flaxseed oil, walnuts, soybean oil  THE BAD WHAT IT DOES WHERE IT'S FOUND  SATURATED FAT Raises LDL (bad) cholesterol Butter, shortening, lard, red meat, cheese, whole milk, ice cream, coconut  and palm oils  TRANS FAT Raises LDL cholesterol, lowers HDL (good) cholesterol Fried foods, some stick margarines, some cookies and crackers (look for hydrogenated fat on the ingredient list)  CHOLESTEROL FROM FOOD Too much may raise cholesterol levels Meat, poultry, seafood, eggs, milk, cheese, yogurt, butter   Plate Method (How much food of each food group) 1) Fill one half of your plate with nonstarchy vegetables: lettuce, broccoli, green beans, spinach, carrots or peppers. 2) Fill one quarter with protein: chicken, [place], fish, lean meat, eggs or tofu. 3) Fill one quarter with a nutritious carbohydrate food: brown rice, whole-wheat pasta, whole-wheat bread, peas, or corn.  Choose whole-wheat carbs for extra nutrition.  Controlling carbs helps you control your blood glucose. 4) Include a small piece of fruit at each meal, as well as 8 ounces of lowfat milk or yogurt. 5) Add 1-2 teaspoons of heart-healthy fat, such as  olive or canola oil, trans fat-free margarine, avocado, nuts or seeds.

## 2014-06-30 NOTE — Progress Notes (Signed)
Assessment and Plan:  1. Labile hypertension Monitor blood pressure at home.  Please let us know if BP >140/90.  Reminder to go to the ER if any CP, SOB, nausea, dizziness, severe HA, changes vision/speech, left arm numbness and tingling, and jaw pain. - CBC with Differential - BASIC METABOLIC PANEL WITH GFR - Hepatic function panel  2. Hyperlipidemia Continue Zetia and Fish Oil as prescribed.  Please follow recommended diet and exercise.  Check Cholesterol. - Lipid panel  3. Prediabetes Please follow recommended diet and exercise.  Check A1C and Insulin levels. - Hemoglobin A1c - Insulin, fasting  4. Vitamin D deficiency Continue Vitamin D 5,000 IU Daily.  Check Vitamin D Level. - Vit D  25 hydroxy (rtn osteoporosis monitoring)  5. Encounter for long-term (current) use of medications Will monitor kidney and liver function. - CBC with Differential - BASIC METABOLIC PANEL WITH GFR - Hepatic function panel  6. UTI (lower urinary tract infection) Ordered UA and urine culture to recheck urine due to recent UTI on 05/26/14 that was treated with Macrobid. - Urinalysis, Routine w reflex microscopic - Urine culture  7. Ankle Strain, Right, Initial Encounter Rest, Ice, Elevation and ace bandage for support.  Please try wearing sneakers with ace bandage.  Continue diet and meds as discussed. Further disposition pending results of labs. Discussed medication effects and SE's.  Pt agreed to treatment plan. Please follow up in 3 months.  HPI An African American 63 y.o. female  presents for 3 month follow up with labile hypertension, hyperlipidemia, prediabetes and vitamin D.  Patient recently had UTI on 05/26/14 that was treated with Macrobid.  Will recheck urine today.  Patient states she is having pain in her right ankle on the anterior surface.  She denies recent injury and first noticed it last weekend.  She states it hurts more when walking down stairs and the pain level is a 10 out of  10 during that activity.  Patient has no other questions or concerns at this time.  Her blood pressure has been controlled at home, today their BP is BP: 128/70 mmHg.  She does not workout. She denies chest pain, shortness of breath, dizziness.   She is on cholesterol medication (Zetia and Fish Oil) and denies myalgias. Her cholesterol is not at goal. The cholesterol last visit was:   Lab Results  Component Value Date   CHOL 210* 01/11/2014   HDL 57 01/11/2014   LDLCALC 137* 01/11/2014   TRIG 80 01/11/2014   CHOLHDL 3.7 01/11/2014   She has been working on diet and not exercise for prediabetes, and denies polydipsia and polyuria.  She does admit to increase in appetite.  Last A1C in the office was:  Lab Results  Component Value Date   HGBA1C 6.2* 01/11/2014  Currently manages prediabetes with Diet  Number of meals per day=  3   B- Grits and toast (wheat)  L- sandwich or soup  D- salmon and rice and salad  Snacks? Trail mix  Beverages?  Water and applejuice, blueberry/cranberry juice.  Patient is on Vitamin D supplement.- 5,000 IU dialy   Lab Results  Component Value Date   VD25OH 72 01/11/2014    Current Medications:  Current Outpatient Prescriptions on File Prior to Visit  Medication Sig Dispense Refill  . acyclovir (ZOVIRAX) 800 MG tablet Take 1 tablet 3 x d with meals and 2 tablets at bedtime  (Toal of 5 tablets daily) 50 tablet 0  . alendronate (FOSAMAX) 70 MG  tablet Take 1 tablet by mouth  every week 12 tablet 99  . butalbital-acetaminophen-caffeine (FIORICET) 50-325-40 MG per tablet Take 1 or 2 tablets 4 x day if needed for headache 60 tablet 1  . celecoxib (CELEBREX) 100 MG capsule Take 100 mg by mouth daily as needed for pain.    . Cholecalciferol (VITAMIN D PO) Take 5,000 Units by mouth daily.    . eszopiclone (LUNESTA) 2 MG TABS tablet (8/8) TAKE 1 TABLET AT BEDTIME AS NEEDED FOR SLEEP 90 tablet 0  . ezetimibe (ZETIA) 10 MG tablet Take 1 tablet (10 mg total) by  mouth daily. 90 tablet 3  . fish oil-omega-3 fatty acids 1000 MG capsule Take 1 g by mouth every morning.    . methocarbamol (ROBAXIN) 500 MG tablet Take 1 tablet (500 mg total) by mouth 3 (three) times daily. 30 tablet 0  . SUMAtriptan (IMITREX) 50 MG tablet Take 1 tablet (50 mg total) by mouth every 2 (two) hours as needed for migraine or headache. May repeat in 2 hours if headache persists or recurs. 10 tablet 0  . traMADol (ULTRAM) 50 MG tablet Take 2 tablets (100 mg total) by mouth every 8 (eight) hours as needed. 30 tablet 0  . vitamin E 100 UNIT capsule Take 100 Units by mouth every morning.    Marland Kitchen ALPRAZolam (XANAX) 0.25 MG tablet Take 1 tablet (0.25 mg total) by mouth 3 (three) times daily as needed for anxiety. (Patient not taking: Reported on 06/30/2014) 90 tablet 1  . citalopram (CELEXA) 20 MG tablet Take 1 tablet (20 mg total) by mouth daily. (Patient not taking: Reported on 06/30/2014) 90 tablet 99   No current facility-administered medications on file prior to visit.   Medical History:  Past Medical History  Diagnosis Date  . Arthritis   . Hyperlipidemia   . Labile hypertension    Allergies:  Allergies  Allergen Reactions  . Cymbalta [Duloxetine Hcl]     ROS- Review of Systems  Constitutional: Negative.   HENT: Negative.   Eyes: Negative.   Respiratory: Negative.   Cardiovascular: Negative.   Gastrointestinal: Negative.   Genitourinary: Negative.   Musculoskeletal: Negative.        Except for right ankle pain  Skin: Negative.   Neurological: Negative.   Psychiatric/Behavioral: Positive for depression. The patient is nervous/anxious.    Family history- Review and unchanged Social history- Review and unchanged  Physical Exam: BP 128/70 mmHg  Pulse 80  Temp(Src) 98.2 F (36.8 C) (Temporal)  Resp 16  Ht 5\' 4"  (1.626 m)  Wt 167 lb (75.751 kg)  BMI 28.65 kg/m2 Wt Readings from Last 3 Encounters:  06/30/14 167 lb (75.751 kg)  05/25/14 163 lb (73.936 kg)   05/12/14 170 lb (77.111 kg)  Vitals Reviewed. General Appearance: Well nourished, in no apparent distress.   Eyes: PERRLA, EOMs, conjunctiva no swelling or erythema.  No scleral icterus. Sinuses: No Frontal/maxillary tenderness ENT/Mouth: Ext aud canals clear, TMs without erythema, edema or bulging. No erythema, swelling, or exudate on posterior pharynx.  Tonsils not swollen or erythematous. Hearing normal.  Neck: Supple, thyroid normal.  Respiratory: Respiratory effort normal, CTAB.  No w/r/r or stridor.  Cardio: RRR with no M/R/Gs. Brisk peripheral pulses without edema.  Abdomen: Soft, + normal BS.  Non tender, no guarding, rebound, hernias, masses. Lymphatics: Non tender without lymphadenopathy.  Musculoskeletal: Full ROM, 5/5 strength, normal gait.  Tenderness upon palpation to right anterior ankle.  Tight tendon noticeable on anterior right ankle (possible  tibialis anterior tendon?).  No crepitus, edema or erythema to right ankle.  Skin: Warm, dry intact without rashes, lesions, ecchymosis.  Neuro: Cranial nerves intact. Normal muscle tone, no cerebellar symptoms. Sensation intact.  Psych: Awake and oriented X 3, normal affect, Insight and Judgment appropriate.     Bufford Helms, Stephani Police, PA-C 9:38 AM [facility]

## 2014-07-01 LAB — URINALYSIS, ROUTINE W REFLEX MICROSCOPIC
Bilirubin Urine: NEGATIVE
Glucose, UA: NEGATIVE mg/dL
Hgb urine dipstick: NEGATIVE
KETONES UR: NEGATIVE mg/dL
Leukocytes, UA: NEGATIVE
Nitrite: NEGATIVE
PROTEIN: NEGATIVE mg/dL
Specific Gravity, Urine: 1.017 (ref 1.005–1.030)
Urobilinogen, UA: 0.2 mg/dL (ref 0.0–1.0)
pH: 7 (ref 5.0–8.0)

## 2014-07-01 LAB — HEMOGLOBIN A1C
HEMOGLOBIN A1C: 6 % — AB (ref <5.7)
MEAN PLASMA GLUCOSE: 126 mg/dL — AB (ref <117)

## 2014-07-01 LAB — INSULIN, FASTING: Insulin fasting, serum: 4.9 u[IU]/mL (ref 2.0–19.6)

## 2014-07-01 LAB — VITAMIN D 25 HYDROXY (VIT D DEFICIENCY, FRACTURES): Vit D, 25-Hydroxy: 48 ng/mL (ref 30–100)

## 2014-07-02 LAB — URINE CULTURE
COLONY COUNT: NO GROWTH
Organism ID, Bacteria: NO GROWTH

## 2014-08-25 ENCOUNTER — Ambulatory Visit (INDEPENDENT_AMBULATORY_CARE_PROVIDER_SITE_OTHER): Payer: BLUE CROSS/BLUE SHIELD | Admitting: Internal Medicine

## 2014-08-25 ENCOUNTER — Encounter: Payer: Self-pay | Admitting: Internal Medicine

## 2014-08-25 VITALS — BP 118/70 | HR 64 | Temp 98.1°F | Resp 16 | Ht 64.0 in | Wt 163.6 lb

## 2014-08-25 DIAGNOSIS — J324 Chronic pansinusitis: Secondary | ICD-10-CM

## 2014-08-25 MED ORDER — PREDNISONE 20 MG PO TABS: ORAL_TABLET | ORAL | Status: DC

## 2014-08-25 MED ORDER — HYDROCODONE-ACETAMINOPHEN 5-325 MG PO TABS: ORAL_TABLET | ORAL | Status: AC

## 2014-08-25 MED ORDER — AZITHROMYCIN 250 MG PO TABS: ORAL_TABLET | ORAL | Status: DC

## 2014-08-27 NOTE — Progress Notes (Signed)
   Subjective:    Patient ID: Susan Carr, female    DOB: 1951/04/08, 64 y.o.   MRN: [id number]  HPI presents with a 1 -2 week hx/o head congestion, HA, PNDrainage w/o fever/chills or lower respiratory sx's.   Medication Sig  . acyclovir (ZOVIRAX) 800 MG tablet Take 1 tablet 3 x d with meals and 2 tablets at bedtime  (Toal of 5 tablets daily)  . alendronate (FOSAMAX) 70 MG tablet Take 1 tablet by mouth  every week  . celecoxib (CELEBREX) 100 MG capsule Take 100 mg by mouth daily as needed for pain.  . Cholecalciferol (VITAMIN D PO) Take 5,000 Units by mouth daily.  . eszopiclone (LUNESTA) 2 MG TABS tablet (8/8) TAKE 1 TABLET AT BEDTIME AS NEEDED FOR SLEEP  . ezetimibe (ZETIA) 10 MG tablet Take 1 tablet (10 mg total) by mouth daily.  . fish oil-omega-3 fatty acids 1000 MG capsule Take 1 g by mouth every morning.  . SUMAtriptan (IMITREX) 50 MG tablet Take 1 tablet (50 mg total) by mouth every 2 (two) hours as needed for migraine or headache. May repeat in 2 hours if headache persists or recurs.  . vitamin E 100 UNIT capsule Take 100 Units by mouth every morning.  . butalbital-acetaminophen-caffeine (FIORICET) 50-325-40 MG per tablet Take 1 or 2 tablets 4 x day if needed for headache (Patient not taking: Reported on 08/25/2014)  . citalopram (CELEXA) 20 MG tablet Take 1 tablet (20 mg total) by mouth daily. (Patient not taking: Reported on 06/30/2014)   Allergies  Allergen Reactions  . Cymbalta [Duloxetine Hcl]    Past Medical History  Diagnosis Date  . Arthritis   . Hyperlipidemia   . Labile hypertension    Past Surgical History  Procedure Laterality Date  . Neck surgery     Review of Systems 10 pt systems review negative to above.    Objective:   Physical Exam BP 118/70 mmHg  Pulse 64  Temp(Src) 98.1 F (36.7 C)  Resp 16  Ht 5\' 4"  (1.626 m)  Wt 163 lb 9.6 oz (74.208 kg)  BMI 28.07 kg/m2   HEENT - Eac's patent. TM's Nl. EOM's full. PERRLA. Bilat frontal/maxillary  tenderness. Nasal speech.  NasoOroPharynx clear. Neck - supple. Nl Thyroid. Carotids 2+ & No bruits, nodes, JVD Chest - Clear equal BS w/o Rales, rhonchi, wheezes. Cor - Nl HS. RRR w/o sig MGR. PP 1(+). No edema. MS- FROM w/o deformities. Muscle power, tone and bulk Nl. Gait Nl. Neuro - No obvious Cr N abnormalities. Sensory, motor and Cerebellar functions appear Nl w/o focal abnormalities. Psyche - Mental status normal & appropriate.  No delusions, ideations or obvious mood abnormalities.    Assessment & Plan:   1. Pansinusitis  - azithromycin (ZITHROMAX) 250 MG tablet; Take 2 tablets (500 mg) on  Day 1,  followed by 1 tablet (250 mg) once daily on Days 2 through 5.  Dispense: 6 each; Refill: 1 - HYDROcodone-acetaminophen (NORCO) 5-325 MG per tablet; Take 1/2 to 1 tablet every 3 to 4 hours if needed for cough or pain  Dispense: 50 tablet; Refill: 0 - predniSONE (DELTASONE) 20 MG tablet; 1 tab 3 x day for 3 days, then 1 tab 2 x day for 3 days, then 1 tab 1 x day for 5 days  Dispense: 20 tablet; Refill: 0  - discussed meds/SE's.

## 2014-09-11 ENCOUNTER — Other Ambulatory Visit: Payer: Self-pay | Admitting: *Deleted

## 2014-09-11 MED ORDER — ESZOPICLONE 2 MG PO TABS: ORAL_TABLET | ORAL | Status: DC

## 2014-09-29 ENCOUNTER — Ambulatory Visit: Payer: BLUE CROSS/BLUE SHIELD | Admitting: Internal Medicine

## 2014-09-29 ENCOUNTER — Encounter: Payer: Self-pay | Admitting: Internal Medicine

## 2014-09-30 NOTE — Progress Notes (Signed)
Patient ID: Susan Carr, female   DOB: [birth year], 64 y.o.   MRN: [id number] [name]

## 2014-10-24 ENCOUNTER — Encounter: Payer: Self-pay | Admitting: Emergency Medicine

## 2015-01-17 ENCOUNTER — Other Ambulatory Visit: Payer: Self-pay | Admitting: Obstetrics and Gynecology

## 2015-01-17 ENCOUNTER — Other Ambulatory Visit (HOSPITAL_COMMUNITY)
Admission: RE | Admit: 2015-01-17 | Discharge: 2015-01-17 | Disposition: A | Discharge status: Home / Self Care | Payer: BLUE CROSS/BLUE SHIELD | Source: Ambulatory Visit | Attending: Obstetrics and Gynecology | Admitting: Obstetrics and Gynecology

## 2015-01-17 DIAGNOSIS — Z01419 Encounter for gynecological examination (general) (routine) without abnormal findings: Secondary | ICD-10-CM | POA: Diagnosis present

## 2015-01-19 ENCOUNTER — Encounter: Payer: Self-pay | Admitting: Internal Medicine

## 2015-01-19 ENCOUNTER — Ambulatory Visit (INDEPENDENT_AMBULATORY_CARE_PROVIDER_SITE_OTHER): Payer: BLUE CROSS/BLUE SHIELD | Admitting: Internal Medicine

## 2015-01-19 VITALS — BP 122/80 | HR 72 | Temp 98.2°F | Resp 16 | Ht 64.0 in | Wt 161.0 lb

## 2015-01-19 DIAGNOSIS — E7800 Pure hypercholesterolemia, unspecified: Secondary | ICD-10-CM

## 2015-01-19 DIAGNOSIS — M719 Bursopathy, unspecified: Secondary | ICD-10-CM

## 2015-01-19 DIAGNOSIS — J324 Chronic pansinusitis: Secondary | ICD-10-CM

## 2015-01-19 LAB — CYTOLOGY - PAP

## 2015-01-19 MED ORDER — CELECOXIB 100 MG PO CAPS: 100.0000 mg | ORAL_CAPSULE | Freq: Every day | ORAL | Status: DC | PRN

## 2015-01-19 MED ORDER — AZITHROMYCIN 250 MG PO TABS: ORAL_TABLET | ORAL | Status: DC

## 2015-01-19 MED ORDER — CYCLOBENZAPRINE HCL 10 MG PO TABS: 10.0000 mg | ORAL_TABLET | Freq: Every day | ORAL | Status: DC

## 2015-01-19 MED ORDER — EZETIMIBE 10 MG PO TABS: 10.0000 mg | ORAL_TABLET | Freq: Every day | ORAL | Status: DC

## 2015-01-19 MED ORDER — PREDNISONE 20 MG PO TABS: ORAL_TABLET | ORAL | Status: DC

## 2015-01-19 NOTE — Progress Notes (Signed)
Subjective:    Patient ID: Susan Carr, female    DOB: [birth year], 64 y.o.   MRN: [id number]  Hip Pain  Pertinent negatives include no numbness.  Patient presents to the office for evaluation of right knee pain and bilateral hip pains.  She reports that she has no injury that she can think of.  She reports that over the past couple months she has been having right knee pain.  She reports that it has been very achey and sore.  She reports some swelling late in the evening, but mornings it is okay.  She reports that her left hip has been bothering her late in the evening.  She reports the pain in her left hip and low back is more like a sharp pain especially when she tries to stand on it.  She reports that she has tried taking motrin 400 mg twice daily.  She reports that she doesn't like taking it because it hurts her stomach.  She has tried some heat on it as well which she feels like helps a lot.    She has also been having sinus pressure, yellow nasal sputum, fever, and chills for the past week.  She sometimes has a foul smell in her nose and bad taste in her mouth.  She does have history of chronic sinus infections.      Review of Systems  Constitutional: Negative for fever and chills.  HENT: Positive for congestion, postnasal drip, sinus pressure and sore throat. Negative for facial swelling, rhinorrhea, trouble swallowing and voice change.   Respiratory: Negative for cough, chest tightness and shortness of breath.   Cardiovascular: Negative for chest pain and palpitations.  Musculoskeletal: Positive for myalgias, back pain, joint swelling, arthralgias and gait problem.  Neurological: Negative for numbness.       Objective:   Physical Exam  Constitutional: She is oriented to person, place, and time. She appears well-developed and well-nourished. No distress.  HENT:  Head: Normocephalic and atraumatic.  Nose: Mucosal edema present. Right sinus exhibits maxillary sinus tenderness  and frontal sinus tenderness. Left sinus exhibits maxillary sinus tenderness and frontal sinus tenderness.  Mouth/Throat: Uvula is midline, oropharynx is clear and moist and mucous membranes are normal. No trismus in the jaw.  Eyes: Conjunctivae are normal. No scleral icterus.  Neck: Normal range of motion. Neck supple. No JVD present. No thyromegaly present.  Cardiovascular: Normal rate, regular rhythm, normal heart sounds and intact distal pulses.   Pulmonary/Chest: Effort normal and breath sounds normal. No respiratory distress. She has no wheezes. She has no rales. She exhibits no tenderness.  Abdominal: Soft. Bowel sounds are normal.  Musculoskeletal:       Right hip: She exhibits tenderness. She exhibits normal range of motion, normal strength, no bony tenderness, no swelling, no crepitus, no deformity and no laceration.       Left hip: She exhibits tenderness. She exhibits normal range of motion, normal strength, no bony tenderness, no swelling, no crepitus, no deformity and no laceration.       Right knee: She exhibits swelling and bony tenderness. She exhibits normal range of motion, no effusion, no ecchymosis, no deformity, no laceration, no erythema, normal alignment, no LCL laxity, normal patellar mobility, normal meniscus and no MCL laxity. Tenderness found. Medial joint line and lateral joint line tenderness noted. No MCL, no LCL and no patellar tendon tenderness noted.       Legs: Lymphadenopathy:    She has no cervical adenopathy.  Neurological:  She is alert and oriented to person, place, and time.  Skin: Skin is warm and dry. She is not diaphoretic.  Psychiatric: She has a normal mood and affect. Her behavior is normal. Judgment and thought content normal.  Nursing note and vitals reviewed.         Assessment & Plan:    1. Pansinusitis, unspecified chronicity -nasal saline -mucinex prn -zyrtec - predniSONE (DELTASONE) 20 MG tablet; 1 tab 3 x day for 3 days, then 1 tab  2 x day for 3 days, then 1 tab 1 x day for 5 days  Dispense: 20 tablet; Refill: 0 - azithromycin (ZITHROMAX) 250 MG tablet; Take 2 tablets (500 mg) on  Day 1,  followed by 1 tablet (250 mg) once daily on Days 2 through 5.  Dispense: 6 each; Refill: 1  2. Pure hypercholesterolemia  - ezetimibe (ZETIA) 10 MG tablet; Take 1 tablet (10 mg total) by mouth daily.  Dispense: 90 tablet; Refill: 3  3. Bursitis -subtrochanteric bursitis consistent with exam -try prednisone with celebrex course after if no improvement send to ortho for injections - celecoxib (CELEBREX) 100 MG capsule; Take 1 capsule (100 mg total) by mouth daily as needed.  Dispense: 30 capsule; Refill: 1  4.  Right knee pain -likely arthritis -prednisone -then celebrex

## 2015-01-19 NOTE — Patient Instructions (Signed)
Please start taking the prednisone right away.  This will help with your sinuses and also your joint pains.  Once you finish the prednisone please take the celebrex daily for 2 weeks and then you can take it as you need it.  You may also take 1 zpak for your sinus infection.  If you have no improvement let us know and we will send you back to the orthopedist Dr. Rhona Raider.     Hip Bursitis Bursitis is a swelling and soreness (inflammation) of a fluid-filled sac (bursa). This sac overlies and protects the joints.  CAUSES   Injury.  Overuse of the muscles surrounding the joint.  Arthritis.  Gout.  Infection.  Cold weather.  Inadequate warm-up and conditioning prior to activities. The cause may not be known.  SYMPTOMS   Mild to severe irritation.  Tenderness and swelling over the outside of the hip.  Pain with motion of the hip.  If the bursa becomes infected, a fever may be present. Redness, tenderness, and warmth will develop over the hip. Symptoms usually lessen in 3 to 4 weeks with treatment, but can come back. TREATMENT If conservative treatment does not work, your caregiver may advise draining the bursa and injecting cortisone into the area. This may speed up the healing process. This may also be used as an initial treatment of choice. HOME CARE INSTRUCTIONS   Apply ice to the affected area for 15-20 minutes every 3 to 4 hours while awake for the first 2 days. Put the ice in a plastic bag and place a towel between the bag of ice and your skin.  Rest the painful joint as much as possible, but continue to put the joint through a normal range of motion at least 4 times per day. When the pain lessens, begin normal, slow movements and usual activities to help prevent stiffness of the hip.  Only take over-the-counter or prescription medicines for pain, discomfort, or fever as directed by your caregiver.  Use crutches to limit weight bearing on the hip joint, if  advised.  Elevate your painful hip to reduce swelling. Use pillows for propping and cushioning your legs and hips.  Gentle massage may provide comfort and decrease swelling. SEEK IMMEDIATE MEDICAL CARE IF:   Your pain increases even during treatment, or you are not improving.  You have a fever.  You have heat and inflammation over the involved bursa.  You have any other questions or concerns. MAKE SURE YOU:   Understand these instructions.  Will watch your condition.  Will get help right away if you are not doing well or get worse. Document Released: 12/20/2001 Document Revised: 09/22/2011 Document Reviewed: 07/19/2008 [facility] Patient Information 2015 [place], [place]. This information is not intended to replace advice given to you by your health care provider. Make sure you discuss any questions you have with your health care provider.  Arthritis, Nonspecific Arthritis is inflammation of a joint. This usually means pain, redness, warmth or swelling are present. One or more joints may be involved. There are a number of types of arthritis. Your caregiver may not be able to tell what type of arthritis you have right away. CAUSES  The most common cause of arthritis is the wear and tear on the joint (osteoarthritis). This causes damage to the cartilage, which can break down over time. The knees, hips, back and neck are most often affected by this type of arthritis. Other types of arthritis and common causes of joint pain include:  Sprains  and other injuries near the joint. Sometimes minor sprains and injuries cause pain and swelling that develop hours later.  Rheumatoid arthritis. This affects hands, feet and knees. It usually affects both sides of your body at the same time. It is often associated with chronic ailments, fever, weight loss and general weakness.  Crystal arthritis. Gout and pseudo gout can cause occasional acute severe pain, redness and swelling in the foot, ankle, or  knee.  Infectious arthritis. Bacteria can get into a joint through a break in overlying skin. This can cause infection of the joint. Bacteria and viruses can also spread through the blood and affect your joints.  Drug, infectious and allergy reactions. Sometimes joints can become mildly painful and slightly swollen with these types of illnesses. SYMPTOMS   Pain is the main symptom.  Your joint or joints can also be red, swollen and warm or hot to the touch.  You may have a fever with certain types of arthritis, or even feel overall ill.  The joint with arthritis will hurt with movement. Stiffness is present with some types of arthritis. DIAGNOSIS  Your caregiver will suspect arthritis based on your description of your symptoms and on your exam. Testing may be needed to find the type of arthritis:  Blood and sometimes urine tests.  X-ray tests and sometimes CT or MRI scans.  Removal of fluid from the joint (arthrocentesis) is done to check for bacteria, crystals or other causes. Your caregiver (or a specialist) will numb the area over the joint with a local anesthetic, and use a needle to remove joint fluid for examination. This procedure is only minimally uncomfortable.  Even with these tests, your caregiver may not be able to tell what kind of arthritis you have. Consultation with a specialist (rheumatologist) may be helpful. TREATMENT  Your caregiver will discuss with you treatment specific to your type of arthritis. If the specific type cannot be determined, then the following general recommendations may apply. Treatment of severe joint pain includes:  Rest.  Elevation.  Anti-inflammatory medication (for example, ibuprofen) may be prescribed. Avoiding activities that cause increased pain.  Only take over-the-counter or prescription medicines for pain and discomfort as recommended by your caregiver.  Cold packs over an inflamed joint may be used for 10 to 15 minutes every hour.  Hot packs sometimes feel better, but do not use overnight. Do not use hot packs if you are diabetic without your caregiver's permission.  A cortisone shot into arthritic joints may help reduce pain and swelling.  Any acute arthritis that gets worse over the next 1 to 2 days needs to be looked at to be sure there is no joint infection. Long-term arthritis treatment involves modifying activities and lifestyle to reduce joint stress jarring. This can include weight loss. Also, exercise is needed to nourish the joint cartilage and remove waste. This helps keep the muscles around the joint strong. HOME CARE INSTRUCTIONS   Do not take aspirin to relieve pain if gout is suspected. This elevates uric acid levels.  Only take over-the-counter or prescription medicines for pain, discomfort or fever as directed by your caregiver.  Rest the joint as much as possible.  If your joint is swollen, keep it elevated.  Use crutches if the painful joint is in your leg.  Drinking plenty of fluids may help for certain types of arthritis.  Follow your caregiver's dietary instructions.  Try low-impact exercise such as:  Swimming.  Water aerobics.  Biking.  Walking.  Morning  stiffness is often relieved by a warm shower.  Put your joints through regular range-of-motion. SEEK MEDICAL CARE IF:   You do not feel better in 24 hours or are getting worse.  You have side effects to medications, or are not getting better with treatment. SEEK IMMEDIATE MEDICAL CARE IF:   You have a fever.  You develop severe joint pain, swelling or redness.  Many joints are involved and become painful and swollen.  There is severe back pain and/or leg weakness.  You have loss of bowel or bladder control. Document Released: 08/07/2004 Document Revised: 09/22/2011 Document Reviewed: 08/23/2008 [facility] Patient Information 2015 [place], [place]. This information is not intended to replace advice given to you by your  health care provider. Make sure you discuss any questions you have with your health care provider.

## 2015-01-24 ENCOUNTER — Encounter: Payer: Self-pay | Admitting: Internal Medicine

## 2015-01-24 ENCOUNTER — Ambulatory Visit (INDEPENDENT_AMBULATORY_CARE_PROVIDER_SITE_OTHER): Payer: BLUE CROSS/BLUE SHIELD | Admitting: Internal Medicine

## 2015-01-24 VITALS — BP 144/66 | HR 78 | Temp 98.2°F | Resp 16 | Ht 63.0 in | Wt 160.0 lb

## 2015-01-24 DIAGNOSIS — F5104 Psychophysiologic insomnia: Secondary | ICD-10-CM

## 2015-01-24 DIAGNOSIS — I10 Essential (primary) hypertension: Secondary | ICD-10-CM

## 2015-01-24 DIAGNOSIS — Z0000 Encounter for general adult medical examination without abnormal findings: Secondary | ICD-10-CM

## 2015-01-24 DIAGNOSIS — Z79899 Other long term (current) drug therapy: Secondary | ICD-10-CM

## 2015-01-24 DIAGNOSIS — R5383 Other fatigue: Secondary | ICD-10-CM

## 2015-01-24 DIAGNOSIS — R7303 Prediabetes: Secondary | ICD-10-CM

## 2015-01-24 DIAGNOSIS — E663 Overweight: Secondary | ICD-10-CM

## 2015-01-24 DIAGNOSIS — E785 Hyperlipidemia, unspecified: Secondary | ICD-10-CM

## 2015-01-24 DIAGNOSIS — R0989 Other specified symptoms and signs involving the circulatory and respiratory systems: Secondary | ICD-10-CM

## 2015-01-24 DIAGNOSIS — Z1212 Encounter for screening for malignant neoplasm of rectum: Secondary | ICD-10-CM

## 2015-01-24 DIAGNOSIS — E559 Vitamin D deficiency, unspecified: Secondary | ICD-10-CM

## 2015-01-24 DIAGNOSIS — F411 Generalized anxiety disorder: Secondary | ICD-10-CM

## 2015-01-24 DIAGNOSIS — R7309 Other abnormal glucose: Secondary | ICD-10-CM

## 2015-01-24 NOTE — Addendum Note (Signed)
Addended by: Starlyn Skeans A on: 01/24/2015 05:07 PM   Modules accepted: Miquel Dunn

## 2015-01-24 NOTE — Patient Instructions (Signed)

## 2015-01-24 NOTE — Progress Notes (Addendum)
Patient ID: Susan Carr, female   DOB: 07-18-1950, 64 y.o.   MRN: [id number]  Complete Physical  Assessment and Plan:  1. Labile hypertension  - Urinalysis, Routine w reflex microscopic (not at [facility]) - Microalbumin / creatinine urine ratio - EKG 12-Lead - [place], RETROPERITNL ABD,  LTD - TSH  2. Prediabetes  - Hemoglobin A1c - Insulin, random  3. Hyperlipidemia -cont diet and exercise - Lipid panel  4. Vitamin D deficiency -cont supplement - Vit D  25 hydroxy (rtn osteoporosis monitoring)  5. Insomnia, psychophysiological -sleep hygeine discussed -recommended benaryl with lunesta -no electronics before bed  6. Anxiety state -recommended better sleep hygene -may be source of insomnia  7. Encounter for long-term (current) use of medications  - CBC with Differential/Platelet - BASIC METABOLIC PANEL WITH GFR - Hepatic function panel - Magnesium  8. Other fatigue  - Iron and TIBC - Vitamin B12  9. Screening for rectal cancer -POC hemoccult  10. Overweight -diet and exercise    Discussed med's effects and SE's. Screening labs and tests as requested with regular follow-up as recommended.  HPI  64 y.o. female  presents for a complete physical.  Her blood pressure has been controlled at home, today their BP is BP: (!) 144/66 mmHg.  She does not workout. She denies chest pain, shortness of breath, dizziness. She does report that she works very hard.    She is on cholesterol medication and denies myalgias. Her cholesterol is not at goal. The cholesterol last visit was:  Lab Results  Component Value Date   CHOL 216* 06/30/2014   HDL 53 06/30/2014   LDLCALC 148* 06/30/2014   TRIG 76 06/30/2014   CHOLHDL 4.1 06/30/2014  .  She reports that she is not taking it consistently.    She has been working on diet and exercise for prediabetes, she is not on bASA, she is not on ACE/ARB and denies foot ulcerations, hyperglycemia, hypoglycemia , increased appetite,  nausea, paresthesia of the feet, polydipsia, polyuria, visual disturbances, vomiting and weight loss. Last A1C in the office was:  Lab Results  Component Value Date   HGBA1C 6.0* 06/30/2014    Patient is on Vitamin D supplement.   Lab Results  Component Value Date   VD25OH 48 06/30/2014     Patient reports that her hips and knees are feeling a lot better.  She does report that she finished the prednisone and is back to taking the celebrex.    She reports that she has been having some abdominal spasms which have been happening intermittently.  She reports that it can sometimes bring her to her knees.  She does not know if anything brings it on.    She feels like she is having a really hard time sleeping.  She still takes her lunesta and she reports that it does not work as well as it used to.  She reports that she has tried melatonin and she finds that it does not help.     Current Medications:  Current Outpatient Prescriptions on File Prior to Visit  Medication Sig Dispense Refill  . acyclovir (ZOVIRAX) 800 MG tablet Take 1 tablet 3 x d with meals and 2 tablets at bedtime  (Toal of 5 tablets daily) 50 tablet 0  . alendronate (FOSAMAX) 70 MG tablet Take 1 tablet by mouth  every week 12 tablet 99  . azithromycin (ZITHROMAX) 250 MG tablet Take 2 tablets (500 mg) on  Day 1,  followed by 1 tablet (  250 mg) once daily on Days 2 through 5. 6 each 1  . butalbital-acetaminophen-caffeine (FIORICET) 50-325-40 MG per tablet Take 1 or 2 tablets 4 x day if needed for headache 60 tablet 1  . celecoxib (CELEBREX) 100 MG capsule Take 1 capsule (100 mg total) by mouth daily as needed. 30 capsule 1  . Cholecalciferol (VITAMIN D PO) Take 5,000 Units by mouth daily.    . cyclobenzaprine (FLEXERIL) 10 MG tablet Take 1 tablet (10 mg total) by mouth at bedtime. 30 tablet 1  . eszopiclone (LUNESTA) 2 MG TABS tablet (8/8) TAKE 1 TABLET AT BEDTIME AS NEEDED FOR SLEEP 90 tablet 3  . ezetimibe (ZETIA) 10 MG tablet  Take 1 tablet (10 mg total) by mouth daily. 90 tablet 3  . fish oil-omega-3 fatty acids 1000 MG capsule Take 1 g by mouth every morning.    . SUMAtriptan (IMITREX) 50 MG tablet Take 1 tablet (50 mg total) by mouth every 2 (two) hours as needed for migraine or headache. May repeat in 2 hours if headache persists or recurs. 10 tablet 0  . vitamin E 100 UNIT capsule Take 100 Units by mouth every morning.     No current facility-administered medications on file prior to visit.    Health Maintenance:   Immunization History  Administered Date(s) Administered  . Tdap 08/16/2012    Tetanus: 2014 MGM: 05/06/14 DEXA: 10/22/11 Colonoscopy: 2002 Last Dental Exam: Dr. Steva Ready Last Eye Exam: [facility], [facility] Majestic two weeks ago  Patient Care Team: Unk Pinto, MD as PCP - General (Internal Medicine) Barbaraann Cao, OD as Referring Physician (Optometry) Clarene Essex, MD as Consulting Physician (Gastroenterology) Christophe Louis, MD as Consulting Physician (Obstetrics and Gynecology) Melrose Nakayama, MD as Consulting Physician (Orthopedic Surgery) Dorna Leitz, MD as Consulting Physician (Orthopedic Surgery)  Allergies:  Allergies  Allergen Reactions  . Cymbalta [Duloxetine Hcl]     Medical History:  Past Medical History  Diagnosis Date  . Arthritis   . Hyperlipidemia   . Labile hypertension     Surgical History:  Past Surgical History  Procedure Laterality Date  . Neck surgery      Family History:  Family History  Problem Relation Age of Onset  . Heart attack Mother   . Diabetes Mother   . Heart attack Father   . Diabetes Other   . Multiple sclerosis Other     Social History:  History  Substance Use Topics  . Smoking status: Never Smoker   . Smokeless tobacco: Not on file  . Alcohol Use: No    Review of Systems: Review of Systems  Constitutional: Positive for malaise/fatigue. Negative for fever and chills.  HENT: Negative for congestion, ear pain and sore throat.    Eyes: Negative.  Negative for blurred vision and double vision.  Respiratory: Negative for cough, shortness of breath and wheezing.   Cardiovascular: Negative for chest pain, palpitations and leg swelling.  Gastrointestinal: Positive for heartburn. Negative for abdominal pain, diarrhea, constipation, blood in stool and melena.  Genitourinary: Negative.   Skin: Negative.   Neurological: Negative for dizziness, sensory change and headaches.  Psychiatric/Behavioral: Negative for depression. The patient is nervous/anxious and has insomnia.     Physical Exam: Estimated body mass index is 28.35 kg/(m^2) as calculated from the following:   Height as of this encounter: 5\' 3"  (1.6 m).   Weight as of this encounter: 160 lb (72.576 kg). BP 144/66 mmHg  Pulse 78  Temp(Src) 98.2 F (36.8 C) (Temporal)  Resp  16  Ht 5\' 3"  (1.6 m)  Wt 160 lb (72.576 kg)  BMI 28.35 kg/m2  General Appearance: Well nourished well developed, in no apparent distress.  Eyes: PERRLA, EOMs, conjunctiva no swelling or erythema ENT/Mouth: Ear canals normal without obstruction, swelling, erythema, or discharge.  TMs normal bilaterally with no erythema, bulging, retraction, or loss of landmark.  Oropharynx moist and clear with no exudate, erythema, or swelling.   Neck: Supple, thyroid normal. No bruits.  No cervical adenopathy Respiratory: Respiratory effort normal, Breath sounds clear A&P without wheeze, rhonchi, rales.   Cardio: RRR without murmurs, rubs or gallops. Brisk peripheral pulses without edema.  Chest: symmetric, with normal excursions Breasts: Symmetric, without lumps, nipple discharge, retractions.  Abdomen: Soft, nontender, no guarding, rebound, hernias, masses, or organomegaly.  Lymphatics: Non tender without lymphadenopathy.  Genitourinary:  Musculoskeletal: Full ROM all peripheral extremities,5/5 strength, and normal gait.  Skin: Warm, dry without rashes, lesions, ecchymosis. Neuro: Awake and oriented X  3, Cranial nerves intact, reflexes equal bilaterally. Normal muscle tone, no cerebellar symptoms. Sensation intact.  Psych:  normal affect, Insight and Judgment appropriate.   EKG: WNL no changes.  AORTA SCAN: WNL   Over 40 minutes of exam, counseling, chart review and critical decision making was performed  Starlyn Skeans 3:19 PM [facility]

## 2015-01-25 LAB — BASIC METABOLIC PANEL WITH GFR
BUN: 16 mg/dL (ref 6–23)
CO2: 28 mEq/L (ref 19–32)
Calcium: 9 mg/dL (ref 8.4–10.5)
Chloride: 103 mEq/L (ref 96–112)
Creat: 0.79 mg/dL (ref 0.50–1.10)
GFR, Est African American: >89 mL/min
GFR, Est Non African American: 79 mL/min
Glucose, Bld: 182 mg/dL — ABNORMAL HIGH (ref 70–99)
Potassium: 3.9 mEq/L (ref 3.5–5.3)
Sodium: 140 mEq/L (ref 135–145)

## 2015-01-25 LAB — URINALYSIS, ROUTINE W REFLEX MICROSCOPIC
Bilirubin Urine: NEGATIVE
Glucose, UA: NEGATIVE mg/dL
HGB URINE DIPSTICK: NEGATIVE
KETONES UR: NEGATIVE mg/dL
Leukocytes, UA: NEGATIVE
Nitrite: NEGATIVE
PROTEIN: NEGATIVE mg/dL
Specific Gravity, Urine: 1.025 (ref 1.005–1.030)
Urobilinogen, UA: 0.2 mg/dL (ref 0.0–1.0)
pH: 6 (ref 5.0–8.0)

## 2015-01-25 LAB — IRON AND TIBC
%SAT: 24 % (ref 20–55)
Iron: 62 ug/dL (ref 42–145)
TIBC: 257 ug/dL (ref 250–470)
UIBC: 195 ug/dL (ref 125–400)

## 2015-01-25 LAB — LIPID PANEL
CHOLESTEROL: 205 mg/dL — AB (ref 0–200)
HDL: 72 mg/dL (ref >=46)
LDL Cholesterol: 118 mg/dL — ABNORMAL HIGH (ref 0–99)
Total CHOL/HDL Ratio: 2.8 Ratio
Triglycerides: 74 mg/dL (ref <150)
VLDL: 15 mg/dL (ref 0–40)

## 2015-01-25 LAB — HEPATIC FUNCTION PANEL
ALT: 14 U/L (ref 0–35)
AST: 16 U/L (ref 0–37)
Albumin: 4.1 g/dL (ref 3.5–5.2)
Alkaline Phosphatase: 49 U/L (ref 39–117)
Bilirubin, Direct: 0.1 mg/dL (ref 0.0–0.3)
Indirect Bilirubin: 0.4 mg/dL (ref 0.2–1.2)
Total Bilirubin: 0.5 mg/dL (ref 0.2–1.2)
Total Protein: 6.7 g/dL (ref 6.0–8.3)

## 2015-01-25 LAB — CBC WITH DIFFERENTIAL/PLATELET
Basophils Absolute: 0 10*3/uL (ref 0.0–0.1)
Basophils Relative: 0 % (ref 0–1)
Eosinophils Absolute: 0 10*3/uL (ref 0.0–0.7)
Eosinophils Relative: 0 % (ref 0–5)
HCT: 42 % (ref 36.0–46.0)
Hemoglobin: 13.9 g/dL (ref 12.0–15.0)
Lymphocytes Relative: 14 % (ref 12–46)
Lymphs Abs: 1.7 10*3/uL (ref 0.7–4.0)
MCH: 31.7 pg (ref 26.0–34.0)
MCHC: 33.1 g/dL (ref 30.0–36.0)
MCV: 95.9 fL (ref 78.0–100.0)
MONOS PCT: 4 % (ref 3–12)
MPV: 9.4 fL (ref 8.6–12.4)
Monocytes Absolute: 0.5 10*3/uL (ref 0.1–1.0)
Neutro Abs: 10.2 10*3/uL — ABNORMAL HIGH (ref 1.7–7.7)
Neutrophils Relative %: 82 % — ABNORMAL HIGH (ref 43–77)
Platelets: 296 10*3/uL (ref 150–400)
RBC: 4.38 MIL/uL (ref 3.87–5.11)
RDW: 13.2 % (ref 11.5–15.5)
WBC: 12.4 10*3/uL — ABNORMAL HIGH (ref 4.0–10.5)

## 2015-01-25 LAB — MAGNESIUM: MAGNESIUM: 2.2 mg/dL (ref 1.5–2.5)

## 2015-01-25 LAB — TSH: TSH: 0.058 u[IU]/mL — ABNORMAL LOW (ref 0.350–4.500)

## 2015-01-25 LAB — HEMOGLOBIN A1C
Hgb A1c MFr Bld: 5.9 % — ABNORMAL HIGH (ref <5.7)
Mean Plasma Glucose: 123 mg/dL — ABNORMAL HIGH (ref <117)

## 2015-01-25 LAB — VITAMIN D 25 HYDROXY (VIT D DEFICIENCY, FRACTURES): Vit D, 25-Hydroxy: 51 ng/mL (ref 30–100)

## 2015-01-25 LAB — MICROALBUMIN / CREATININE URINE RATIO
Creatinine, Urine: 171.7 mg/dL
MICROALB UR: 0.8 mg/dL (ref <2.0)
Microalb Creat Ratio: 4.7 mg/g (ref 0.0–30.0)

## 2015-01-25 LAB — VITAMIN B12: Vitamin B-12: 878 pg/mL (ref 211–911)

## 2015-01-25 LAB — INSULIN, RANDOM: Insulin: 74 u[IU]/mL — ABNORMAL HIGH (ref 2.0–19.6)

## 2015-03-01 ENCOUNTER — Other Ambulatory Visit: Payer: Self-pay | Admitting: Internal Medicine

## 2015-03-01 ENCOUNTER — Encounter: Payer: Self-pay | Admitting: Internal Medicine

## 2015-03-01 ENCOUNTER — Ambulatory Visit (INDEPENDENT_AMBULATORY_CARE_PROVIDER_SITE_OTHER): Payer: BLUE CROSS/BLUE SHIELD | Admitting: Internal Medicine

## 2015-03-01 VITALS — BP 118/64 | HR 76 | Temp 98.2°F | Resp 16 | Ht 63.0 in | Wt 164.0 lb

## 2015-03-01 DIAGNOSIS — M542 Cervicalgia: Secondary | ICD-10-CM

## 2015-03-01 DIAGNOSIS — E0590 Thyrotoxicosis, unspecified without thyrotoxic crisis or storm: Secondary | ICD-10-CM

## 2015-03-01 LAB — TSH: TSH: 0.337 u[IU]/mL — ABNORMAL LOW (ref 0.350–4.500)

## 2015-03-01 NOTE — Progress Notes (Signed)
Assessment and Plan:   1. Hyperthyroidism  - TSH -may need RAIU  2. Neck pain  - Ambulatory referral to Neurosurgery  HPI 64 y.o.female presents for 1 month follow up of elevated TSH and some anxiety.  Patient reports that she has a family history of a goiter.  She does feel like she isn't sleeping well either.   She also reports that her neck has been bothering her and she has not been able to get ahold of Dr. Vertell Limber who originally did her neck surgery.    Past Medical History  Diagnosis Date  . Arthritis   . Hyperlipidemia   . Labile hypertension      Allergies  Allergen Reactions  . Cymbalta [Duloxetine Hcl]       Current Outpatient Prescriptions on File Prior to Visit  Medication Sig Dispense Refill  . acyclovir (ZOVIRAX) 800 MG tablet Take 1 tablet 3 x d with meals and 2 tablets at bedtime  (Toal of 5 tablets daily) 50 tablet 0  . alendronate (FOSAMAX) 70 MG tablet Take 1 tablet by mouth  every week 12 tablet 99  . butalbital-acetaminophen-caffeine (FIORICET) 50-325-40 MG per tablet Take 1 or 2 tablets 4 x day if needed for headache 60 tablet 1  . celecoxib (CELEBREX) 100 MG capsule Take 1 capsule (100 mg total) by mouth daily as needed. 30 capsule 1  . Cholecalciferol (VITAMIN D PO) Take 5,000 Units by mouth daily.    . eszopiclone (LUNESTA) 2 MG TABS tablet (8/8) TAKE 1 TABLET AT BEDTIME AS NEEDED FOR SLEEP 90 tablet 3  . ezetimibe (ZETIA) 10 MG tablet Take 1 tablet (10 mg total) by mouth daily. 90 tablet 3  . fish oil-omega-3 fatty acids 1000 MG capsule Take 1 g by mouth every morning.    . SUMAtriptan (IMITREX) 50 MG tablet Take 1 tablet (50 mg total) by mouth every 2 (two) hours as needed for migraine or headache. May repeat in 2 hours if headache persists or recurs. 10 tablet 0  . vitamin E 100 UNIT capsule Take 100 Units by mouth every morning.     No current facility-administered medications on file prior to visit.    ROS: all negative except above.    Physical Exam: Filed Weights   03/01/15 1622  Weight: 164 lb (74.39 kg)   BP 118/64 mmHg  Pulse 76  Temp(Src) 98.2 F (36.8 C) (Temporal)  Resp 16  Ht 5\' 3"  (1.6 m)  Wt 164 lb (74.39 kg)  BMI 29.06 kg/m2 General Appearance: Well developed well nourished, non-toxic appearing in no apparent distress. Eyes: PERRLA, EOMs, conjunctiva w/ no swelling or erythema or discharge Sinuses: No Frontal/maxillary tenderness ENT/Mouth: Ear canals clear without swelling or erythema.  TM's normal bilaterally with no retractions, bulging, or loss of landmarks.   Neck: Supple, thyroid normal, no notable JVD, possible small pea sized palpable nodule on left lobe of the thyroid.   Respiratory: Respiratory effort normal, Clear breath sounds anteriorly and posteriorly bilaterally without rales, rhonchi, wheezing or stridor. No retractions or accessory muscle usage. Cardio: RRR with no MRGs.   Abdomen: Soft, + BS.  Non tender, no guarding, rebound, hernias, masses.  Musculoskeletal: Full ROM, 5/5 strength, normal gait.  Skin: Warm, dry without rashes  Neuro: Awake and oriented X 3, Cranial nerves intact. Normal muscle tone, no cerebellar symptoms. Sensation intact.  Psych: normal affect, Insight and Judgment appropriate.     Susan Skeans, PA-C 5:18 PM [facility]

## 2015-03-02 ENCOUNTER — Other Ambulatory Visit: Payer: Self-pay | Admitting: Internal Medicine

## 2015-03-02 NOTE — Telephone Encounter (Signed)
Called Rx into pharm

## 2015-03-04 ENCOUNTER — Other Ambulatory Visit: Payer: Self-pay | Admitting: Internal Medicine

## 2015-03-04 DIAGNOSIS — E0590 Thyrotoxicosis, unspecified without thyrotoxic crisis or storm: Secondary | ICD-10-CM

## 2015-03-05 ENCOUNTER — Other Ambulatory Visit: Payer: Self-pay | Admitting: Internal Medicine

## 2015-03-05 DIAGNOSIS — E0590 Thyrotoxicosis, unspecified without thyrotoxic crisis or storm: Secondary | ICD-10-CM

## 2015-03-06 ENCOUNTER — Encounter: Payer: Self-pay | Admitting: Internal Medicine

## 2015-03-13 ENCOUNTER — Ambulatory Visit (HOSPITAL_COMMUNITY): Payer: BLUE CROSS/BLUE SHIELD

## 2015-03-14 ENCOUNTER — Other Ambulatory Visit (HOSPITAL_COMMUNITY): Payer: BLUE CROSS/BLUE SHIELD

## 2015-03-21 ENCOUNTER — Encounter (HOSPITAL_COMMUNITY): Payer: BLUE CROSS/BLUE SHIELD

## 2015-03-22 ENCOUNTER — Encounter (HOSPITAL_COMMUNITY): Payer: BLUE CROSS/BLUE SHIELD

## 2015-04-04 ENCOUNTER — Encounter (HOSPITAL_COMMUNITY)
Admission: RE | Admit: 2015-04-04 | Discharge: 2015-04-04 | Disposition: A | Discharge status: Home / Self Care | Payer: BLUE CROSS/BLUE SHIELD | Source: Ambulatory Visit | Attending: Internal Medicine | Admitting: Internal Medicine

## 2015-04-04 DIAGNOSIS — R1310 Dysphagia, unspecified: Secondary | ICD-10-CM | POA: Insufficient documentation

## 2015-04-04 DIAGNOSIS — E0590 Thyrotoxicosis, unspecified without thyrotoxic crisis or storm: Secondary | ICD-10-CM

## 2015-04-04 DIAGNOSIS — E042 Nontoxic multinodular goiter: Secondary | ICD-10-CM | POA: Insufficient documentation

## 2015-04-04 MED ORDER — SODIUM IODIDE I 131 CAPSULE
10.2000 | Freq: Once | INTRAVENOUS | Status: AC | PRN
  Administered 2015-04-04: 10.2 via ORAL

## 2015-04-05 ENCOUNTER — Encounter (HOSPITAL_COMMUNITY)
Admission: RE | Admit: 2015-04-05 | Discharge: 2015-04-05 | Disposition: A | Discharge status: Home / Self Care | Payer: BLUE CROSS/BLUE SHIELD | Source: Ambulatory Visit | Attending: Internal Medicine | Admitting: Internal Medicine

## 2015-04-05 DIAGNOSIS — E042 Nontoxic multinodular goiter: Secondary | ICD-10-CM | POA: Diagnosis not present

## 2015-04-05 DIAGNOSIS — R1310 Dysphagia, unspecified: Secondary | ICD-10-CM | POA: Diagnosis not present

## 2015-04-05 DIAGNOSIS — E0590 Thyrotoxicosis, unspecified without thyrotoxic crisis or storm: Secondary | ICD-10-CM | POA: Diagnosis present

## 2015-04-05 MED ORDER — SODIUM PERTECHNETATE TC 99M INJECTION
10.0000 | Freq: Once | INTRAVENOUS | Status: AC | PRN
  Administered 2015-04-05: 10 via INTRAVENOUS

## 2015-04-11 ENCOUNTER — Other Ambulatory Visit: Payer: Self-pay | Admitting: Internal Medicine

## 2015-05-01 ENCOUNTER — Ambulatory Visit: Payer: Self-pay | Admitting: Internal Medicine

## 2015-06-17 ENCOUNTER — Other Ambulatory Visit: Payer: Self-pay | Admitting: Internal Medicine

## 2015-06-17 ENCOUNTER — Other Ambulatory Visit: Payer: Self-pay | Admitting: Physician Assistant

## 2015-06-18 ENCOUNTER — Other Ambulatory Visit: Payer: Self-pay | Admitting: *Deleted

## 2015-06-18 ENCOUNTER — Other Ambulatory Visit: Payer: Self-pay | Admitting: Physician Assistant

## 2015-06-18 MED ORDER — ESZOPICLONE 2 MG PO TABS: 2.0000 mg | ORAL_TABLET | Freq: Every evening | ORAL | Status: DC | PRN

## 2015-06-25 ENCOUNTER — Other Ambulatory Visit: Payer: Self-pay | Admitting: Internal Medicine

## 2015-07-03 ENCOUNTER — Encounter: Payer: Self-pay | Admitting: Internal Medicine

## 2015-07-03 ENCOUNTER — Telehealth: Payer: Self-pay | Admitting: Internal Medicine

## 2015-07-03 NOTE — Telephone Encounter (Signed)
Re: Loma Sousa advised on neccestiy of Thyroid NM Therapy.   Per Courtney's conversation with NM Radiologist at [facility]. Patient's level is not high enough to recommend the NM Therapy. Patient to Continue thyroid medication and schedule follow up office visit with labs.  Thank you, Leonie Douglas Referral Coordinator  [facility] [phone] ext. [phone] Fax [phone]

## 2015-07-10 ENCOUNTER — Ambulatory Visit (INDEPENDENT_AMBULATORY_CARE_PROVIDER_SITE_OTHER): Payer: BLUE CROSS/BLUE SHIELD | Admitting: Internal Medicine

## 2015-07-10 ENCOUNTER — Encounter: Payer: Self-pay | Admitting: Internal Medicine

## 2015-07-10 DIAGNOSIS — E0590 Thyrotoxicosis, unspecified without thyrotoxic crisis or storm: Secondary | ICD-10-CM | POA: Diagnosis not present

## 2015-07-10 DIAGNOSIS — G609 Hereditary and idiopathic neuropathy, unspecified: Secondary | ICD-10-CM

## 2015-07-10 DIAGNOSIS — Z79899 Other long term (current) drug therapy: Secondary | ICD-10-CM

## 2015-07-10 LAB — BASIC METABOLIC PANEL WITH GFR
BUN: 14 mg/dL (ref 7–25)
CO2: 32 mmol/L — ABNORMAL HIGH (ref 20–31)
Calcium: 9.8 mg/dL (ref 8.6–10.4)
Chloride: 102 mmol/L (ref 98–110)
Creat: 0.87 mg/dL (ref 0.50–0.99)
GFR, Est African American: 81 mL/min (ref >=60)
GFR, Est Non African American: 71 mL/min (ref >=60)
GLUCOSE: 94 mg/dL (ref 65–99)
Potassium: 4.3 mmol/L (ref 3.5–5.3)
SODIUM: 140 mmol/L (ref 135–146)

## 2015-07-10 LAB — CBC WITH DIFFERENTIAL/PLATELET
Basophils Absolute: 0.1 10*3/uL (ref 0.0–0.1)
Basophils Relative: 1 % (ref 0–1)
EOS PCT: 4 % (ref 0–5)
Eosinophils Absolute: 0.2 10*3/uL (ref 0.0–0.7)
HCT: 39.8 % (ref 36.0–46.0)
Hemoglobin: 13.2 g/dL (ref 12.0–15.0)
Lymphocytes Relative: 45 % (ref 12–46)
Lymphs Abs: 2.5 10*3/uL (ref 0.7–4.0)
MCH: 31.4 pg (ref 26.0–34.0)
MCHC: 33.2 g/dL (ref 30.0–36.0)
MCV: 94.5 fL (ref 78.0–100.0)
MPV: 9.4 fL (ref 8.6–12.4)
Monocytes Absolute: 0.3 10*3/uL (ref 0.1–1.0)
Monocytes Relative: 6 % (ref 3–12)
Neutro Abs: 2.5 10*3/uL (ref 1.7–7.7)
Neutrophils Relative %: 44 % (ref 43–77)
Platelets: 270 10*3/uL (ref 150–400)
RBC: 4.21 MIL/uL (ref 3.87–5.11)
RDW: 13.2 % (ref 11.5–15.5)
WBC: 5.6 10*3/uL (ref 4.0–10.5)

## 2015-07-10 LAB — HEPATIC FUNCTION PANEL
ALT: 17 U/L (ref 6–29)
AST: 28 U/L (ref 10–35)
Albumin: 3.8 g/dL (ref 3.6–5.1)
Alkaline Phosphatase: 47 U/L (ref 33–130)
BILIRUBIN DIRECT: 0.1 mg/dL (ref <=0.2)
Indirect Bilirubin: 0.4 mg/dL (ref 0.2–1.2)
TOTAL PROTEIN: 6.6 g/dL (ref 6.1–8.1)
Total Bilirubin: 0.5 mg/dL (ref 0.2–1.2)

## 2015-07-10 MED ORDER — GABAPENTIN 100 MG PO CAPS: ORAL_CAPSULE | ORAL | Status: DC

## 2015-07-10 NOTE — Patient Instructions (Signed)
Gabapentin capsules or tablets What is this medicine? GABAPENTIN (GA ba pen tin) is used to control partial seizures in adults with epilepsy. It is also used to treat certain types of nerve pain. This medicine may be used for other purposes; ask your health care provider or pharmacist if you have questions. What should I tell my health care provider before I take this medicine? They need to know if you have any of these conditions: -kidney disease -suicidal thoughts, plans, or attempt; a previous suicide attempt by you or a family member -an unusual or allergic reaction to gabapentin, other medicines, foods, dyes, or preservatives -pregnant or trying to get pregnant -breast-feeding How should I use this medicine? Take this medicine by mouth with a glass of water. Follow the directions on the prescription label. You can take it with or without food. If it upsets your stomach, take it with food.Take your medicine at regular intervals. Do not take it more often than directed. Do not stop taking except on your doctor's advice. If you are directed to break the 600 or 800 mg tablets in half as part of your dose, the extra half tablet should be used for the next dose. If you have not used the extra half tablet within 28 days, it should be thrown away. A special MedGuide will be given to you by the pharmacist with each prescription and refill. Be sure to read this information carefully each time. Talk to your pediatrician regarding the use of this medicine in children. Special care may be needed. Overdosage: If you think you have taken too much of this medicine contact a poison control center or emergency room at once. NOTE: This medicine is only for you. Do not share this medicine with others. What if I miss a dose? If you miss a dose, take it as soon as you can. If it is almost time for your next dose, take only that dose. Do not take double or extra doses. What may interact with this medicine? Do not  take this medicine with any of the following medications: -other gabapentin products This medicine may also interact with the following medications: -alcohol -antacids -antihistamines for allergy, cough and cold -certain medicines for anxiety or sleep -certain medicines for depression or psychotic disturbances -homatropine; hydrocodone -naproxen -narcotic medicines (opiates) for pain -phenothiazines like chlorpromazine, mesoridazine, prochlorperazine, thioridazine This list may not describe all possible interactions. Give your health care provider a list of all the medicines, herbs, non-prescription drugs, or dietary supplements you use. Also tell them if you smoke, drink alcohol, or use illegal drugs. Some items may interact with your medicine. What should I watch for while using this medicine? Visit your doctor or health care professional for regular checks on your progress. You may want to keep a record at home of how you feel your condition is responding to treatment. You may want to share this information with your doctor or health care professional at each visit. You should contact your doctor or health care professional if your seizures get worse or if you have any new types of seizures. Do not stop taking this medicine or any of your seizure medicines unless instructed by your doctor or health care professional. Stopping your medicine suddenly can increase your seizures or their severity. Wear a medical identification bracelet or chain if you are taking this medicine for seizures, and carry a card that lists all your medications. You may get drowsy, dizzy, or have blurred vision. Do not drive, use   machinery, or do anything that needs mental alertness until you know how this medicine affects you. To reduce dizzy or fainting spells, do not sit or stand up quickly, especially if you are an older patient. Alcohol can increase drowsiness and dizziness. Avoid alcoholic drinks. Your mouth may get  dry. Chewing sugarless gum or sucking hard candy, and drinking plenty of water will help. The use of this medicine may increase the chance of suicidal thoughts or actions. Pay special attention to how you are responding while on this medicine. Any worsening of mood, or thoughts of suicide or dying should be reported to your health care professional right away. Women who become pregnant while using this medicine may enroll in the [place] Antiepileptic Drug Pregnancy Registry by calling 1-888-233-2334. This registry collects information about the safety of antiepileptic drug use during pregnancy. What side effects may I notice from receiving this medicine? Side effects that you should report to your doctor or health care professional as soon as possible: -allergic reactions like skin rash, itching or hives, swelling of the face, lips, or tongue -worsening of mood, thoughts or actions of suicide or dying Side effects that usually do not require medical attention (report to your doctor or health care professional if they continue or are bothersome): -constipation -difficulty walking or controlling muscle movements -dizziness -nausea -slurred speech -tiredness -tremors -weight gain This list may not describe all possible side effects. Call your doctor for medical advice about side effects. You may report side effects to FDA at 1-800-FDA-1088. Where should I keep my medicine? Keep out of reach of children. This medicine may cause accidental overdose and death if it taken by other adults, children, or pets. Mix any unused medicine with a substance like cat litter or coffee grounds. Then throw the medicine away in a sealed container like a sealed bag or a coffee can with a lid. Do not use the medicine after the expiration date. Store at room temperature between 15 and 30 degrees C (59 and 86 degrees F). NOTE: This sheet is a summary. It may not cover all possible information. If you have  questions about this medicine, talk to your doctor, pharmacist, or health care provider.    2016, Elsevier/Gold Standard. (2013-08-26 15:26:50)  

## 2015-07-10 NOTE — Progress Notes (Signed)
   Subjective:    Patient ID: Susan Carr, female    DOB: Aug 15, 1950, 64 y.o.   MRN: [id number]  HPI  Patient presents to the office for evaluation of her thyroid.  She reports that things haven't changed much.  She reports that she still has difficulty swallowing.  She feels like she is also having some burning in her feet.  She reports that it is intermittent.  She states that she thinks that it is neuropathy.  She reports otherwise she is doing well.  Chart review reveals punch biopsy of the nerves in her feet in 2012 which did show early neuropathic changes even then.  She has never tried anything for it.     Review of Systems  Constitutional: Negative for fever, chills and fatigue.  HENT: Negative for congestion, ear pain, postnasal drip, sore throat, trouble swallowing and voice change.   Eyes: Negative.   Respiratory: Negative for cough, chest tightness and shortness of breath.   Cardiovascular: Negative.   Gastrointestinal: Negative for nausea, vomiting, abdominal pain, diarrhea, constipation and blood in stool.  Genitourinary: Negative for urgency, frequency, hematuria, flank pain, enuresis and difficulty urinating.  Neurological: Positive for numbness. Negative for dizziness and light-headedness.  Psychiatric/Behavioral: Negative for confusion, decreased concentration and agitation.       Objective:   Physical Exam  Constitutional: She is oriented to person, place, and time. She appears well-developed and well-nourished. No distress.  HENT:  Head: Normocephalic.  Mouth/Throat: Oropharynx is clear and moist. No oropharyngeal exudate.  Eyes: Conjunctivae are normal. No scleral icterus.  Neck: Normal range of motion. Neck supple. No JVD present. No thyromegaly present.  Cardiovascular: Normal rate, regular rhythm, normal heart sounds and intact distal pulses.  Exam reveals no gallop and no friction rub.   No murmur heard. Pulmonary/Chest: Effort normal and breath sounds  normal. No respiratory distress. She has no wheezes. She has no rales. She exhibits no tenderness.  Abdominal: Soft. Bowel sounds are normal. She exhibits no distension and no mass. There is no tenderness. There is no rebound and no guarding.  Musculoskeletal: Normal range of motion.  Lymphadenopathy:    She has no cervical adenopathy.  Neurological: She is alert and oriented to person, place, and time.  Skin: Skin is warm and dry. She is not diaphoretic.  Psychiatric: She has a normal mood and affect. Her behavior is normal. Judgment and thought content normal.  Nursing note and vitals reviewed.   Filed Vitals:   07/10/15 1611  BP: 124/66  Pulse: 76  Temp: 98.2 F (36.8 C)  Resp: 16         Assessment & Plan:    1. Medication management  - CBC with Differential/Platelet - BASIC METABOLIC PANEL WITH GFR - Hepatic function panel  2. Hyperthyroidism -no current need for radio iodine ablation per radiologist as he believes that this will offer no relief.   - TSH   3.  Neuropathy -punch biopsy positive in 2012 per chart review -history consistent -try gabapentin at bedtime and slowly workup to 2 days per week.

## 2015-07-11 LAB — TSH: TSH: 0.261 u[IU]/mL — AB (ref 0.350–4.500)

## 2015-07-17 ENCOUNTER — Emergency Department (HOSPITAL_COMMUNITY): Payer: BLUE CROSS/BLUE SHIELD

## 2015-07-17 ENCOUNTER — Emergency Department (HOSPITAL_COMMUNITY)
Admission: EM | Admit: 2015-07-17 | Discharge: 2015-07-17 | Disposition: A | Discharge status: Home / Self Care | Payer: BLUE CROSS/BLUE SHIELD | Attending: Emergency Medicine | Admitting: Emergency Medicine

## 2015-07-17 ENCOUNTER — Encounter (HOSPITAL_COMMUNITY): Payer: Self-pay

## 2015-07-17 DIAGNOSIS — Y9389 Activity, other specified: Secondary | ICD-10-CM | POA: Insufficient documentation

## 2015-07-17 DIAGNOSIS — I10 Essential (primary) hypertension: Secondary | ICD-10-CM | POA: Diagnosis not present

## 2015-07-17 DIAGNOSIS — E785 Hyperlipidemia, unspecified: Secondary | ICD-10-CM | POA: Insufficient documentation

## 2015-07-17 DIAGNOSIS — S161XXA Strain of muscle, fascia and tendon at neck level, initial encounter: Secondary | ICD-10-CM | POA: Diagnosis not present

## 2015-07-17 DIAGNOSIS — S0990XA Unspecified injury of head, initial encounter: Secondary | ICD-10-CM

## 2015-07-17 DIAGNOSIS — S29001A Unspecified injury of muscle and tendon of front wall of thorax, initial encounter: Secondary | ICD-10-CM | POA: Insufficient documentation

## 2015-07-17 DIAGNOSIS — M1990 Unspecified osteoarthritis, unspecified site: Secondary | ICD-10-CM | POA: Insufficient documentation

## 2015-07-17 DIAGNOSIS — Y92410 Unspecified street and highway as the place of occurrence of the external cause: Secondary | ICD-10-CM | POA: Insufficient documentation

## 2015-07-17 DIAGNOSIS — Z79899 Other long term (current) drug therapy: Secondary | ICD-10-CM | POA: Insufficient documentation

## 2015-07-17 DIAGNOSIS — Z7982 Long term (current) use of aspirin: Secondary | ICD-10-CM | POA: Diagnosis not present

## 2015-07-17 DIAGNOSIS — Z9889 Other specified postprocedural states: Secondary | ICD-10-CM | POA: Insufficient documentation

## 2015-07-17 DIAGNOSIS — Y998 Other external cause status: Secondary | ICD-10-CM | POA: Diagnosis not present

## 2015-07-17 DIAGNOSIS — S199XXA Unspecified injury of neck, initial encounter: Secondary | ICD-10-CM | POA: Diagnosis present

## 2015-07-17 MED ORDER — IBUPROFEN 800 MG PO TABS
800.0000 mg | ORAL_TABLET | Freq: Once | ORAL | Status: AC
  Administered 2015-07-17: 800 mg via ORAL
  Filled 2015-07-17: qty 1

## 2015-07-17 MED ORDER — IBUPROFEN 800 MG PO TABS
800.0000 mg | ORAL_TABLET | Freq: Three times a day (TID) | ORAL | Status: DC | PRN
Start: 2015-07-17 — End: 2018-05-07

## 2015-07-17 MED ORDER — HYDROCODONE-ACETAMINOPHEN 5-325 MG PO TABS: 1.0000 | ORAL_TABLET | Freq: Four times a day (QID) | ORAL | Status: DC | PRN

## 2015-07-17 MED ORDER — ACETAMINOPHEN 500 MG PO TABS
1000.0000 mg | ORAL_TABLET | Freq: Once | ORAL | Status: AC
  Administered 2015-07-17: 1000 mg via ORAL
  Filled 2015-07-17: qty 2

## 2015-07-17 NOTE — ED Notes (Signed)
Patient transported to X-ray 
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
## 2015-07-17 NOTE — Discharge Instructions (Signed)
Return here as needed.  Follow-up with your primary care doctor.  Use ice and heat on the areas that are sore.  You can expect to have more soreness over the next 7-10 days

## 2015-07-17 NOTE — ED Notes (Signed)
[place] collar placed.
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
## 2015-07-17 NOTE — ED Notes (Signed)
Medium [place] collar placed.

## 2015-07-19 ENCOUNTER — Ambulatory Visit (INDEPENDENT_AMBULATORY_CARE_PROVIDER_SITE_OTHER): Payer: BLUE CROSS/BLUE SHIELD | Admitting: Internal Medicine

## 2015-07-19 ENCOUNTER — Encounter: Payer: Self-pay | Admitting: Internal Medicine

## 2015-07-19 VITALS — BP 126/74 | HR 70 | Temp 98.0°F | Resp 16 | Ht 63.0 in

## 2015-07-19 DIAGNOSIS — M25531 Pain in right wrist: Secondary | ICD-10-CM | POA: Diagnosis not present

## 2015-07-19 DIAGNOSIS — F4310 Post-traumatic stress disorder, unspecified: Secondary | ICD-10-CM | POA: Diagnosis not present

## 2015-07-19 DIAGNOSIS — M25551 Pain in right hip: Secondary | ICD-10-CM | POA: Diagnosis not present

## 2015-07-19 MED ORDER — ALPRAZOLAM 0.5 MG PO TABS: 0.5000 mg | ORAL_TABLET | Freq: Three times a day (TID) | ORAL | Status: AC | PRN

## 2015-07-19 MED ORDER — CELECOXIB 100 MG PO CAPS: 100.0000 mg | ORAL_CAPSULE | Freq: Every day | ORAL | Status: DC | PRN

## 2015-07-19 MED ORDER — TRAMADOL HCL 50 MG PO TABS: 50.0000 mg | ORAL_TABLET | Freq: Four times a day (QID) | ORAL | Status: DC | PRN

## 2015-07-19 NOTE — Patient Instructions (Signed)
Please ask the pharmacist to help you find a thumb spica splint.  I would like for you to wear this at all times unless you are showering or you hear otherwise from me.  Please take the ultram on an as needed basis for pain.  Please avoid heavy lifting  Please take celebrex once daily with a meal.  Do not take unless you have food on your stomach.  If you continue to have burning in your stomach please take 150 mg of ranitidine.  Please use heat on both your hand and your hip as needed to help with pain.  It is okay to take your gabapentin with celebrex.   Please take xanax on an as needed basis with your lunesta to help with sleeping.
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
## 2015-07-20 ENCOUNTER — Other Ambulatory Visit: Payer: Self-pay | Admitting: Internal Medicine

## 2015-07-20 ENCOUNTER — Ambulatory Visit (HOSPITAL_COMMUNITY)
Admission: RE | Admit: 2015-07-20 | Discharge: 2015-07-20 | Disposition: A | Discharge status: Home / Self Care | Payer: BLUE CROSS/BLUE SHIELD | Source: Ambulatory Visit | Attending: Internal Medicine | Admitting: Internal Medicine

## 2015-07-20 DIAGNOSIS — M79641 Pain in right hand: Secondary | ICD-10-CM | POA: Diagnosis present

## 2015-07-20 DIAGNOSIS — M25551 Pain in right hip: Secondary | ICD-10-CM

## 2015-07-20 DIAGNOSIS — M25531 Pain in right wrist: Secondary | ICD-10-CM

## 2015-07-20 DIAGNOSIS — R1031 Right lower quadrant pain: Secondary | ICD-10-CM | POA: Diagnosis present

## 2015-07-26 ENCOUNTER — Other Ambulatory Visit: Payer: Self-pay | Admitting: *Deleted

## 2015-07-26 MED ORDER — SUMATRIPTAN SUCCINATE 50 MG PO TABS: 50.0000 mg | ORAL_TABLET | ORAL | Status: DC | PRN

## 2015-07-26 MED ORDER — BUTALBITAL-APAP-CAFFEINE 50-325-40 MG PO TABS: ORAL_TABLET | ORAL | Status: DC

## 2015-08-14 ENCOUNTER — Ambulatory Visit (INDEPENDENT_AMBULATORY_CARE_PROVIDER_SITE_OTHER): Payer: BLUE CROSS/BLUE SHIELD | Admitting: Internal Medicine

## 2015-08-14 ENCOUNTER — Encounter: Payer: Self-pay | Admitting: Internal Medicine

## 2015-08-14 VITALS — BP 126/70 | HR 78 | Temp 98.0°F | Resp 16 | Ht 63.0 in

## 2015-08-14 DIAGNOSIS — M25531 Pain in right wrist: Secondary | ICD-10-CM

## 2015-08-14 DIAGNOSIS — M25551 Pain in right hip: Secondary | ICD-10-CM

## 2015-08-14 DIAGNOSIS — M25571 Pain in right ankle and joints of right foot: Secondary | ICD-10-CM | POA: Diagnosis not present

## 2015-08-14 MED ORDER — TRAMADOL HCL 50 MG PO TABS: 50.0000 mg | ORAL_TABLET | Freq: Four times a day (QID) | ORAL | Status: DC | PRN
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
## 2015-08-16 ENCOUNTER — Encounter: Payer: Self-pay | Admitting: Internal Medicine

## 2015-08-16 ENCOUNTER — Ambulatory Visit (HOSPITAL_COMMUNITY)
Admission: RE | Admit: 2015-08-16 | Discharge: 2015-08-16 | Disposition: A | Discharge status: Home / Self Care | Payer: BLUE CROSS/BLUE SHIELD | Source: Ambulatory Visit | Attending: Internal Medicine | Admitting: Internal Medicine

## 2015-08-16 DIAGNOSIS — M19071 Primary osteoarthritis, right ankle and foot: Secondary | ICD-10-CM | POA: Diagnosis not present

## 2015-08-16 DIAGNOSIS — M25571 Pain in right ankle and joints of right foot: Secondary | ICD-10-CM | POA: Diagnosis not present

## 2015-08-16 DIAGNOSIS — M25531 Pain in right wrist: Secondary | ICD-10-CM | POA: Diagnosis not present

## 2015-09-11 ENCOUNTER — Other Ambulatory Visit: Payer: Self-pay | Admitting: Physician Assistant

## 2015-09-12 ENCOUNTER — Encounter: Payer: Self-pay | Admitting: Internal Medicine

## 2015-09-12 ENCOUNTER — Ambulatory Visit (INDEPENDENT_AMBULATORY_CARE_PROVIDER_SITE_OTHER): Payer: BLUE CROSS/BLUE SHIELD | Admitting: Internal Medicine

## 2015-09-12 VITALS — BP 124/70 | HR 68 | Temp 98.2°F | Resp 16 | Ht 63.0 in

## 2015-09-12 DIAGNOSIS — G609 Hereditary and idiopathic neuropathy, unspecified: Secondary | ICD-10-CM | POA: Diagnosis not present

## 2015-09-12 NOTE — Progress Notes (Signed)
   Subjective:    Patient ID: Susan Carr, female    DOB: December 20, 1950, 65 y.o.   MRN: [id number]  Foot Pain Associated symptoms include arthralgias, myalgias and numbness. Pertinent negatives include no chills, fatigue, fever, rash or weakness.  Ankle Pain  Associated symptoms include numbness.  Patient presents to the office for evaluation of left ankle pain.  She reports that she has been having a lot more ankle pain and that she has been seeing ortho and she has also been seeing PT for it.  She reports that the ankle has not gotten much better.  She is basically her for her to get a letter for her to be able to go ahead and be able to get a handicap placcard and also to see if she can sit down during the day to help with the neuropathy.  She also needs some FMLA papers for her husband so she can take care of him.  She reports that she had to use sick time and vacation time to come to the doctor.      Review of Systems  Constitutional: Negative for fever, chills and fatigue.  Musculoskeletal: Positive for myalgias and arthralgias. Negative for gait problem.  Skin: Negative for color change and rash.  Neurological: Positive for numbness. Negative for dizziness, weakness and light-headedness.       Objective:   Physical Exam  Constitutional: She is oriented to person, place, and time. She appears well-developed and well-nourished. No distress.  HENT:  Head: Normocephalic.  Mouth/Throat: Oropharynx is clear and moist. No oropharyngeal exudate.  Eyes: Conjunctivae are normal. No scleral icterus.  Neck: Normal range of motion. Neck supple. No JVD present. No thyromegaly present.  Cardiovascular: Normal rate, regular rhythm, normal heart sounds and intact distal pulses.  Exam reveals no gallop and no friction rub.   No murmur heard. Pulmonary/Chest: Effort normal and breath sounds normal. No respiratory distress. She has no wheezes. She has no rales. She exhibits no tenderness.   Musculoskeletal:       Right ankle: She exhibits decreased range of motion. She exhibits no swelling, no ecchymosis, no deformity, no laceration and normal pulse. No tenderness. Achilles tendon normal.  Inappropriate sensation to light touch.  Non-tender to palpation.    Lymphadenopathy:    She has no cervical adenopathy.  Neurological: She is alert and oriented to person, place, and time.  Skin: Skin is warm and dry. She is not diaphoretic.  Psychiatric: She has a normal mood and affect. Her behavior is normal. Judgment and thought content normal.  Nursing note and vitals reviewed.   Filed Vitals:   09/12/15 1612  BP: 124/70  Pulse: 68  Temp: 98.2 F (36.8 C)  Resp: 16         Assessment & Plan:    1. Hereditary and idiopathic peripheral neuropathy -increase gabapentin to TID -work note to be able to sit down -cont PT -Handicap placcard -FMLA papers

## 2015-09-14 ENCOUNTER — Other Ambulatory Visit: Payer: Self-pay | Admitting: Internal Medicine

## 2015-09-21 ENCOUNTER — Other Ambulatory Visit: Payer: Self-pay | Admitting: Internal Medicine

## 2015-09-21 MED ORDER — GABAPENTIN 100 MG PO CAPS: ORAL_CAPSULE | ORAL | Status: DC

## 2015-09-28 DIAGNOSIS — M25531 Pain in right wrist: Secondary | ICD-10-CM | POA: Diagnosis not present

## 2015-10-09 DIAGNOSIS — M25551 Pain in right hip: Secondary | ICD-10-CM | POA: Diagnosis not present

## 2015-10-10 DIAGNOSIS — M25531 Pain in right wrist: Secondary | ICD-10-CM | POA: Diagnosis not present

## 2015-10-10 DIAGNOSIS — M25551 Pain in right hip: Secondary | ICD-10-CM | POA: Diagnosis not present

## 2015-10-16 DIAGNOSIS — M25551 Pain in right hip: Secondary | ICD-10-CM | POA: Diagnosis not present

## 2015-10-23 DIAGNOSIS — M25551 Pain in right hip: Secondary | ICD-10-CM | POA: Diagnosis not present

## 2015-11-14 DIAGNOSIS — M25531 Pain in right wrist: Secondary | ICD-10-CM | POA: Diagnosis not present

## 2015-12-12 DIAGNOSIS — M25531 Pain in right wrist: Secondary | ICD-10-CM | POA: Diagnosis not present

## 2015-12-27 ENCOUNTER — Other Ambulatory Visit: Payer: Self-pay | Admitting: Internal Medicine

## 2016-01-09 ENCOUNTER — Other Ambulatory Visit: Payer: Self-pay

## 2016-01-09 DIAGNOSIS — B029 Zoster without complications: Secondary | ICD-10-CM

## 2016-01-09 MED ORDER — ACYCLOVIR 200 MG PO CAPS: ORAL_CAPSULE | ORAL | Status: DC

## 2016-01-16 ENCOUNTER — Ambulatory Visit (INDEPENDENT_AMBULATORY_CARE_PROVIDER_SITE_OTHER): Payer: BLUE CROSS/BLUE SHIELD | Admitting: Internal Medicine

## 2016-01-16 ENCOUNTER — Encounter: Payer: Self-pay | Admitting: Internal Medicine

## 2016-01-16 VITALS — BP 126/70 | HR 82 | Temp 98.2°F | Resp 16 | Ht 63.0 in | Wt 168.0 lb

## 2016-01-16 DIAGNOSIS — J0140 Acute pansinusitis, unspecified: Secondary | ICD-10-CM | POA: Diagnosis not present

## 2016-01-16 DIAGNOSIS — E7800 Pure hypercholesterolemia, unspecified: Secondary | ICD-10-CM | POA: Diagnosis not present

## 2016-01-16 MED ORDER — PREDNISONE 20 MG PO TABS: ORAL_TABLET | ORAL | Status: DC

## 2016-01-16 MED ORDER — AZITHROMYCIN 250 MG PO TABS: ORAL_TABLET | ORAL | Status: DC

## 2016-01-16 MED ORDER — FLUTICASONE PROPIONATE 50 MCG/ACT NA SUSP: 2.0000 | Freq: Every day | NASAL | Status: DC

## 2016-01-16 MED ORDER — EZETIMIBE 10 MG PO TABS: 10.0000 mg | ORAL_TABLET | Freq: Every day | ORAL | Status: DC

## 2016-01-16 NOTE — Progress Notes (Signed)
HPI  Patient presents to the office for evaluation of left ear pain and congestion.  It has been going on for 4 days.  Patient reports no cough but nasal congestion and left ear pain.  They also endorse change in voice, postnasal drip and left ear pain.  .  They have tried sudafed.  She reports that this helps a little bit but not much.  They report that nothing has worked.  They denies other sick contacts.  She reports that her husband isn't sick.  She reports that her ankle and her hip are now back to normal.  She reports that she did therapy and this helped a lot.    Review of Systems  Constitutional: Negative for fever, chills and malaise/fatigue.  HENT: Positive for congestion and ear pain. Negative for ear discharge and sore throat.   Respiratory: Negative for cough, shortness of breath and wheezing.   Cardiovascular: Negative for chest pain, palpitations and leg swelling.  Gastrointestinal: Negative for heartburn, abdominal pain, diarrhea, constipation, blood in stool and melena.  Genitourinary: Negative.   Skin: Negative.   Neurological: Negative for dizziness, sensory change, loss of consciousness and headaches.  Psychiatric/Behavioral: Negative for depression. The patient is not nervous/anxious and does not have insomnia.     PE:  Filed Vitals:   01/16/16 1551  BP: 126/70  Pulse: 82  Temp: 98.2 F (36.8 C)  Resp: 16    General:  Alert and non-toxic, WDWN, NAD HEENT: NCAT, PERLA, EOM normal, no occular discharge or erythema.  Nasal mucosal edema with sinus tenderness to palpation.  Oropharynx clear with minimal oropharyngeal edema and erythema.  Mucous membranes moist and pink. Neck:  Cervical adenopathy Chest:  RRR no MRGs.  Lungs clear to auscultation A&P with no wheezes rhonchi or rales.   Abdomen: +BS x 4 quadrants, soft, non-tender, no guarding, rigidity, or rebound. Skin: warm and dry no rash Neuro: A&Ox4, CN II-XII grossly intact  Assessment and Plan:   1.  Acute pansinusitis, recurrence not specified -zpak - fluticasone (FLONASE) 50 MCG/ACT nasal spray; Place 2 sprays into both nostrils daily.  Dispense: 16 g; Refill: 0 - predniSONE (DELTASONE) 20 MG tablet; 3 tabs po day one, then 2 tabs daily x 4 days  Dispense: 11 tablet; Refill: 0 -cont sudafed -zyrtec

## 2016-01-22 ENCOUNTER — Other Ambulatory Visit (HOSPITAL_COMMUNITY)
Admission: RE | Admit: 2016-01-22 | Discharge: 2016-01-22 | Disposition: A | Discharge status: Home / Self Care | Payer: BLUE CROSS/BLUE SHIELD | Source: Ambulatory Visit | Attending: Obstetrics and Gynecology | Admitting: Obstetrics and Gynecology

## 2016-01-22 ENCOUNTER — Other Ambulatory Visit: Payer: Self-pay | Admitting: Obstetrics and Gynecology

## 2016-01-22 DIAGNOSIS — M810 Age-related osteoporosis without current pathological fracture: Secondary | ICD-10-CM | POA: Diagnosis not present

## 2016-01-22 DIAGNOSIS — Z01419 Encounter for gynecological examination (general) (routine) without abnormal findings: Secondary | ICD-10-CM | POA: Insufficient documentation

## 2016-01-22 DIAGNOSIS — Z1151 Encounter for screening for human papillomavirus (HPV): Secondary | ICD-10-CM | POA: Diagnosis not present

## 2016-01-22 DIAGNOSIS — M8588 Other specified disorders of bone density and structure, other site: Secondary | ICD-10-CM | POA: Diagnosis not present

## 2016-01-24 LAB — CYTOLOGY - PAP

## 2016-02-20 ENCOUNTER — Ambulatory Visit (INDEPENDENT_AMBULATORY_CARE_PROVIDER_SITE_OTHER): Payer: BLUE CROSS/BLUE SHIELD | Admitting: Internal Medicine

## 2016-02-20 ENCOUNTER — Encounter: Payer: Self-pay | Admitting: Internal Medicine

## 2016-02-20 VITALS — BP 126/62 | HR 66 | Temp 98.0°F | Resp 16 | Ht 63.0 in | Wt 165.0 lb

## 2016-02-20 DIAGNOSIS — Z79899 Other long term (current) drug therapy: Secondary | ICD-10-CM | POA: Diagnosis not present

## 2016-02-20 DIAGNOSIS — Z130 Encounter for screening for diseases of the blood and blood-forming organs and certain disorders involving the immune mechanism: Secondary | ICD-10-CM

## 2016-02-20 DIAGNOSIS — Z0000 Encounter for general adult medical examination without abnormal findings: Secondary | ICD-10-CM | POA: Diagnosis not present

## 2016-02-20 DIAGNOSIS — M810 Age-related osteoporosis without current pathological fracture: Secondary | ICD-10-CM | POA: Insufficient documentation

## 2016-02-20 DIAGNOSIS — E559 Vitamin D deficiency, unspecified: Secondary | ICD-10-CM | POA: Diagnosis not present

## 2016-02-20 DIAGNOSIS — Z136 Encounter for screening for cardiovascular disorders: Secondary | ICD-10-CM

## 2016-02-20 DIAGNOSIS — M25531 Pain in right wrist: Secondary | ICD-10-CM

## 2016-02-20 DIAGNOSIS — G4700 Insomnia, unspecified: Secondary | ICD-10-CM

## 2016-02-20 DIAGNOSIS — IMO0001 Reserved for inherently not codable concepts without codable children: Secondary | ICD-10-CM

## 2016-02-20 DIAGNOSIS — I10 Essential (primary) hypertension: Secondary | ICD-10-CM | POA: Insufficient documentation

## 2016-02-20 DIAGNOSIS — R7303 Prediabetes: Secondary | ICD-10-CM

## 2016-02-20 DIAGNOSIS — Z0001 Encounter for general adult medical examination with abnormal findings: Secondary | ICD-10-CM

## 2016-02-20 DIAGNOSIS — R030 Elevated blood-pressure reading, without diagnosis of hypertension: Secondary | ICD-10-CM

## 2016-02-20 DIAGNOSIS — E785 Hyperlipidemia, unspecified: Secondary | ICD-10-CM

## 2016-02-20 LAB — CBC WITH DIFFERENTIAL/PLATELET
Basophils Absolute: 40 cells/uL (ref 0–200)
Basophils Relative: 1 %
Eosinophils Absolute: 200 cells/uL (ref 15–500)
Eosinophils Relative: 5 %
HCT: 39.1 % (ref 35.0–45.0)
HEMOGLOBIN: 13.1 g/dL (ref 11.7–15.5)
LYMPHS ABS: 1840 {cells}/uL (ref 850–3900)
Lymphocytes Relative: 46 %
MCH: 32 pg (ref 27.0–33.0)
MCHC: 33.5 g/dL (ref 32.0–36.0)
MCV: 95.4 fL (ref 80.0–100.0)
MONO ABS: 320 {cells}/uL (ref 200–950)
MPV: 9.2 fL (ref 7.5–12.5)
Monocytes Relative: 8 %
Neutro Abs: 1600 cells/uL (ref 1500–7800)
Neutrophils Relative %: 40 %
Platelets: 273 10*3/uL (ref 140–400)
RBC: 4.1 MIL/uL (ref 3.80–5.10)
RDW: 13.1 % (ref 11.0–15.0)
WBC: 4 10*3/uL (ref 3.8–10.8)

## 2016-02-20 LAB — TSH: TSH: 0.87 mIU/L

## 2016-02-20 LAB — VITAMIN B12: Vitamin B-12: 481 pg/mL (ref 200–1100)

## 2016-02-20 MED ORDER — CELECOXIB 100 MG PO CAPS: 100.0000 mg | ORAL_CAPSULE | Freq: Every day | ORAL | 1 refills | Status: DC | PRN

## 2016-02-20 MED ORDER — ALENDRONATE SODIUM 70 MG PO TABS: 70.0000 mg | ORAL_TABLET | ORAL | 3 refills | Status: DC

## 2016-02-20 NOTE — Patient Instructions (Signed)
Pneumococcal Vaccine, Polyvalent suspension for injection  What is this medicine?  PNEUMOCOCCAL VACCINE (NEU mo KOK al vak SEEN) is a vaccine used to prevent pneumococcus bacterial infections. These bacteria can cause serious infections like pneumonia, meningitis, and blood infections. This vaccine will lower your chance of getting pneumonia. If you do get pneumonia, it can make your symptoms milder and your illness shorter. This vaccine will not treat an infection and will not cause infection. This vaccine is recommended for infants and young children, adults with certain medical conditions, and adults 65 years or older.  This medicine may be used for other purposes; ask your health care provider or pharmacist if you have questions.  What should I tell my health care provider before I take this medicine?  They need to know if you have any of these conditions:  -bleeding problems  -fever  -immune system problems  -an unusual or allergic reaction to pneumococcal vaccine, diphtheria toxoid, other vaccines, latex, other medicines, foods, dyes, or preservatives  -pregnant or trying to get pregnant  -breast-feeding  How should I use this medicine?  This vaccine is for injection into a muscle. It is given by a health care professional.  A copy of Vaccine Information Statements will be given before each vaccination. Read this sheet carefully each time. The sheet may change frequently.  Talk to your pediatrician regarding the use of this medicine in children. While this drug may be prescribed for children as young as 6 weeks old for selected conditions, precautions do apply.  Overdosage: If you think you have taken too much of this medicine contact a poison control center or emergency room at once.  NOTE: This medicine is only for you. Do not share this medicine with others.  What if I miss a dose?  It is important not to miss your dose. Call your doctor or health care professional if you are unable to keep an  appointment.  What may interact with this medicine?  -medicines for cancer chemotherapy  -medicines that suppress your immune function  -steroid medicines like prednisone or cortisone  This list may not describe all possible interactions. Give your health care provider a list of all the medicines, herbs, non-prescription drugs, or dietary supplements you use. Also tell them if you smoke, drink alcohol, or use illegal drugs. Some items may interact with your medicine.  What should I watch for while using this medicine?  Mild fever and pain should go away in 3 days or less. Report any unusual symptoms to your doctor or health care professional.  What side effects may I notice from receiving this medicine?  Side effects that you should report to your doctor or health care professional as soon as possible:  -allergic reactions like skin rash, itching or hives, swelling of the face, lips, or tongue  -breathing problems  -confused  -fast or irregular heartbeat  -fever over 102 degrees F  -seizures  -unusual bleeding or bruising  -unusual muscle weakness  Side effects that usually do not require medical attention (report to your doctor or health care professional if they continue or are bothersome):  -aches and pains  -diarrhea  -fever of 102 degrees F or less  -headache  -irritable  -loss of appetite  -pain, tender at site where injected  -trouble sleeping  This list may not describe all possible side effects. Call your doctor for medical advice about side effects. You may report side effects to FDA at 1-800-FDA-1088.  Where should I keep   my medicine?  This does not apply. This vaccine is given in a clinic, pharmacy, doctor's office, or other health care setting and will not be stored at home.  NOTE: This sheet is a summary. It may not cover all possible information. If you have questions about this medicine, talk to your doctor, pharmacist, or health care provider.     © 2016, Elsevier/Gold Standard. (2014-04-06  10:27:27)

## 2016-02-20 NOTE — Progress Notes (Signed)
Complete Physical  Assessment and Plan:   1. Encounter for general adult medical examination with abnormal findings  - CBC with Differential/Platelet - BASIC METABOLIC PANEL WITH GFR - Hepatic function panel - Magnesium  2. Right wrist pain  - celecoxib (CELEBREX) 100 MG capsule; Take 1 capsule (100 mg total) by mouth daily as needed.  Dispense: 90 capsule; Refill: 1  3. Hyperlipidemia -cont zetia -encouraged to take daily - Lipid panel  4. Prediabetes  - Hemoglobin A1c - Insulin, random  5. Elevated BP -cont to monitor - Urinalysis, Routine w reflex microscopic (not at [facility]) - Microalbumin / creatinine urine ratio - EKG 12-Lead - [place], RETROPERITNL ABD,  LTD - TSH  6. Encounter for long-term (current) use of medications -con monitoring labs quarterly  7. Insomnia -cont lunesta -cont gabapentin  8. Vitamin D deficiency -cont supplment daily - VITAMIN D 25 Hydroxy (Vit-D Deficiency, Fractures)  9. Screening for deficiency anemia  - Iron and TIBC - Vitamin B12  10.  Osteoporosis -cont fosamax   Discussed med's effects and SE's. Screening labs and tests as requested with regular follow-up as recommended.  HPI  65 y.o. female  presents for a complete physical.  Her blood pressure has been controlled at home, today their BP is BP: 126/62.  She does not workout. She denies chest pain, shortness of breath, dizziness.  She does not work out secondary to time constraints as she is working two jobs and is also taking care of her demented husband.    She is on cholesterol medication and denies myalgias. Her cholesterol is at goal. The cholesterol last visit was:  She does not take the zetia every day.  She tries to remember but sometimes she forgets it.   Lab Results  Component Value Date   CHOL 205 (H) 01/24/2015   HDL 72 01/24/2015   LDLCALC 118 (H) 01/24/2015   TRIG 74 01/24/2015   CHOLHDL 2.8 01/24/2015  .  She has been working on diet and exercise for  prediabetes, she is on bASA, she is on ACE/ARB and denies foot ulcerations, hyperglycemia, hypoglycemia , increased appetite, nausea, paresthesia of the feet, polydipsia, polyuria, visual disturbances, vomiting and weight loss. Last A1C in the office was:  Lab Results  Component Value Date   HGBA1C 5.9 (H) 01/24/2015    Patient is on Vitamin D supplement.   Lab Results  Component Value Date   VD25OH 51 01/24/2015     She is still having some issues with insomnia.  She did run out of her medication but she feels that she cannot relax because she is afraid her husband might get up and she will need to help him.    Current Medications:  Current Outpatient Prescriptions on File Prior to Visit  Medication Sig Dispense Refill  . alendronate (FOSAMAX) 70 MG tablet Take 1 tablet by mouth  every week 12 tablet 99  . ALPRAZolam (XANAX) 0.5 MG tablet Take 1 tablet (0.5 mg total) by mouth 3 (three) times daily as needed for sleep or anxiety. 30 tablet 0  . aspirin EC 81 MG tablet Take 81 mg by mouth daily.    . celecoxib (CELEBREX) 100 MG capsule Take 1 capsule (100 mg total) by mouth daily as needed. 30 capsule 1  . Cholecalciferol (VITAMIN D PO) Take 5,000 Units by mouth daily.    . eszopiclone (LUNESTA) 2 MG TABS tablet TAKE ONE TABLET BY MOUTH AT BEDTIME AS NEEDED FOR SLEEP 90 tablet 1  .  ezetimibe (ZETIA) 10 MG tablet Take 1 tablet (10 mg total) by mouth daily. 90 tablet 3  . fish oil-omega-3 fatty acids 1000 MG capsule Take 1 g by mouth every morning.    . gabapentin (NEURONTIN) 100 MG capsule Take 1 to 2 capsules at bedtime 180 capsule 1  . Glycerin-Polysorbate 80 (REFRESH DRY EYE THERAPY OP) Apply 1 drop to eye daily as needed (dry eyes).    Marland Kitchen ibuprofen (ADVIL,MOTRIN) 800 MG tablet Take 1 tablet (800 mg total) by mouth every 8 (eight) hours as needed. 21 tablet 0  . Multiple Vitamin (MULTIVITAMIN WITH MINERALS) TABS tablet Take 1 tablet by mouth daily.    . vitamin C (ASCORBIC ACID) 500 MG  tablet Take 500 mg by mouth 2 (two) times daily.    . vitamin E 100 UNIT capsule Take 100 Units by mouth every morning.     No current facility-administered medications on file prior to visit.     Health Maintenance:   Immunization History  Administered Date(s) Administered  . Tdap 08/16/2012    Tetanus: 2014 Pneumovax: Declined Zostavax: Declined MGM: 2015 DEXA: Declined Colonoscopy: Wants cologuard Last Eye Exam:  [facility]  Patient Care Team: Unk Pinto, MD as PCP - General (Internal Medicine) Barbaraann Cao, OD as Referring Physician (Optometry) Clarene Essex, MD as Consulting Physician (Gastroenterology) Christophe Louis, MD as Consulting Physician (Obstetrics and Gynecology) Melrose Nakayama, MD as Consulting Physician (Orthopedic Surgery) Dorna Leitz, MD as Consulting Physician (Orthopedic Surgery)  Allergies:  Allergies  Allergen Reactions  . Cymbalta [Duloxetine Hcl]     Pt unaware of any reaction    Medical History:  Past Medical History:  Diagnosis Date  . Arthritis   . Hyperlipidemia   . Labile hypertension     Surgical History:  Past Surgical History:  Procedure Laterality Date  . NECK SURGERY      Family History:  Family History  Problem Relation Age of Onset  . Heart attack Mother   . Diabetes Mother   . Heart attack Father   . Diabetes Other   . Multiple sclerosis Other     Social History:  Social History  Substance Use Topics  . Smoking status: Never Smoker  . Smokeless tobacco: Never Used  . Alcohol use No    Review of Systems: Review of Systems  Constitutional: Negative for chills, fever and malaise/fatigue.  HENT: Negative for congestion, ear pain and sore throat.   Eyes: Negative.   Respiratory: Negative for cough, shortness of breath and wheezing.   Cardiovascular: Negative for chest pain, palpitations and leg swelling.  Gastrointestinal: Negative for abdominal pain, blood in stool, constipation, diarrhea, heartburn  and melena.  Genitourinary: Negative.   Skin: Negative.   Neurological: Negative for dizziness, sensory change, loss of consciousness and headaches.  Psychiatric/Behavioral: Negative for depression. The patient is not nervous/anxious and does not have insomnia.     Physical Exam: Estimated body mass index is 29.23 kg/m as calculated from the following:   Height as of this encounter: 5\' 3"  (1.6 m).   Weight as of this encounter: 165 lb (74.8 kg). BP 126/62   Pulse 66   Temp 98 F (36.7 C) (Temporal)   Resp 16   Ht 5\' 3"  (1.6 m)   Wt 165 lb (74.8 kg)   LMP  (Exact Date)   BMI 29.23 kg/m   General Appearance: Well nourished well developed, in no apparent distress.  Eyes: PERRLA, EOMs, conjunctiva no swelling or erythema ENT/Mouth:  Ear canals normal without obstruction, swelling, erythema, or discharge.  TMs normal bilaterally with no erythema, bulging, retraction, or loss of landmark.  Oropharynx moist and clear with no exudate, erythema, or swelling.   Neck: Supple, thyroid normal. No bruits.  No cervical adenopathy Respiratory: Respiratory effort normal, Breath sounds clear A&P without wheeze, rhonchi, rales.   Cardio: RRR without murmurs, rubs or gallops. Brisk peripheral pulses without edema.  Chest: symmetric, with normal excursions Breasts: Scarring to bilateral breast from prior reduction, Symmetric, without lumps, nipple discharge, retractions.  Abdomen: Soft, nontender, no guarding, rebound, hernias, masses, or organomegaly.  Lymphatics: Non tender without lymphadenopathy.  Genitourinary:  Musculoskeletal: Full ROM all peripheral extremities,5/5 strength, and normal gait.  Skin: Warm, dry without rashes, lesions, ecchymosis. Neuro: Awake and oriented X 3, Cranial nerves intact, reflexes equal bilaterally. Normal muscle tone, no cerebellar symptoms. Sensation intact.  Psych:  normal affect, Insight and Judgment appropriate.   EKG: WNL no changes.  AORTA SCAN: WNL    Over 40 minutes of exam, counseling, chart review and critical decision making was performed  Susan Carr 9:32 AM [facility]

## 2016-02-21 LAB — HEMOGLOBIN A1C
Hgb A1c MFr Bld: 5.6 % (ref <5.7)
Mean Plasma Glucose: 114 mg/dL

## 2016-02-21 LAB — HEPATIC FUNCTION PANEL
ALT: 14 U/L (ref 6–29)
AST: 23 U/L (ref 10–35)
Albumin: 4.2 g/dL (ref 3.6–5.1)
Alkaline Phosphatase: 37 U/L (ref 33–130)
Bilirubin, Direct: 0.1 mg/dL (ref <=0.2)
Indirect Bilirubin: 0.8 mg/dL (ref 0.2–1.2)
Total Bilirubin: 0.9 mg/dL (ref 0.2–1.2)
Total Protein: 6.5 g/dL (ref 6.1–8.1)

## 2016-02-21 LAB — MICROALBUMIN / CREATININE URINE RATIO: Creatinine, Urine: 71 mg/dL (ref 20–320)

## 2016-02-21 LAB — URINALYSIS, ROUTINE W REFLEX MICROSCOPIC
Bilirubin Urine: NEGATIVE
GLUCOSE, UA: NEGATIVE
HGB URINE DIPSTICK: NEGATIVE
Ketones, ur: NEGATIVE
Leukocytes, UA: NEGATIVE
Nitrite: NEGATIVE
PH: 7.5 (ref 5.0–8.0)
PROTEIN: NEGATIVE
Specific Gravity, Urine: 1.012 (ref 1.001–1.035)

## 2016-02-21 LAB — VITAMIN D 25 HYDROXY (VIT D DEFICIENCY, FRACTURES): VIT D 25 HYDROXY: 49 ng/mL (ref 30–100)

## 2016-02-21 LAB — BASIC METABOLIC PANEL WITH GFR
BUN: 14 mg/dL (ref 7–25)
CHLORIDE: 105 mmol/L (ref 98–110)
CO2: 26 mmol/L (ref 20–31)
Calcium: 9.3 mg/dL (ref 8.6–10.4)
Creat: 0.86 mg/dL (ref 0.50–0.99)
GFR, Est African American: 82 mL/min (ref >=60)
GFR, Est Non African American: 71 mL/min (ref >=60)
Glucose, Bld: 77 mg/dL (ref 65–99)
Potassium: 3.7 mmol/L (ref 3.5–5.3)
Sodium: 140 mmol/L (ref 135–146)

## 2016-02-21 LAB — MAGNESIUM: MAGNESIUM: 2.2 mg/dL (ref 1.5–2.5)

## 2016-02-21 LAB — LIPID PANEL
Cholesterol: 194 mg/dL (ref 125–200)
HDL: 55 mg/dL (ref >=46)
LDL Cholesterol: 119 mg/dL (ref <130)
Total CHOL/HDL Ratio: 3.5 Ratio (ref <=5.0)
Triglycerides: 99 mg/dL (ref <150)
VLDL: 20 mg/dL (ref <30)

## 2016-02-21 LAB — IRON AND TIBC
%SAT: 54 % — AB (ref 11–50)
Iron: 143 ug/dL (ref 45–160)
TIBC: 265 ug/dL (ref 250–450)
UIBC: 122 ug/dL — ABNORMAL LOW (ref 125–400)

## 2016-02-21 LAB — INSULIN, RANDOM: Insulin: 7.1 u[IU]/mL (ref 2.0–19.6)

## 2016-03-04 LAB — COLOGUARD: Cologuard: POSITIVE

## 2016-03-13 ENCOUNTER — Other Ambulatory Visit: Payer: Self-pay | Admitting: Internal Medicine

## 2016-03-13 DIAGNOSIS — Z1211 Encounter for screening for malignant neoplasm of colon: Secondary | ICD-10-CM

## 2016-03-13 LAB — COLOGUARD

## 2016-03-13 NOTE — Progress Notes (Signed)
Needs appointment for colonoscopy.  Positive cologuard test on 03/06/16.

## 2016-03-17 ENCOUNTER — Other Ambulatory Visit: Payer: Self-pay | Admitting: Internal Medicine

## 2016-03-17 ENCOUNTER — Encounter: Payer: Self-pay | Admitting: *Deleted

## 2016-03-25 ENCOUNTER — Other Ambulatory Visit: Payer: Self-pay | Admitting: Internal Medicine

## 2016-03-25 NOTE — Telephone Encounter (Signed)
Rx called in to [facility] pharmacy.

## 2016-03-26 ENCOUNTER — Ambulatory Visit: Payer: BLUE CROSS/BLUE SHIELD | Admitting: *Deleted

## 2016-03-26 DIAGNOSIS — Z79899 Other long term (current) drug therapy: Secondary | ICD-10-CM | POA: Diagnosis not present

## 2016-03-26 DIAGNOSIS — R899 Unspecified abnormal finding in specimens from other organs, systems and tissues: Secondary | ICD-10-CM

## 2016-03-27 LAB — IRON AND TIBC
%SAT: 18 % (ref 11–50)
Iron: 50 ug/dL (ref 45–160)
TIBC: 275 ug/dL (ref 250–450)
UIBC: 225 ug/dL (ref 125–400)

## 2016-04-15 ENCOUNTER — Telehealth: Payer: Self-pay | Admitting: Internal Medicine

## 2016-04-15 NOTE — Telephone Encounter (Signed)
Jury duty letter was sent to patient.

## 2016-04-25 DIAGNOSIS — R195 Other fecal abnormalities: Secondary | ICD-10-CM | POA: Diagnosis not present

## 2016-04-25 DIAGNOSIS — K210 Gastro-esophageal reflux disease with esophagitis: Secondary | ICD-10-CM | POA: Diagnosis not present

## 2016-05-25 ENCOUNTER — Other Ambulatory Visit: Payer: Self-pay | Admitting: Internal Medicine

## 2016-05-26 ENCOUNTER — Telehealth: Payer: Self-pay

## 2016-05-26 NOTE — Telephone Encounter (Signed)
Pt's LUNESTA was called into [place].

## 2016-05-28 ENCOUNTER — Other Ambulatory Visit: Payer: Self-pay | Admitting: Internal Medicine

## 2016-05-28 MED ORDER — RANITIDINE HCL 300 MG PO TABS: 300.0000 mg | ORAL_TABLET | Freq: Every day | ORAL | 1 refills | Status: DC

## 2016-06-02 ENCOUNTER — Telehealth: Payer: Self-pay | Admitting: *Deleted

## 2016-06-02 NOTE — Telephone Encounter (Signed)
Per Vicie Mutters, PA, the patient should take her Ranitidine 300 mg 1 tablet at bedtime.  Patient is aware.

## 2016-06-23 ENCOUNTER — Other Ambulatory Visit: Payer: Self-pay | Admitting: Physician Assistant

## 2016-06-23 NOTE — Telephone Encounter (Signed)
Please call lunesta 

## 2016-07-21 ENCOUNTER — Other Ambulatory Visit: Payer: Self-pay | Admitting: Internal Medicine

## 2016-07-22 ENCOUNTER — Ambulatory Visit (INDEPENDENT_AMBULATORY_CARE_PROVIDER_SITE_OTHER): Payer: BLUE CROSS/BLUE SHIELD | Admitting: Internal Medicine

## 2016-07-22 ENCOUNTER — Ambulatory Visit: Payer: Self-pay | Admitting: Internal Medicine

## 2016-07-22 ENCOUNTER — Encounter: Payer: Self-pay | Admitting: Internal Medicine

## 2016-07-22 VITALS — BP 116/64 | HR 90 | Temp 98.2°F | Resp 16 | Ht 63.0 in | Wt 167.0 lb

## 2016-07-22 DIAGNOSIS — H6592 Unspecified nonsuppurative otitis media, left ear: Secondary | ICD-10-CM | POA: Diagnosis not present

## 2016-07-22 DIAGNOSIS — K219 Gastro-esophageal reflux disease without esophagitis: Secondary | ICD-10-CM

## 2016-07-22 MED ORDER — DEXAMETHASONE SODIUM PHOSPHATE 10 MG/ML IJ SOLN
10.0000 mg | Freq: Once | INTRAMUSCULAR | Status: AC
  Administered 2016-07-22: 10 mg via INTRAMUSCULAR

## 2016-07-22 MED ORDER — FLUTICASONE PROPIONATE 50 MCG/ACT NA SUSP: 2.0000 | Freq: Every day | NASAL | 0 refills | Status: DC

## 2016-07-22 MED ORDER — RANITIDINE HCL 300 MG PO TABS: 300.0000 mg | ORAL_TABLET | Freq: Two times a day (BID) | ORAL | 1 refills | Status: DC

## 2016-07-22 NOTE — Patient Instructions (Signed)
Please take 25 mg of benadryl at bedtime.    Please take 1 tablet of zyrtec, claritin, or allegra daily.  Please use flonase two sprays per nostril right before bedtime.  Please use saline spray in your nose as often as you can.  Please take the 300 mg ranitidine once in the morning and once in the evening.   Please take 20 mg of omeprazole daily in the morning for 2 weeks until you run out of samples.

## 2016-07-22 NOTE — Progress Notes (Signed)
Subjective:    Patient ID: Susan Carr, female    DOB: Apr 15, 1951, 66 y.o.   MRN: [id number]   Patient reports for left ear pain x 1 week.  It has been getting slightly better.  It is a chronic aching sensation to the left side.  No radiation of pain.  She has had no drainage.  She has been keeping cotton in it.  She has had some sinus congestion.  She had not had sore throat or fevers and chills.  She notes that she has had exposure to her husband.  She has been taking mucinex and nyquil and dayquil.  She notes that it is somewhat better.  She has also been doing some theraflu.    She also notes that she is still having trouble with her acid reflux as well.  She has only been taking ranitidine once daily.  She is still taking celebrex for her arthritis and ankle pain.    Review of Systems  Constitutional: Negative for chills, fatigue and fever.  HENT: Positive for ear pain, postnasal drip and rhinorrhea. Negative for congestion, ear discharge, facial swelling, sinus pain, sinus pressure and sore throat.   Respiratory: Negative for chest tightness and shortness of breath.   Cardiovascular: Negative for chest pain and palpitations.  Gastrointestinal: Positive for abdominal pain. Negative for anal bleeding, constipation, diarrhea, nausea and vomiting.       Objective:   Physical Exam  Constitutional: She is oriented to person, place, and time. She appears well-developed and well-nourished. No distress.  HENT:  Head: Normocephalic.  Right Ear: Tympanic membrane normal.  Left Ear: Tympanic membrane normal.  Nose: Mucosal edema present. Right sinus exhibits no maxillary sinus tenderness and no frontal sinus tenderness. Left sinus exhibits no maxillary sinus tenderness and no frontal sinus tenderness.  Mouth/Throat: Uvula is midline, oropharynx is clear and moist and mucous membranes are normal. No trismus in the jaw. No oropharyngeal exudate.  Eyes: Conjunctivae are normal. No scleral  icterus.  Neck: Normal range of motion. Neck supple. No JVD present. No thyromegaly present.  Cardiovascular: Normal rate, regular rhythm, normal heart sounds and intact distal pulses.  Exam reveals no gallop and no friction rub.   No murmur heard. Pulmonary/Chest: Effort normal and breath sounds normal. No respiratory distress. She has no wheezes. She has no rales. She exhibits no tenderness.  Abdominal: Soft. Bowel sounds are normal. She exhibits no distension and no mass. There is no tenderness. There is no rebound and no guarding.  Musculoskeletal: Normal range of motion.  Lymphadenopathy:    She has no cervical adenopathy.  Neurological: She is alert and oriented to person, place, and time.  Skin: Skin is warm and dry. She is not diaphoretic.  Psychiatric: She has a normal mood and affect. Her behavior is normal. Judgment and thought content normal.  Nursing note and vitals reviewed.   Vitals:   07/22/16 1614  BP: 116/64  Pulse: 90  Resp: 16  Temp: 98.2 F (36.8 C)          Assessment & Plan:    1. Middle ear effusion, left -restart daily antihistamine -can take sudafed if desired - dexamethasone (DECADRON) injection 10 mg; Inject 1 mL (10 mg total) into the muscle once. - fluticasone (FLONASE) 50 MCG/ACT nasal spray; Place 2 sprays into both nostrils daily.  Dispense: 16 g; Refill: 0  2. Gastroesophageal reflux disease without esophagitis -2 weeks of omeprazole 20 mg daily  -cont ranitidine BID -may need to  stop NSAIDs completely.

## 2016-08-11 DIAGNOSIS — M79644 Pain in right finger(s): Secondary | ICD-10-CM | POA: Diagnosis not present

## 2016-08-26 ENCOUNTER — Ambulatory Visit (INDEPENDENT_AMBULATORY_CARE_PROVIDER_SITE_OTHER): Payer: BLUE CROSS/BLUE SHIELD | Admitting: Internal Medicine

## 2016-08-26 ENCOUNTER — Ambulatory Visit: Payer: Self-pay | Admitting: Internal Medicine

## 2016-08-26 ENCOUNTER — Encounter: Payer: Self-pay | Admitting: Internal Medicine

## 2016-08-26 VITALS — BP 124/72 | HR 64 | Temp 97.8°F | Resp 16 | Ht 63.0 in | Wt 167.0 lb

## 2016-08-26 DIAGNOSIS — Z79899 Other long term (current) drug therapy: Secondary | ICD-10-CM

## 2016-08-26 DIAGNOSIS — I10 Essential (primary) hypertension: Secondary | ICD-10-CM

## 2016-08-26 DIAGNOSIS — E782 Mixed hyperlipidemia: Secondary | ICD-10-CM | POA: Diagnosis not present

## 2016-08-26 DIAGNOSIS — Z730 Burn-out: Secondary | ICD-10-CM

## 2016-08-26 DIAGNOSIS — R7303 Prediabetes: Secondary | ICD-10-CM

## 2016-08-26 DIAGNOSIS — M810 Age-related osteoporosis without current pathological fracture: Secondary | ICD-10-CM

## 2016-08-26 LAB — CBC WITH DIFFERENTIAL/PLATELET
Basophils Absolute: 0 cells/uL (ref 0–200)
Basophils Relative: 0 %
Eosinophils Absolute: 150 cells/uL (ref 15–500)
Eosinophils Relative: 3 %
HCT: 38.9 % (ref 35.0–45.0)
Hemoglobin: 13.1 g/dL (ref 11.7–15.5)
LYMPHS PCT: 49 %
Lymphs Abs: 2450 cells/uL (ref 850–3900)
MCH: 32.1 pg (ref 27.0–33.0)
MCHC: 33.7 g/dL (ref 32.0–36.0)
MCV: 95.3 fL (ref 80.0–100.0)
MONO ABS: 400 {cells}/uL (ref 200–950)
MPV: 9.2 fL (ref 7.5–12.5)
Monocytes Relative: 8 %
NEUTROS PCT: 40 %
Neutro Abs: 2000 cells/uL (ref 1500–7800)
PLATELETS: 253 10*3/uL (ref 140–400)
RBC: 4.08 MIL/uL (ref 3.80–5.10)
RDW: 13.3 % (ref 11.0–15.0)
WBC: 5 10*3/uL (ref 3.8–10.8)

## 2016-08-26 NOTE — Progress Notes (Signed)
Assessment and Plan:  Hypertension:  -monitor blood pressure at home.  -Continue DASH diet.   -Reminder to go to the ER if any CP, SOB, nausea, dizziness, severe HA, changes vision/speech, left arm numbness and tingling, and jaw pain.  Cholesterol: -Continue diet and exercise.  -Check cholesterol.   Pre-diabetes: -Continue diet and exercise.  -Check A1C  Vitamin D Def: -continue medications.   Osteoporosis  -cont fosamax  Caretaker burnout -discussed care of her husband today and tried to provide reassurance.     Continue diet and meds as discussed. Further disposition pending results of labs.  HPI 66 y.o. female  presents for 3 month follow up with hypertension, hyperlipidemia, prediabetes and vitamin D.   Her blood pressure has been controlled at home, today their BP is BP: 124/72.   She does not workout. She denies chest pain, shortness of breath, dizziness.   She is on cholesterol medication and denies myalgias. Her cholesterol is at goal. The cholesterol last visit was:   Lab Results  Component Value Date   CHOL 200 (H) 08/26/2016   HDL 62 08/26/2016   LDLCALC 120 (H) 08/26/2016   TRIG 91 08/26/2016   CHOLHDL 3.2 08/26/2016     She has been working on diet and exercise for prediabetes, and denies foot ulcerations, hyperglycemia, hypoglycemia , increased appetite, nausea, paresthesia of the feet, polydipsia, polyuria, visual disturbances, vomiting and weight loss. Last A1C in the office was:  Lab Results  Component Value Date   HGBA1C 5.9 (H) 08/26/2016    Patient is on Vitamin D supplement.  Lab Results  Component Value Date   VD25OH 43 02/20/2016      She reports that she has been struggling to take care of her husband.  She reports that they checked him out after his most recent seizure.  She reports that they were able to go ahead and give him some fluids and this helped.    She is taking her fosamax.    Current Medications:  Current Outpatient  Prescriptions on File Prior to Visit  Medication Sig Dispense Refill  . alendronate (FOSAMAX) 70 MG tablet Take 1 tablet (70 mg total) by mouth once a week. Take with a full glass of water on an empty stomach. 12 tablet 3  . aspirin EC 81 MG tablet Take 81 mg by mouth daily.    . celecoxib (CELEBREX) 100 MG capsule Take 1 capsule (100 mg total) by mouth daily as needed. 90 capsule 1  . Cholecalciferol (VITAMIN D PO) Take 5,000 Units by mouth daily.    . eszopiclone (LUNESTA) 2 MG TABS tablet TAKE 1 TABLET BY MOUTH EVERY NIGHT AT BEDTIME AS NEEDED FOR SLEEP 30 tablet 2  . fish oil-omega-3 fatty acids 1000 MG capsule Take 1 g by mouth every morning.    . fluticasone (FLONASE) 50 MCG/ACT nasal spray Place 2 sprays into both nostrils daily. 16 g 0  . gabapentin (NEURONTIN) 100 MG capsule TAKE 1 TO 2 CAPSULES BY MOUTH AT BEDTIME 180 capsule 1  . Glycerin-Polysorbate 80 (REFRESH DRY EYE THERAPY OP) Apply 1 drop to eye daily as needed (dry eyes).    Marland Kitchen ibuprofen (ADVIL,MOTRIN) 800 MG tablet Take 1 tablet (800 mg total) by mouth every 8 (eight) hours as needed. 21 tablet 0  . ranitidine (ZANTAC) 300 MG tablet Take 1 tablet (300 mg total) by mouth 2 (two) times daily. 60 tablet 1  . vitamin C (ASCORBIC ACID) 500 MG tablet Take 500 mg by  mouth 2 (two) times daily.    . vitamin E 100 UNIT capsule Take 100 Units by mouth every morning.     No current facility-administered medications on file prior to visit.     Medical History:  Past Medical History:  Diagnosis Date  . Arthritis   . Hyperlipidemia   . Labile hypertension     Allergies:  Allergies  Allergen Reactions  . Cymbalta [Duloxetine Hcl]     Pt unaware of any reaction     Review of Systems:  Review of Systems  Constitutional: Negative for chills, fever and malaise/fatigue.  HENT: Negative for congestion, ear pain and sore throat.   Eyes: Negative.   Respiratory: Negative for cough, shortness of breath and wheezing.   Cardiovascular:  Negative for chest pain, palpitations and leg swelling.  Gastrointestinal: Negative for abdominal pain, blood in stool, constipation, diarrhea, heartburn and melena.  Genitourinary: Negative.   Skin: Negative.   Neurological: Negative for dizziness, sensory change, loss of consciousness and headaches.  Psychiatric/Behavioral: Negative for depression. The patient is not nervous/anxious and does not have insomnia.     Family history- Review and unchanged  Social history- Review and unchanged  Physical Exam: BP 124/72   Pulse 64   Temp 97.8 F (36.6 C) (Temporal)   Resp 16   Ht 5\' 3"  (1.6 m)   Wt 167 lb (75.8 kg)   BMI 29.58 kg/m  Wt Readings from Last 3 Encounters:  08/26/16 167 lb (75.8 kg)  07/22/16 167 lb (75.8 kg)  02/20/16 165 lb (74.8 kg)    General Appearance: Well nourished well developed, in no apparent distress. Eyes: PERRLA, EOMs, conjunctiva no swelling or erythema ENT/Mouth: Ear canals normal without obstruction, swelling, erythma, discharge.  TMs normal bilaterally.  Oropharynx moist, clear, without exudate, or postoropharyngeal swelling. Neck: Supple, thyroid normal,no cervical adenopathy  Respiratory: Respiratory effort normal, Breath sounds clear A&P without rhonchi, wheeze, or rale.  No retractions, no accessory usage. Cardio: RRR with no MRGs. Brisk peripheral pulses without edema.  Abdomen: Soft, + BS,  Non tender, no guarding, rebound, hernias, masses. Musculoskeletal: Full ROM, 5/5 strength, Normal gait Skin: Warm, dry without rashes, lesions, ecchymosis.  Neuro: Awake and oriented X 3, Cranial nerves intact. Normal muscle tone, no cerebellar symptoms. Psych: Normal affect, Insight and Judgment appropriate.    Starlyn Skeans, PA-C 10:59 AM [facility]

## 2016-08-27 LAB — LIPID PANEL
Cholesterol: 200 mg/dL — ABNORMAL HIGH (ref <200)
HDL: 62 mg/dL (ref >50)
LDL Cholesterol: 120 mg/dL — ABNORMAL HIGH (ref <100)
TRIGLYCERIDES: 91 mg/dL (ref <150)
Total CHOL/HDL Ratio: 3.2 Ratio (ref <5.0)
VLDL: 18 mg/dL (ref <30)

## 2016-08-27 LAB — BASIC METABOLIC PANEL WITH GFR
BUN: 15 mg/dL (ref 7–25)
CO2: 28 mmol/L (ref 20–31)
Calcium: 9.2 mg/dL (ref 8.6–10.4)
Chloride: 104 mmol/L (ref 98–110)
Creat: 0.89 mg/dL (ref 0.50–0.99)
GFR, EST AFRICAN AMERICAN: 79 mL/min (ref >=60)
GFR, EST NON AFRICAN AMERICAN: 68 mL/min (ref >=60)
Glucose, Bld: 119 mg/dL — ABNORMAL HIGH (ref 65–99)
POTASSIUM: 3.9 mmol/L (ref 3.5–5.3)
Sodium: 140 mmol/L (ref 135–146)

## 2016-08-27 LAB — HEPATIC FUNCTION PANEL
ALBUMIN: 3.9 g/dL (ref 3.6–5.1)
ALT: 18 U/L (ref 6–29)
AST: 22 U/L (ref 10–35)
Alkaline Phosphatase: 52 U/L (ref 33–130)
BILIRUBIN INDIRECT: 0.4 mg/dL (ref 0.2–1.2)
Bilirubin, Direct: 0.1 mg/dL (ref <=0.2)
Total Bilirubin: 0.5 mg/dL (ref 0.2–1.2)
Total Protein: 6.5 g/dL (ref 6.1–8.1)

## 2016-08-27 LAB — HEMOGLOBIN A1C
Hgb A1c MFr Bld: 5.9 % — ABNORMAL HIGH (ref <5.7)
Mean Plasma Glucose: 123 mg/dL

## 2016-08-27 LAB — TSH: TSH: 0.18 mIU/L — ABNORMAL LOW

## 2016-09-10 DIAGNOSIS — M79644 Pain in right finger(s): Secondary | ICD-10-CM | POA: Diagnosis not present

## 2016-09-12 ENCOUNTER — Other Ambulatory Visit: Payer: Self-pay | Admitting: Physician Assistant

## 2016-09-12 DIAGNOSIS — B029 Zoster without complications: Secondary | ICD-10-CM

## 2016-09-23 ENCOUNTER — Other Ambulatory Visit: Payer: Self-pay | Admitting: *Deleted

## 2016-09-23 MED ORDER — GABAPENTIN 100 MG PO CAPS: ORAL_CAPSULE | ORAL | 1 refills | Status: DC

## 2016-11-07 ENCOUNTER — Other Ambulatory Visit: Payer: Self-pay | Admitting: Internal Medicine

## 2016-11-07 DIAGNOSIS — Z1231 Encounter for screening mammogram for malignant neoplasm of breast: Secondary | ICD-10-CM

## 2016-11-14 ENCOUNTER — Other Ambulatory Visit: Payer: Self-pay | Admitting: Internal Medicine

## 2016-11-14 NOTE — Telephone Encounter (Signed)
Please call lunesta

## 2016-12-19 ENCOUNTER — Ambulatory Visit
Admission: RE | Admit: 2016-12-19 | Discharge: 2016-12-19 | Disposition: A | Discharge status: Home / Self Care | Payer: Medicare Other | Source: Ambulatory Visit | Attending: Internal Medicine | Admitting: Internal Medicine

## 2016-12-19 DIAGNOSIS — Z1231 Encounter for screening mammogram for malignant neoplasm of breast: Secondary | ICD-10-CM | POA: Diagnosis not present

## 2017-02-20 ENCOUNTER — Encounter: Payer: Self-pay | Admitting: Internal Medicine

## 2017-03-17 ENCOUNTER — Other Ambulatory Visit: Payer: Self-pay | Admitting: Internal Medicine

## 2017-03-17 NOTE — Telephone Encounter (Signed)
Renewal request Nat Math, [place]

## 2017-03-18 NOTE — Telephone Encounter (Signed)
LUNESTA CALLED INTO PHARMACY ON SEPT 5TH [email or web address].

## 2017-05-11 ENCOUNTER — Other Ambulatory Visit: Payer: Self-pay | Admitting: *Deleted

## 2017-05-11 MED ORDER — ALENDRONATE SODIUM 70 MG PO TABS: 70.0000 mg | ORAL_TABLET | ORAL | 3 refills | Status: DC

## 2017-05-11 NOTE — Progress Notes (Signed)
Medicare wellness and 3 month follow up  Assessment and Plan:  Hyperlipidemia, unspecified hyperlipidemia type -continue medications, check lipids, decrease fatty foods, increase activity.  -     Lipid panel  Prediabetes Discussed general issues about diabetes pathophysiology and management., Educational material distributed., Suggested low cholesterol diet., Encouraged aerobic exercise., Discussed foot care., Reminded to get yearly retinal exam. -     Hemoglobin A1c  Hypertension, unspecified type - continue medications, DASH diet, exercise and monitor at home. Call if greater than 130/80.  -     CBC with Differential/Platelet -     BASIC METABOLIC PANEL WITH GFR -     Hepatic function panel -     TSH  Age-related osteoporosis without current pathological fracture ? Need to come off fosamax, will check with doctor that prescribes  Vitamin D deficiency Continue supplement  Insomnia, unspecified type -     gabapentin (NEURONTIN) 100 MG capsule; Take 1-3 capsules at bedtime. - good sleep hygiene discussed  Encounter for long-term (current) use of medications -     Magnesium  Anxiety state Continue meds  [facility] visit EKG next OV due to time   Advanced care counseling/discussion Discussed advanced care planning with patient  Given papers for her and her husband she will fill out, get notarized and bring back At least 30 mins discussed with the patient   Discussed med's effects and SE's. Screening labs and tests as requested with regular follow-up as recommended. Over 40 minutes of exam, counseling, chart review and critical decision making was performed  During the course of the visit the patient was educated and counseled about appropriate screening and preventive services including:    Pneumococcal vaccine   Prevnar 13  Influenza vaccine  Td vaccine  Screening electrocardiogram  Bone densitometry screening  Colorectal cancer screening  Diabetes  screening  Glaucoma screening  Nutrition counseling   Advanced directives: requested  HPI  66 y.o. female  presents for wellness visit and 3 month follow up for HTN, chol, preDM, and obesity.   Her blood pressure has been controlled at home, today their BP is BP: 116/68.  She does not workout. She denies chest pain, shortness of breath, dizziness.  She does not work out secondary to time constraints as she is working two jobs and is also taking care of her demented husband.    She is not on cholesterol medication, was on zetia at one point. Her cholesterol is at goal. The cholesterol last visit was:   Lab Results  Component Value Date   CHOL 200 (H) 08/26/2016   HDL 62 08/26/2016   LDLCALC 120 (H) 08/26/2016   TRIG 91 08/26/2016   CHOLHDL 3.2 08/26/2016  .  She has been working on diet and exercise for prediabetes, she is on bASA, she is on ACE/ARB and denies foot ulcerations, hyperglycemia, hypoglycemia , increased appetite, nausea, paresthesia of the feet, polydipsia, polyuria, visual disturbances, vomiting and weight loss. Last A1C in the office was:  Lab Results  Component Value Date   HGBA1C 5.9 (H) 08/26/2016   She has been having some upset stomach, some trouble with getting deep breath, will have acid come up with burping.  Patient is on Vitamin D supplement.   Lab Results  Component Value Date   VD25OH 110 02/20/2016     She is still having some issues with insomnia but taking 2 of the gabapentin can help, she has some feet burning that this helps.  She feels that she  cannot relax because she is afraid her husband might get up and she will need to help him.   BMI is Body mass index is 28.06 kg/m., she is working on diet and exercise. Wt Readings from Last 3 Encounters:  05/12/17 158 lb 6.4 oz (71.8 kg)  08/26/16 167 lb (75.8 kg)  07/22/16 167 lb (75.8 kg)    Current Medications:  Current Outpatient Prescriptions on File Prior to Visit  Medication Sig Dispense  Refill  . acyclovir (ZOVIRAX) 200 MG capsule TAKE 2 CAPSULES BY MOUTH EVERY DAY AS DIRECTED 180 capsule 0  . alendronate (FOSAMAX) 70 MG tablet Take 1 tablet (70 mg total) by mouth once a week. Take with a full glass of water on an empty stomach. 12 tablet 3  . aspirin EC 81 MG tablet Take 81 mg by mouth daily.    . celecoxib (CELEBREX) 100 MG capsule Take 1 capsule (100 mg total) by mouth daily as needed. 90 capsule 1  . Cholecalciferol (VITAMIN D PO) Take 5,000 Units by mouth daily.    . eszopiclone (LUNESTA) 2 MG TABS tablet TAKE 1 TABLET BY MOUTH EVERY NIGHT AT BEDTIME AS NEEDED FOR SLEEP 30 tablet 2  . fish oil-omega-3 fatty acids 1000 MG capsule Take 1 g by mouth every morning.    . fluticasone (FLONASE) 50 MCG/ACT nasal spray Place 2 sprays into both nostrils daily. 16 g 0  . gabapentin (NEURONTIN) 100 MG capsule Take capsules at bedtime. 180 capsule 1  . Glycerin-Polysorbate 80 (REFRESH DRY EYE THERAPY OP) Apply 1 drop to eye daily as needed (dry eyes).    Marland Kitchen ibuprofen (ADVIL,MOTRIN) 800 MG tablet Take 1 tablet (800 mg total) by mouth every 8 (eight) hours as needed. 21 tablet 0  . ranitidine (ZANTAC) 300 MG tablet Take 1 tablet (300 mg total) by mouth 2 (two) times daily. 60 tablet 1  . vitamin C (ASCORBIC ACID) 500 MG tablet Take 500 mg by mouth 2 (two) times daily.    . vitamin E 100 UNIT capsule Take 100 Units by mouth every morning.     No current facility-administered medications on file prior to visit.     Health Maintenance:   Immunization History  Administered Date(s) Administered  . Tdap 08/16/2012   Tetanus: 2014 Pneumovax: Declined Zostavax: Declined  MGM: 12/2016 DEXA: Declined cologuard 03/04/2016 + Still needs colonoscopy Dr Watt Climes Last Eye Exam:  [facility] Sleep study 04/2014  Patient Care Team: Unk Pinto, MD as PCP - General (Internal Medicine) Barbaraann Cao, [employer] as Referring Physician (Optometry) Clarene Essex, MD as Consulting Physician  (Gastroenterology) Christophe Louis, MD as Consulting Physician (Obstetrics and Gynecology) Melrose Nakayama, MD as Consulting Physician (Orthopedic Surgery) Dorna Leitz, MD as Consulting Physician (Orthopedic Surgery)  Medical History:  Past Medical History:  Diagnosis Date  . Arthritis   . Hyperlipidemia   . Labile hypertension    Allergies Allergies  Allergen Reactions  . Cymbalta [Duloxetine Hcl]     Pt unaware of any reaction    SURGICAL HISTORY She  has a past surgical history that includes Neck surgery and Reduction mammaplasty (Bilateral, 2007). FAMILY HISTORY Her family history includes Diabetes in her mother and other; Heart attack in her father and mother; Multiple sclerosis in her other. SOCIAL HISTORY She  reports that she has never smoked. She has never used smokeless tobacco. She reports that she does not drink alcohol or use drugs.  MEDICARE WELLNESS OBJECTIVES: Physical activity: Current Exercise Habits: The patient  does not participate in regular exercise at present;The patient has a physically strenous job, but has no regular exercise apart from work. Cardiac risk factors: Cardiac Risk Factors include: advanced age (>70men, >49 women);dyslipidemia;hypertension;sedentary lifestyle Depression/mood screen:   Depression screen [facility] 2/9 05/12/2017  Decreased Interest 0  Down, Depressed, Hopeless 0  PHQ - 2 Score 0    ADLs:  In your present state of health, do you have any difficulty performing the following activities: 05/12/2017  Hearing? N  Vision? N  Difficulty concentrating or making decisions? N  Walking or climbing stairs? N  Dressing or bathing? N  Doing errands, shopping? N  Some recent data might be hidden     Cognitive Testing  Alert? Yes  Normal Appearance?Yes  Oriented to person? Yes  Place? Yes   Time? Yes  Recall of three objects?  Yes  Can perform simple calculations? Yes  Displays appropriate judgment?Yes  Can read the correct time from a  watch face?Yes  EOL planning: Does Patient Have a Medical Advance Directive?: No Would patient like information on creating a medical advance directive?: Yes (MAU/Ambulatory/Procedural Areas - Information given)  Review of Systems: Review of Systems  Constitutional: Negative for chills, fever and malaise/fatigue.  HENT: Negative for congestion, ear pain and sore throat.   Eyes: Negative.   Respiratory: Negative for cough, shortness of breath and wheezing.   Cardiovascular: Negative for chest pain, palpitations and leg swelling.  Gastrointestinal: Negative for abdominal pain, blood in stool, constipation, diarrhea, heartburn and melena.  Genitourinary: Negative.   Skin: Negative.   Neurological: Negative for dizziness, sensory change, loss of consciousness and headaches.  Psychiatric/Behavioral: Negative for depression. The patient is not nervous/anxious and does not have insomnia.     Physical Exam: Estimated body mass index is 28.06 kg/m as calculated from the following:   Height as of this encounter: 5\' 3"  (1.6 m).   Weight as of this encounter: 158 lb 6.4 oz (71.8 kg). BP 116/68   Pulse 64   Temp (!) 97.5 F (36.4 C)   Ht 5\' 3"  (1.6 m)   Wt 158 lb 6.4 oz (71.8 kg)   SpO2 98%   BMI 28.06 kg/m   General Appearance: Well nourished well developed, in no apparent distress.  Eyes: PERRLA, EOMs, conjunctiva no swelling or erythema ENT/Mouth: Ear canals normal without obstruction, swelling, erythema, or discharge.  TMs normal bilaterally with no erythema, bulging, retraction, or loss of landmark.  Oropharynx moist and clear with no exudate, erythema, or swelling.   Neck: Supple, thyroid normal. No bruits.  No cervical adenopathy Respiratory: Respiratory effort normal, Breath sounds clear A&P without wheeze, rhonchi, rales.   Cardio: RRR without murmurs, rubs or gallops. Brisk peripheral pulses without edema.  Chest: symmetric, with normal excursions Breasts: Scarring to bilateral  breast from prior reduction, Symmetric, without lumps, nipple discharge, retractions.  Abdomen: Soft, nontender, no guarding, rebound, hernias, masses, or organomegaly.  Lymphatics: Non tender without lymphadenopathy.  Genitourinary:  Musculoskeletal: Full ROM all peripheral extremities,5/5 strength, and normal gait.  Skin: Warm, dry without rashes, lesions, ecchymosis. Neuro: Awake and oriented X 3, Cranial nerves intact, reflexes equal bilaterally. Normal muscle tone, no cerebellar symptoms. Sensation intact.  Psych:  normal affect, Insight and Judgment appropriate.    Medicare Attestation I have personally reviewed: The patient's medical and social history Their use of alcohol, tobacco or illicit drugs Their current medications and supplements The patient's functional ability including ADLs,fall risks, home safety risks, cognitive, and hearing and visual  impairment Diet and physical activities Evidence for depression or mood disorders  The patient's weight, height, BMI, and visual acuity have been recorded in the chart.  I have made referrals, counseling, and provided education to the patient based on review of the above and I have provided the patient with a written personalized care plan for preventive services.    Vicie Mutters 5:09 PM [facility]

## 2017-05-12 ENCOUNTER — Ambulatory Visit (INDEPENDENT_AMBULATORY_CARE_PROVIDER_SITE_OTHER): Payer: Medicare Other | Admitting: Physician Assistant

## 2017-05-12 ENCOUNTER — Encounter: Payer: Self-pay | Admitting: Physician Assistant

## 2017-05-12 VITALS — BP 116/68 | HR 64 | Temp 97.5°F | Ht 63.0 in | Wt 158.4 lb

## 2017-05-12 DIAGNOSIS — Z79899 Other long term (current) drug therapy: Secondary | ICD-10-CM | POA: Diagnosis not present

## 2017-05-12 DIAGNOSIS — G4700 Insomnia, unspecified: Secondary | ICD-10-CM | POA: Diagnosis not present

## 2017-05-12 DIAGNOSIS — R7303 Prediabetes: Secondary | ICD-10-CM | POA: Diagnosis not present

## 2017-05-12 DIAGNOSIS — E785 Hyperlipidemia, unspecified: Secondary | ICD-10-CM | POA: Diagnosis not present

## 2017-05-12 DIAGNOSIS — R6889 Other general symptoms and signs: Secondary | ICD-10-CM | POA: Diagnosis not present

## 2017-05-12 DIAGNOSIS — E559 Vitamin D deficiency, unspecified: Secondary | ICD-10-CM

## 2017-05-12 DIAGNOSIS — I10 Essential (primary) hypertension: Secondary | ICD-10-CM | POA: Diagnosis not present

## 2017-05-12 DIAGNOSIS — Z7189 Other specified counseling: Secondary | ICD-10-CM

## 2017-05-12 DIAGNOSIS — Z0001 Encounter for general adult medical examination with abnormal findings: Secondary | ICD-10-CM | POA: Diagnosis not present

## 2017-05-12 DIAGNOSIS — M810 Age-related osteoporosis without current pathological fracture: Secondary | ICD-10-CM | POA: Diagnosis not present

## 2017-05-12 DIAGNOSIS — F411 Generalized anxiety disorder: Secondary | ICD-10-CM

## 2017-05-12 DIAGNOSIS — Z0000 Encounter for general adult medical examination without abnormal findings: Secondary | ICD-10-CM

## 2017-05-12 MED ORDER — GABAPENTIN 100 MG PO CAPS: ORAL_CAPSULE | ORAL | 1 refills | Status: DC

## 2017-05-12 NOTE — Patient Instructions (Addendum)
Get back on zantac 300 mg BID x 2 weeks, then can continue once a day Stop the tumeric and can restart in 2 weeks, can do pill, take with food    Avoid alcohol, spicy foods, NSAIDS (aleve, ibuprofen) at this time. See foods below.   Food Choices for Gastroesophageal Reflux Disease When you have gastroesophageal reflux disease (GERD), the foods you eat and your eating habits are very important. Choosing the right foods can help ease the discomfort of GERD. WHAT GENERAL GUIDELINES DO I NEED TO FOLLOW?  Choose fruits, vegetables, whole grains, low-fat dairy products, and low-fat meat, fish, and poultry.  Limit fats such as oils, salad dressings, butter, nuts, and avocado.  Keep a food diary to identify foods that cause symptoms.  Avoid foods that cause reflux. These may be different for different people.  Eat frequent small meals instead of three large meals each day.  Eat your meals slowly, in a relaxed setting.  Limit fried foods.  Cook foods using methods other than frying.  Avoid drinking alcohol.  Avoid drinking large amounts of liquids with your meals.  Avoid bending over or lying down until 2-3 hours after eating. WHAT FOODS ARE NOT RECOMMENDED? The following are some foods and drinks that may worsen your symptoms: Vegetables Tomatoes. Tomato juice. Tomato and spaghetti sauce. Chili peppers. Onion and garlic. Horseradish. Fruits Oranges, grapefruit, and lemon (fruit and juice). Meats High-fat meats, fish, and poultry. This includes hot dogs, ribs, ham, sausage, salami, and bacon. Dairy Whole milk and chocolate milk. Sour cream. Cream. Butter. Ice cream. Cream cheese.  Beverages Coffee and tea, with or without caffeine. Carbonated beverages or energy drinks. Condiments Hot sauce. Barbecue sauce.  Sweets/Desserts Chocolate and cocoa. Donuts. Peppermint and spearmint. Fats and Oils High-fat foods, including [place] fries and potato chips. Other Vinegar. Strong spices,  such as black pepper, white pepper, red pepper, cayenne, curry powder, cloves, ginger, and chili powder.

## 2017-05-13 LAB — LIPID PANEL
Cholesterol: 197 mg/dL (ref <200)
HDL: 76 mg/dL (ref >50)
LDL Cholesterol (Calc): 102 mg/dL (calc) — ABNORMAL HIGH
Non-HDL Cholesterol (Calc): 121 mg/dL (calc) (ref <130)
TRIGLYCERIDES: 96 mg/dL (ref <150)
Total CHOL/HDL Ratio: 2.6 (calc) (ref <5.0)

## 2017-05-13 LAB — HEMOGLOBIN A1C
HEMOGLOBIN A1C: 5.8 %{Hb} — AB (ref <5.7)
MEAN PLASMA GLUCOSE: 120 (calc)
eAG (mmol/L): 6.6 (calc)

## 2017-05-13 LAB — BASIC METABOLIC PANEL WITH GFR
BUN: 16 mg/dL (ref 7–25)
CO2: 28 mmol/L (ref 20–32)
Calcium: 9.3 mg/dL (ref 8.6–10.4)
Chloride: 106 mmol/L (ref 98–110)
Creat: 0.91 mg/dL (ref 0.50–0.99)
GFR, Est African American: 76 mL/min/{1.73_m2} (ref >=60)
GFR, Est Non African American: 66 mL/min/{1.73_m2} (ref >=60)
Glucose, Bld: 91 mg/dL (ref 65–99)
Potassium: 3.8 mmol/L (ref 3.5–5.3)
Sodium: 142 mmol/L (ref 135–146)

## 2017-05-13 LAB — CBC WITH DIFFERENTIAL/PLATELET
Basophils Absolute: 39 cells/uL (ref 0–200)
Basophils Relative: 0.9 %
Eosinophils Absolute: 142 cells/uL (ref 15–500)
Eosinophils Relative: 3.3 %
HEMATOCRIT: 39.7 % (ref 35.0–45.0)
Hemoglobin: 13.5 g/dL (ref 11.7–15.5)
Lymphs Abs: 1948 cells/uL (ref 850–3900)
MCH: 31.5 pg (ref 27.0–33.0)
MCHC: 34 g/dL (ref 32.0–36.0)
MCV: 92.5 fL (ref 80.0–100.0)
MPV: 10 fL (ref 7.5–12.5)
Monocytes Relative: 7.2 %
Neutro Abs: 1862 cells/uL (ref 1500–7800)
Neutrophils Relative %: 43.3 %
Platelets: 235 10*3/uL (ref 140–400)
RBC: 4.29 10*6/uL (ref 3.80–5.10)
RDW: 12.2 % (ref 11.0–15.0)
Total Lymphocyte: 45.3 %
WBC mixed population: 310 cells/uL (ref 200–950)
WBC: 4.3 10*3/uL (ref 3.8–10.8)

## 2017-05-13 LAB — HEPATIC FUNCTION PANEL
AG Ratio: 1.6 (calc) (ref 1.0–2.5)
ALT: 97 U/L — ABNORMAL HIGH (ref 6–29)
AST: 121 U/L — ABNORMAL HIGH (ref 10–35)
Albumin: 3.9 g/dL (ref 3.6–5.1)
Alkaline phosphatase (APISO): 67 U/L (ref 33–130)
Bilirubin, Direct: 0.1 mg/dL (ref 0.0–0.2)
Globulin: 2.5 g/dL (calc) (ref 1.9–3.7)
Indirect Bilirubin: 0.5 mg/dL (calc) (ref 0.2–1.2)
Total Bilirubin: 0.6 mg/dL (ref 0.2–1.2)
Total Protein: 6.4 g/dL (ref 6.1–8.1)

## 2017-05-13 LAB — MAGNESIUM: Magnesium: 2.1 mg/dL (ref 1.5–2.5)

## 2017-05-13 LAB — TSH: TSH: 0.2 m[IU]/L — AB (ref 0.40–4.50)

## 2017-05-13 NOTE — Progress Notes (Signed)
LVM for pt to return office call for LAB results. LAB RESULTS WERE PRINTED FOR PT TO PICK UP.

## 2017-05-14 ENCOUNTER — Other Ambulatory Visit: Payer: Self-pay

## 2017-05-14 MED ORDER — ALENDRONATE SODIUM 70 MG PO TABS: 70.0000 mg | ORAL_TABLET | ORAL | 3 refills | Status: DC

## 2017-06-07 ENCOUNTER — Other Ambulatory Visit: Payer: Self-pay | Admitting: Physician Assistant

## 2017-06-08 NOTE — Telephone Encounter (Signed)
Susan Carr has been called into pharmacy on Nov 26th By DD

## 2017-06-12 DIAGNOSIS — Z1211 Encounter for screening for malignant neoplasm of colon: Secondary | ICD-10-CM | POA: Diagnosis not present

## 2017-06-12 DIAGNOSIS — Q399 Congenital malformation of esophagus, unspecified: Secondary | ICD-10-CM | POA: Diagnosis not present

## 2017-06-12 DIAGNOSIS — K449 Diaphragmatic hernia without obstruction or gangrene: Secondary | ICD-10-CM | POA: Diagnosis not present

## 2017-06-12 DIAGNOSIS — K5730 Diverticulosis of large intestine without perforation or abscess without bleeding: Secondary | ICD-10-CM | POA: Diagnosis not present

## 2017-06-12 DIAGNOSIS — K2950 Unspecified chronic gastritis without bleeding: Secondary | ICD-10-CM | POA: Diagnosis not present

## 2017-06-12 DIAGNOSIS — R1310 Dysphagia, unspecified: Secondary | ICD-10-CM | POA: Diagnosis not present

## 2017-06-12 LAB — HM COLONOSCOPY

## 2017-07-01 DIAGNOSIS — M79644 Pain in right finger(s): Secondary | ICD-10-CM | POA: Diagnosis not present

## 2017-07-03 ENCOUNTER — Other Ambulatory Visit: Payer: Self-pay | Admitting: *Deleted

## 2017-07-03 MED ORDER — EZETIMIBE 10 MG PO TABS
10.0000 mg | ORAL_TABLET | Freq: Every day | ORAL | 0 refills | Status: DC
Start: 2017-07-03 — End: 2017-10-11

## 2017-07-08 ENCOUNTER — Ambulatory Visit (INDEPENDENT_AMBULATORY_CARE_PROVIDER_SITE_OTHER): Payer: Medicare Other | Admitting: Physician Assistant

## 2017-07-08 ENCOUNTER — Encounter: Payer: Self-pay | Admitting: Physician Assistant

## 2017-07-08 VITALS — BP 118/76 | HR 67 | Temp 97.4°F | Resp 14 | Ht 63.0 in | Wt 160.0 lb

## 2017-07-08 DIAGNOSIS — Z6828 Body mass index (BMI) 28.0-28.9, adult: Secondary | ICD-10-CM

## 2017-07-08 DIAGNOSIS — M810 Age-related osteoporosis without current pathological fracture: Secondary | ICD-10-CM | POA: Diagnosis not present

## 2017-07-08 DIAGNOSIS — K219 Gastro-esophageal reflux disease without esophagitis: Secondary | ICD-10-CM

## 2017-07-08 DIAGNOSIS — E785 Hyperlipidemia, unspecified: Secondary | ICD-10-CM

## 2017-07-08 DIAGNOSIS — G4700 Insomnia, unspecified: Secondary | ICD-10-CM | POA: Diagnosis not present

## 2017-07-08 DIAGNOSIS — Z79899 Other long term (current) drug therapy: Secondary | ICD-10-CM | POA: Diagnosis not present

## 2017-07-08 DIAGNOSIS — I10 Essential (primary) hypertension: Secondary | ICD-10-CM | POA: Diagnosis not present

## 2017-07-08 DIAGNOSIS — F411 Generalized anxiety disorder: Secondary | ICD-10-CM

## 2017-07-08 DIAGNOSIS — R7303 Prediabetes: Secondary | ICD-10-CM | POA: Diagnosis not present

## 2017-07-08 DIAGNOSIS — E559 Vitamin D deficiency, unspecified: Secondary | ICD-10-CM | POA: Diagnosis not present

## 2017-07-08 DIAGNOSIS — R7989 Other specified abnormal findings of blood chemistry: Secondary | ICD-10-CM

## 2017-07-08 DIAGNOSIS — D649 Anemia, unspecified: Secondary | ICD-10-CM

## 2017-07-08 DIAGNOSIS — R945 Abnormal results of liver function studies: Secondary | ICD-10-CM

## 2017-07-08 MED ORDER — PANTOPRAZOLE SODIUM 40 MG PO TBEC: 40.0000 mg | DELAYED_RELEASE_TABLET | Freq: Every day | ORAL | 3 refills | Status: DC

## 2017-07-08 NOTE — Progress Notes (Signed)
CPE and 3 month follow up  Assessment and Plan:  Hyperlipidemia, unspecified hyperlipidemia type -continue medications, check lipids, decrease fatty foods, increase activity.  -     Lipid panel  Prediabetes Discussed general issues about diabetes pathophysiology and management., Educational material distributed., Suggested low cholesterol diet., Encouraged aerobic exercise., Discussed foot care., Reminded to get yearly retinal exam. -     Hemoglobin A1c  Hypertension, unspecified type - continue medications, DASH diet, exercise and monitor at home. Call if greater than 130/80.  -     CBC with Differential/Platelet -     BASIC METABOLIC PANEL WITH GFR -     Hepatic function panel -     TSH  Age-related osteoporosis without current pathological fracture  off fosamax since x 5 years, will restart 2 years  Vitamin D deficiency Continue supplement  Insomnia, unspecified type -     gabapentin (NEURONTIN) 100 MG capsule; Take 1-3 capsules at bedtime. - good sleep hygiene discussed  Encounter for long-term (current) use of medications -     Magnesium  Anxiety state Continue meds  BMI 28.0-28.9,adult - follow up 3 months for progress monitoring - increase veggies, decrease carbs - long discussion about weight loss, diet, and exercise  Elevated liver function tests Check labs, avoid tylenol, alcohol, weight loss advised.  ? Need [place] -     Hepatic function panel -     Iron,Total/Total Iron Binding Cap -     Ferritin -     Hepatitis C antibody  Anemia, unspecified type -     Iron,Total/Total Iron Binding Cap -     Ferritin  Gastroesophageal reflux disease without esophagitis -     pantoprazole (PROTONIX) 40 MG tablet; Take 1 tablet (40 mg total) by mouth daily.   Discussed med's effects and SE's. Screening labs and tests as requested with regular follow-up as recommended. Over 40 minutes of exam, counseling, chart review and critical decision making was performed Future  Appointments  Date Time Provider [place]  08/19/2017  3:30 PM Unk Pinto, MD [facility] None  05/20/2018  3:00 PM Vicie Mutters, PA-C [facility] None    HPI  66 y.o. female  presents for CPE and 3 month follow up for HTN, chol, preDM, and obesity.   Her blood pressure has been controlled at home, today their BP is BP: 118/76.  She does not workout. She denies chest pain, shortness of breath, dizziness.  She does not work out secondary to time constraints as she is working two jobs and is also taking care of her demented husband.    She has had some GERD/epigastric/LUQ discomfort, she is on over the counter prilosec. She denies ETOH. She takes aleve, no tylenol use. She is not on statin. No hepatitis testing in the back. No weight loss, no night sweats.  Lab Results  Component Value Date   ALT 97 (H) 05/12/2017   AST 121 (H) 05/12/2017   ALKPHOS 52 08/26/2016   BILITOT 0.6 05/12/2017   She is not on cholesterol medication, was on zetia at one point. Her cholesterol is at goal. The cholesterol last visit was:   Lab Results  Component Value Date   CHOL 197 05/12/2017   HDL 76 05/12/2017   LDLCALC 120 (H) 08/26/2016   TRIG 96 05/12/2017   CHOLHDL 2.6 05/12/2017  .  She has been working on diet and exercise for prediabetes, she is on bASA, she is on ACE/ARB and denies foot ulcerations, hyperglycemia, hypoglycemia , increased  appetite, nausea, paresthesia of the feet, polydipsia, polyuria, visual disturbances, vomiting and weight loss. Last A1C in the office was:  Lab Results  Component Value Date   HGBA1C 5.8 (H) 05/12/2017   She has been having some upset stomach, some trouble with getting deep breath, will have acid come up with burping.  Patient is on Vitamin D supplement.   Lab Results  Component Value Date   VD25OH 69 02/20/2016     She is still having some issues with insomnia but taking 2 of the gabapentin can help, she has some feet burning that this helps.   She feels that she cannot relax because she is afraid her husband might get up and she will need to help him.   BMI is Body mass index is 28.34 kg/m., she is working on diet and exercise. Wt Readings from Last 3 Encounters:  07/08/17 160 lb (72.6 kg)  05/12/17 158 lb 6.4 oz (71.8 kg)  08/26/16 167 lb (75.8 kg)    Current Medications:  Current Outpatient Medications on File Prior to Visit  Medication Sig Dispense Refill  . acyclovir (ZOVIRAX) 200 MG capsule TAKE 2 CAPSULES BY MOUTH EVERY DAY AS DIRECTED 180 capsule 0  . aspirin EC 81 MG tablet Take 81 mg by mouth daily.    . celecoxib (CELEBREX) 100 MG capsule Take 1 capsule (100 mg total) by mouth daily as needed. 90 capsule 1  . Cholecalciferol (VITAMIN D PO) Take 5,000 Units by mouth daily.    . eszopiclone (LUNESTA) 2 MG TABS tablet TAKE 1 TABLET BY MOUTH EVERY NIGHT AT BEDTIME AS NEEDED FOR SLEEP 30 tablet 2  . ezetimibe (ZETIA) 10 MG tablet Take 1 tablet (10 mg total) by mouth daily. 90 tablet 0  . fish oil-omega-3 fatty acids 1000 MG capsule Take 1 g by mouth every morning.    . fluticasone (FLONASE) 50 MCG/ACT nasal spray Place 2 sprays into both nostrils daily. 16 g 0  . gabapentin (NEURONTIN) 100 MG capsule Take 1-3 capsules at bedtime. 240 capsule 1  . Glycerin-Polysorbate 80 (REFRESH DRY EYE THERAPY OP) Apply 1 drop to eye daily as needed (dry eyes).    Marland Kitchen ibuprofen (ADVIL,MOTRIN) 800 MG tablet Take 1 tablet (800 mg total) by mouth every 8 (eight) hours as needed. 21 tablet 0  . ranitidine (ZANTAC) 300 MG tablet Take 1 tablet (300 mg total) by mouth 2 (two) times daily. 60 tablet 1  . vitamin C (ASCORBIC ACID) 500 MG tablet Take 500 mg by mouth 2 (two) times daily.    . vitamin E 100 UNIT capsule Take 100 Units by mouth every morning.     No current facility-administered medications on file prior to visit.     Health Maintenance:   Immunization History  Administered Date(s) Administered  . Tdap 08/16/2012   Tetanus:  2014 Pneumovax: Declined Zostavax: Declined  MGM: 12/2016 DEXA: Declined cologuard 03/04/2016 +  colonoscopy Dr Watt Climes 07/04/2017 due 10 years EGD need report Last Eye Exam:  [facility] Sleep study 04/2014  Patient Care Team: Unk Pinto, MD as PCP - General (Internal Medicine) Barbaraann Cao, [place] as Referring Physician (Optometry) Clarene Essex, MD as Consulting Physician (Gastroenterology) Christophe Louis, MD as Consulting Physician (Obstetrics and Gynecology) Melrose Nakayama, MD as Consulting Physician (Orthopedic Surgery) Dorna Leitz, MD as Consulting Physician (Orthopedic Surgery)  Medical History:  Past Medical History:  Diagnosis Date  . Arthritis   . Hyperlipidemia   . Labile hypertension  Allergies Allergies  Allergen Reactions  . Cymbalta [Duloxetine Hcl]     Pt unaware of any reaction    SURGICAL HISTORY She  has a past surgical history that includes Neck surgery and Reduction mammaplasty (Bilateral, 2007). FAMILY HISTORY Her family history includes Diabetes in her mother and other; Heart attack in her father and mother; Multiple sclerosis in her other. SOCIAL HISTORY She  reports that  has never smoked. she has never used smokeless tobacco. She reports that she does not drink alcohol or use drugs.   Review of Systems: Review of Systems  Constitutional: Negative for chills, fever and malaise/fatigue.  HENT: Negative for congestion, ear pain and sore throat.   Eyes: Negative.   Respiratory: Negative for cough, shortness of breath and wheezing.   Cardiovascular: Negative for chest pain, palpitations and leg swelling.  Gastrointestinal: Negative for abdominal pain, blood in stool, constipation, diarrhea, heartburn and melena.  Genitourinary: Negative.   Skin: Negative.   Neurological: Negative for dizziness, sensory change, loss of consciousness and headaches.  Psychiatric/Behavioral: Negative for depression. The patient is not nervous/anxious and  does not have insomnia.     Physical Exam: Estimated body mass index is 28.34 kg/m as calculated from the following:   Height as of this encounter: 5\' 3"  (1.6 m).   Weight as of this encounter: 160 lb (72.6 kg). BP 118/76   Pulse 67   Temp (!) 97.4 F (36.3 C)   Resp 14   Ht 5\' 3"  (1.6 m)   Wt 160 lb (72.6 kg)   SpO2 98%   BMI 28.34 kg/m   General Appearance: Well nourished well developed, in no apparent distress.  Eyes: PERRLA, EOMs, conjunctiva no swelling or erythema ENT/Mouth: Ear canals normal without obstruction, swelling, erythema, or discharge.  TMs normal bilaterally with no erythema, bulging, retraction, or loss of landmark.  Oropharynx moist and clear with no exudate, erythema, or swelling.   Neck: Supple, thyroid normal. No bruits.  No cervical adenopathy Respiratory: Respiratory effort normal, Breath sounds clear A&P without wheeze, rhonchi, rales.   Cardio: RRR without murmurs, rubs or gallops. Brisk peripheral pulses without edema.  Chest: symmetric, with normal excursions Breasts: Scarring to bilateral breast from prior reduction, Symmetric, without lumps, nipple discharge, retractions.  Abdomen: Soft, nontender, no guarding, rebound, hernias, masses, or organomegaly.  Lymphatics: Non tender without lymphadenopathy.  Genitourinary:  Musculoskeletal: Full ROM all peripheral extremities,5/5 strength, and normal gait.  Skin: Warm, dry without rashes, lesions, ecchymosis. Neuro: Awake and oriented X 3, Cranial nerves intact, reflexes equal bilaterally. Normal muscle tone, no cerebellar symptoms. Sensation intact.  Psych:  normal affect, Insight and Judgment appropriate.   EKG WNL, no ST changes  Vicie Mutters 3:05 PM [facility]

## 2017-07-08 NOTE — Patient Instructions (Addendum)
Compression socks at [place] lower leg venous system is not the most reliable, the heart does NOT pump fluid up, there is a valve system.  The muscles of the leg squeeze and the blood moves up and a valve opens and close, then they squeeze, blood moves up and valves open and closes keeping the blood moving towards the heart.  Lots can go wrong with this valve system.  If someone is sitting or standing without movement, everyone will get swelling.  THINGS TO DO:  Do not stand or sit in one position for long periods of time. Do not sit with your legs crossed. Rest with your legs raised during the day.  Your legs have to be higher than your heart so that gravity will force the valves to open, so please really elevate your legs.   Wear elastic stockings or support hose. Do not wear other tight, encircling garments around the legs, pelvis, or waist.  [employer]  has a wide variety of well priced compression stockings. [place], [place] [address] [phone] has a good cheap selection, I like the socks, they are not as hard to get on  Walk as much as possible to increase blood flow.  Raise the foot of your bed at night with 2-inch blocks.  SEEK MEDICAL CARE IF:   The skin around your ankle starts to break down.  You have pain, redness, tenderness, or hard swelling developing in your leg over a vein.  You are uncomfortable due to leg pain.  If you ever have shortness of breath with exertion or chest pain go to the ER.   Take RX sent in for 2 weeks and add on zantac 150 mg OR pepcid 20 at night WITH the medication for 2 weeks, then you can stop or continue as needed.  Avoid alcohol, spicy foods, NSAIDS (aleve, ibuprofen) at this time. See foods below.   Food Choices for Gastroesophageal Reflux Disease When you have gastroesophageal reflux disease (GERD), the foods you eat and your eating habits are very important. Choosing the  right foods can help ease the discomfort of GERD. WHAT GENERAL GUIDELINES DO I NEED TO FOLLOW?  Choose fruits, vegetables, whole grains, low-fat dairy products, and low-fat meat, fish, and poultry.  Limit fats such as oils, salad dressings, butter, nuts, and avocado.  Keep a food diary to identify foods that cause symptoms.  Avoid foods that cause reflux. These may be different for different people.  Eat frequent small meals instead of three large meals each day.  Eat your meals slowly, in a relaxed setting.  Limit fried foods.  Cook foods using methods other than frying.  Avoid drinking alcohol.  Avoid drinking large amounts of liquids with your meals.  Avoid bending over or lying down until 2-3 hours after eating. WHAT FOODS ARE NOT RECOMMENDED? The following are some foods and drinks that may worsen your symptoms: Vegetables Tomatoes. Tomato juice. Tomato and spaghetti sauce. Chili peppers. Onion and garlic. Horseradish. Fruits Oranges, grapefruit, and lemon (fruit and juice). Meats High-fat meats, fish, and poultry. This includes hot dogs, ribs, ham, sausage, salami, and bacon. Dairy Whole milk and chocolate milk. Sour cream. Cream. Butter. Ice cream. Cream cheese.  Beverages Coffee and tea, with or without caffeine. Carbonated beverages or energy drinks. Condiments Hot sauce. Barbecue sauce.  Sweets/Desserts Chocolate and cocoa. Donuts. Peppermint and spearmint. Fats and Oils High-fat foods, including [place] fries and potato chips.  Other Vinegar. Strong spices, such as black pepper, white pepper, red pepper, cayenne, curry powder, cloves, ginger, and chili powder.

## 2017-07-09 LAB — URINALYSIS, ROUTINE W REFLEX MICROSCOPIC
Bilirubin Urine: NEGATIVE
Glucose, UA: NEGATIVE
Hgb urine dipstick: NEGATIVE
Ketones, ur: NEGATIVE
Leukocytes, UA: NEGATIVE
Nitrite: NEGATIVE
Protein, ur: NEGATIVE
Specific Gravity, Urine: 1.019 (ref 1.001–1.03)
pH: 6 (ref 5.0–8.0)

## 2017-07-09 LAB — IRON, TOTAL/TOTAL IRON BINDING CAP
%SAT: 29 % (calc) (ref 11–50)
Iron: 91 ug/dL (ref 45–160)
TIBC: 309 mcg/dL (calc) (ref 250–450)

## 2017-07-09 LAB — HEPATITIS C ANTIBODY
Hepatitis C Ab: NONREACTIVE
SIGNAL TO CUT-OFF: 0.02 (ref <1.00)

## 2017-07-09 LAB — BASIC METABOLIC PANEL WITH GFR
BUN: 18 mg/dL (ref 7–25)
CALCIUM: 9.7 mg/dL (ref 8.6–10.4)
CO2: 32 mmol/L (ref 20–32)
CREATININE: 0.79 mg/dL (ref 0.50–0.99)
Chloride: 105 mmol/L (ref 98–110)
GFR, Est African American: 90 mL/min/{1.73_m2} (ref >=60)
GFR, Est Non African American: 78 mL/min/{1.73_m2} (ref >=60)
GLUCOSE: 95 mg/dL (ref 65–99)
Potassium: 3.9 mmol/L (ref 3.5–5.3)
Sodium: 141 mmol/L (ref 135–146)

## 2017-07-09 LAB — HEPATIC FUNCTION PANEL
AG RATIO: 1.8 (calc) (ref 1.0–2.5)
ALBUMIN MSPROF: 4.4 g/dL (ref 3.6–5.1)
ALT: 13 U/L (ref 6–29)
AST: 19 U/L (ref 10–35)
Alkaline phosphatase (APISO): 50 U/L (ref 33–130)
Bilirubin, Direct: 0.1 mg/dL (ref 0.0–0.2)
GLOBULIN: 2.4 g/dL (ref 1.9–3.7)
Indirect Bilirubin: 0.5 mg/dL (calc) (ref 0.2–1.2)
TOTAL PROTEIN: 6.8 g/dL (ref 6.1–8.1)
Total Bilirubin: 0.6 mg/dL (ref 0.2–1.2)

## 2017-07-09 LAB — CBC WITH DIFFERENTIAL/PLATELET
BASOS PCT: 0.6 %
Basophils Absolute: 29 cells/uL (ref 0–200)
EOS ABS: 173 {cells}/uL (ref 15–500)
Eosinophils Relative: 3.6 %
HCT: 39.9 % (ref 35.0–45.0)
HEMOGLOBIN: 13.5 g/dL (ref 11.7–15.5)
Lymphs Abs: 1877 cells/uL (ref 850–3900)
MCH: 32 pg (ref 27.0–33.0)
MCHC: 33.8 g/dL (ref 32.0–36.0)
MCV: 94.5 fL (ref 80.0–100.0)
MONOS PCT: 6.3 %
MPV: 10.4 fL (ref 7.5–12.5)
NEUTROS ABS: 2419 {cells}/uL (ref 1500–7800)
Neutrophils Relative %: 50.4 %
Platelets: 280 10*3/uL (ref 140–400)
RBC: 4.22 10*6/uL (ref 3.80–5.10)
RDW: 11.9 % (ref 11.0–15.0)
Total Lymphocyte: 39.1 %
WBC mixed population: 302 cells/uL (ref 200–950)
WBC: 4.8 10*3/uL (ref 3.8–10.8)

## 2017-07-09 LAB — FERRITIN: Ferritin: 71 ng/mL (ref 20–288)

## 2017-07-09 LAB — MICROALBUMIN / CREATININE URINE RATIO
Creatinine, Urine: 93 mg/dL (ref 20–275)
Microalb Creat Ratio: 5 mcg/mg creat (ref <30)
Microalb, Ur: 0.5 mg/dL

## 2017-07-13 ENCOUNTER — Encounter: Payer: Self-pay | Admitting: Physician Assistant

## 2017-08-19 ENCOUNTER — Ambulatory Visit: Payer: Self-pay | Admitting: Internal Medicine

## 2017-08-26 ENCOUNTER — Other Ambulatory Visit: Payer: Self-pay | Admitting: Internal Medicine

## 2017-08-26 DIAGNOSIS — K210 Gastro-esophageal reflux disease with esophagitis: Secondary | ICD-10-CM | POA: Diagnosis not present

## 2017-08-26 DIAGNOSIS — B029 Zoster without complications: Secondary | ICD-10-CM

## 2017-09-07 ENCOUNTER — Other Ambulatory Visit: Payer: Self-pay

## 2017-09-07 ENCOUNTER — Other Ambulatory Visit: Payer: Self-pay | Admitting: Internal Medicine

## 2017-09-07 DIAGNOSIS — G4700 Insomnia, unspecified: Secondary | ICD-10-CM

## 2017-09-07 MED ORDER — ESZOPICLONE 2 MG PO TABS: ORAL_TABLET | ORAL | 1 refills | Status: DC

## 2017-09-07 MED ORDER — ESZOPICLONE 2 MG PO TABS: 2.0000 mg | ORAL_TABLET | Freq: Every evening | ORAL | 1 refills | Status: DC | PRN

## 2017-09-09 ENCOUNTER — Other Ambulatory Visit: Payer: Self-pay | Admitting: Internal Medicine
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
## 2017-10-11 ENCOUNTER — Other Ambulatory Visit: Payer: Self-pay | Admitting: Internal Medicine

## 2017-10-22 ENCOUNTER — Ambulatory Visit (INDEPENDENT_AMBULATORY_CARE_PROVIDER_SITE_OTHER): Payer: Medicare Other | Admitting: Internal Medicine

## 2017-10-22 VITALS — BP 124/72 | HR 72 | Temp 97.2°F | Resp 16 | Ht 63.0 in | Wt 160.4 lb

## 2017-10-22 DIAGNOSIS — R7303 Prediabetes: Secondary | ICD-10-CM

## 2017-10-22 DIAGNOSIS — E782 Mixed hyperlipidemia: Secondary | ICD-10-CM

## 2017-10-22 DIAGNOSIS — R0989 Other specified symptoms and signs involving the circulatory and respiratory systems: Secondary | ICD-10-CM

## 2017-10-22 DIAGNOSIS — E559 Vitamin D deficiency, unspecified: Secondary | ICD-10-CM | POA: Diagnosis not present

## 2017-10-22 DIAGNOSIS — Z79899 Other long term (current) drug therapy: Secondary | ICD-10-CM

## 2017-10-22 DIAGNOSIS — K219 Gastro-esophageal reflux disease without esophagitis: Secondary | ICD-10-CM

## 2017-10-22 DIAGNOSIS — R7309 Other abnormal glucose: Secondary | ICD-10-CM | POA: Diagnosis not present

## 2017-10-22 NOTE — Patient Instructions (Signed)

## 2017-10-22 NOTE — Progress Notes (Signed)
This very nice 67 y.o. MBF  presents for follow up with HTN, HLD, Pre-Diabetes and Vitamin D Deficiency. GERD is controlled on her meds.      Patient is followed expectantly with Labile  HTN & BP has been controlled at home. Today's BP is at goal -124/72. Patient has had no complaints of any cardiac type chest pain, palpitations, dyspnea / orthopnea / PND, dizziness, claudication, or dependent edema.     Hyperlipidemia is controlled with diet & meds. Patient denies myalgias or other med SE's. Last Lipids were near goal: Lab Results  Component Value Date   CHOL 258 (H) 10/22/2017   HDL 58 10/22/2017   LDLCALC 178 (H) 10/22/2017   TRIG 98 10/22/2017   CHOLHDL 4.4 10/22/2017      Also, the patient has history of PreDiabetes (A1c 5.9% / 2014) and has had no symptoms of reactive hypoglycemia, diabetic polys, paresthesias or visual blurring.  Last A1c was not at goal: Lab Results  Component Value Date   HGBA1C 5.9 (H) 10/22/2017      Further, the patient also has history of Vitamin D Deficiency ("25"/2008)  and supplements vitamin D without any suspected side-effects. Last vitamin D was still low (goal 70-100):  Lab Results  Component Value Date   VD25OH 48 10/22/2017   Current Outpatient Medications on File Prior to Visit  Medication Sig  . acyclovir (ZOVIRAX) 200 MG capsule TAKE 2 CAPSULES BY MOUTH EVERY DAY AS DIRECTED  . aspirin EC 81 MG tablet Take 81 mg by mouth daily.  . celecoxib (CELEBREX) 100 MG capsule Take 1 capsule (100 mg total) by mouth daily as needed.  . Cholecalciferol (VITAMIN D PO) Take 5,000 Units by mouth daily.  . eszopiclone (LUNESTA) 2 MG TABS tablet Take 1/2 to 1 tablet at hour of sleep only if needed & please try to limit to 5 days /week  . ezetimibe (ZETIA) 10 MG tablet TAKE 1 TABLET(10 MG) BY MOUTH DAILY  . fish oil-omega-3 fatty acids 1000 MG capsule Take 1 g by mouth every morning.  . fluticasone (FLONASE) 50 MCG/ACT nasal spray Place 2 sprays into both  nostrils daily.  Marland Kitchen gabapentin (NEURONTIN) 100 MG capsule Take 1-3 capsules at bedtime.  . Glycerin-Polysorbate 80 (REFRESH DRY EYE THERAPY OP) Apply 1 drop to eye daily.   Marland Kitchen ibuprofen (ADVIL,MOTRIN) 800 MG tablet Take 1 tablet (800 mg total) by mouth every 8 (eight) hours as needed.  . pantoprazole (PROTONIX) 40 MG tablet Take 1 tablet (40 mg total) by mouth daily.  . vitamin C (ASCORBIC ACID) 500 MG tablet Take 500 mg by mouth 2 (two) times daily.  . vitamin E 100 UNIT capsule Take 100 Units by mouth every morning.  . ranitidine (ZANTAC) 300 MG tablet Take 1 tablet (300 mg total) by mouth 2 (two) times daily.   No current facility-administered medications on file prior to visit.    No Known Allergies PMHx:   Past Medical History:  Diagnosis Date  . Arthritis   . Hyperlipidemia   . Labile hypertension    Immunization History  Administered Date(s) Administered  . Tdap 08/16/2012   Past Surgical History:  Procedure Laterality Date  . NECK SURGERY    . REDUCTION MAMMAPLASTY Bilateral 2007   FHx:    Reviewed / unchanged  SHx:    Reviewed / unchanged   Systems Review:  Constitutional: Denies fever, chills, wt changes, headaches, insomnia, fatigue, night sweats, change in appetite. Eyes: Denies  redness, blurred vision, diplopia, discharge, itchy, watery eyes.  ENT: Denies discharge, congestion, post nasal drip, epistaxis, sore throat, earache, hearing loss, dental pain, tinnitus, vertigo, sinus pain, snoring.  CV: Denies chest pain, palpitations, irregular heartbeat, syncope, dyspnea, diaphoresis, orthopnea, PND, claudication or edema. Respiratory: denies cough, dyspnea, DOE, pleurisy, hoarseness, laryngitis, wheezing.  Gastrointestinal: Denies dysphagia, odynophagia, heartburn, reflux, water brash, abdominal pain or cramps, nausea, vomiting, bloating, diarrhea, constipation, hematemesis, melena, hematochezia  or hemorrhoids. Genitourinary: Denies dysuria, frequency, urgency,  nocturia, hesitancy, discharge, hematuria or flank pain. Musculoskeletal: Denies arthralgias, myalgias, stiffness, jt. swelling, pain, limping or strain/sprain.  Skin: Denies pruritus, rash, hives, warts, acne, eczema or change in skin lesion(s). Neuro: No weakness, tremor, incoordination, spasms, paresthesia or pain. Psychiatric: Denies confusion, memory loss or sensory loss. Endo: Denies change in weight, skin or hair change.  Heme/Lymph: No excessive bleeding, bruising or enlarged lymph nodes.  Physical Exam  BP 124/72   Pulse 72   Temp (!) 97.2 F (36.2 C)   Resp 16   Ht 5\' 3"  (1.6 m)   Wt 160 lb 6.4 oz (72.8 kg)   BMI 28.41 kg/m   Appears  well nourished, well groomed  and in no distress.  Eyes: PERRLA, EOMs, conjunctiva no swelling or erythema. Sinuses: No frontal/maxillary tenderness ENT/Mouth: EAC's clear, TM's nl w/o erythema, bulging. Nares clear w/o erythema, swelling, exudates. Oropharynx clear without erythema or exudates. Oral hygiene is good. Tongue normal, non obstructing. Hearing intact.  Neck: Supple. Thyroid not palpable. Car 2+/2+ without bruits, nodes or JVD. Chest: Respirations nl with BS clear & equal w/o rales, rhonchi, wheezing or stridor.  Cor: Heart sounds normal w/ regular rate and rhythm without sig. murmurs, gallops, clicks or rubs. Peripheral pulses normal and equal  without edema.  Abdomen: Soft & bowel sounds normal. Non-tender w/o guarding, rebound, hernias, masses or organomegaly.  Lymphatics: Unremarkable.  Musculoskeletal: Full ROM all peripheral extremities, joint stability, 5/5 strength and normal gait.  Skin: Warm, dry without exposed rashes, lesions or ecchymosis apparent.  Neuro: Cranial nerves intact, reflexes equal bilaterally. Sensory-motor testing grossly intact. Tendon reflexes grossly intact.  Pysch: Alert & oriented x 3.  Insight and judgement nl & appropriate. No ideations.  Assessment and Plan:  1. Labile hypertension  -  Continue medication, monitor blood pressure at home.  - Continue DASH diet.  Reminder to go to the ER if any CP,  SOB, nausea, dizziness, severe HA, changes vision/speech.  - CBC with Differential/Platelet - BASIC METABOLIC PANEL WITH GFR - Magnesium - TSH  2. Hyperlipidemia, mixed  - Continue diet/meds, exercise,& lifestyle modifications.  - Continue monitor periodic cholesterol/liver & renal functions   - Hepatic function panel - Lipid panel - TSH  3. Abnormal glucose  - Continue diet, exercise, lifestyle modifications.  - Monitor appropriate labs.  - Hemoglobin A1c - Insulin, random  4. Vitamin D deficiency  - Continue supplementation.  - VITAMIN D 25 Hydroxyl  5. Prediabetes  - Hemoglobin A1c - Insulin, random  6. Gastroesophageal reflux disease  - CBC with Differential/Platelet  7. Medication management  - CBC with Differential/Platelet - BASIC METABOLIC PANEL WITH GFR - Hepatic function panel - Magnesium - Lipid panel - TSH - Hemoglobin A1c - Insulin, random - VITAMIN D 25 Hydroxyl           Discussed  regular exercise, BP monitoring, weight control to achieve/maintain BMI less than 25 and discussed med and SE's. Recommended labs to assess and monitor clinical status with further disposition  pending results of labs. Over 30 minutes of exam, counseling, chart review was performed.

## 2017-10-23 ENCOUNTER — Other Ambulatory Visit: Payer: Self-pay | Admitting: Internal Medicine

## 2017-10-23 DIAGNOSIS — E782 Mixed hyperlipidemia: Secondary | ICD-10-CM

## 2017-10-23 LAB — BASIC METABOLIC PANEL WITH GFR
BUN: 17 mg/dL (ref 7–25)
CO2: 32 mmol/L (ref 20–32)
Calcium: 9.5 mg/dL (ref 8.6–10.4)
Chloride: 104 mmol/L (ref 98–110)
Creat: 0.92 mg/dL (ref 0.50–0.99)
GFR, Est African American: 75 mL/min/{1.73_m2} (ref >=60)
GFR, Est Non African American: 64 mL/min/{1.73_m2} (ref >=60)
Glucose, Bld: 127 mg/dL — ABNORMAL HIGH (ref 65–99)
Potassium: 3.9 mmol/L (ref 3.5–5.3)
Sodium: 141 mmol/L (ref 135–146)

## 2017-10-23 LAB — HEPATIC FUNCTION PANEL
AG Ratio: 1.7 (calc) (ref 1.0–2.5)
ALT: 14 U/L (ref 6–29)
AST: 19 U/L (ref 10–35)
Albumin: 4.2 g/dL (ref 3.6–5.1)
Alkaline phosphatase (APISO): 61 U/L (ref 33–130)
Bilirubin, Direct: 0.1 mg/dL (ref 0.0–0.2)
Globulin: 2.5 g/dL (calc) (ref 1.9–3.7)
Indirect Bilirubin: 0.6 mg/dL (calc) (ref 0.2–1.2)
Total Bilirubin: 0.7 mg/dL (ref 0.2–1.2)
Total Protein: 6.7 g/dL (ref 6.1–8.1)

## 2017-10-23 LAB — LIPID PANEL
Cholesterol: 258 mg/dL — ABNORMAL HIGH (ref <200)
HDL: 58 mg/dL (ref >50)
LDL Cholesterol (Calc): 178 mg/dL (calc) — ABNORMAL HIGH
Non-HDL Cholesterol (Calc): 200 mg/dL (calc) — ABNORMAL HIGH (ref <130)
Total CHOL/HDL Ratio: 4.4 (calc) (ref <5.0)
Triglycerides: 98 mg/dL (ref <150)

## 2017-10-23 LAB — TSH: TSH: 0.27 mIU/L — ABNORMAL LOW (ref 0.40–4.50)

## 2017-10-23 LAB — MAGNESIUM: Magnesium: 2.1 mg/dL (ref 1.5–2.5)

## 2017-10-23 LAB — CBC WITH DIFFERENTIAL/PLATELET
BASOS PCT: 0.6 %
Basophils Absolute: 30 cells/uL (ref 0–200)
EOS ABS: 140 {cells}/uL (ref 15–500)
Eosinophils Relative: 2.8 %
HEMATOCRIT: 38.5 % (ref 35.0–45.0)
HEMOGLOBIN: 13.3 g/dL (ref 11.7–15.5)
LYMPHS ABS: 1725 {cells}/uL (ref 850–3900)
MCH: 31.6 pg (ref 27.0–33.0)
MCHC: 34.5 g/dL (ref 32.0–36.0)
MCV: 91.4 fL (ref 80.0–100.0)
MPV: 10 fL (ref 7.5–12.5)
Monocytes Relative: 6.6 %
NEUTROS ABS: 2775 {cells}/uL (ref 1500–7800)
Neutrophils Relative %: 55.5 %
Platelets: 267 10*3/uL (ref 140–400)
RBC: 4.21 10*6/uL (ref 3.80–5.10)
RDW: 12 % (ref 11.0–15.0)
Total Lymphocyte: 34.5 %
WBC: 5 10*3/uL (ref 3.8–10.8)
WBCMIX: 330 {cells}/uL (ref 200–950)

## 2017-10-23 LAB — INSULIN, RANDOM: Insulin: 59.7 u[IU]/mL — ABNORMAL HIGH (ref 2.0–19.6)

## 2017-10-23 LAB — HEMOGLOBIN A1C
Hgb A1c MFr Bld: 5.9 % of total Hgb — ABNORMAL HIGH (ref <5.7)
Mean Plasma Glucose: 123 (calc)
eAG (mmol/L): 6.8 (calc)

## 2017-10-23 LAB — VITAMIN D 25 HYDROXY (VIT D DEFICIENCY, FRACTURES): Vit D, 25-Hydroxy: 48 ng/mL (ref 30–100)

## 2017-10-23 MED ORDER — ROSUVASTATIN CALCIUM 40 MG PO TABS: ORAL_TABLET | ORAL | 1 refills | Status: DC

## 2017-10-24 ENCOUNTER — Encounter: Payer: Self-pay | Admitting: Internal Medicine

## 2017-10-26 ENCOUNTER — Encounter: Payer: Self-pay | Admitting: *Deleted

## 2017-11-10 ENCOUNTER — Other Ambulatory Visit: Payer: Self-pay | Admitting: Internal Medicine

## 2017-11-10 DIAGNOSIS — Z1231 Encounter for screening mammogram for malignant neoplasm of breast: Secondary | ICD-10-CM

## 2017-11-27 ENCOUNTER — Other Ambulatory Visit: Payer: Self-pay | Admitting: Internal Medicine

## 2017-11-27 MED ORDER — RANITIDINE HCL 300 MG PO TABS: ORAL_TABLET | ORAL | 3 refills | Status: DC

## 2017-12-01 ENCOUNTER — Other Ambulatory Visit: Payer: Self-pay | Admitting: *Deleted

## 2017-12-01 MED ORDER — RANITIDINE HCL 300 MG PO TABS
ORAL_TABLET | ORAL | 3 refills | Status: DC
Start: 2017-12-01 — End: 2019-03-22

## 2017-12-22 ENCOUNTER — Ambulatory Visit: Payer: Medicare Other

## 2017-12-24 ENCOUNTER — Ambulatory Visit
Admission: RE | Admit: 2017-12-24 | Discharge: 2017-12-24 | Disposition: A | Discharge status: Home / Self Care | Payer: Medicare Other | Source: Ambulatory Visit | Attending: Internal Medicine | Admitting: Internal Medicine

## 2017-12-24 DIAGNOSIS — Z1231 Encounter for screening mammogram for malignant neoplasm of breast: Secondary | ICD-10-CM

## 2017-12-28 ENCOUNTER — Other Ambulatory Visit: Payer: Self-pay | Admitting: Physician Assistant

## 2017-12-28 DIAGNOSIS — G4700 Insomnia, unspecified: Secondary | ICD-10-CM

## 2018-02-01 ENCOUNTER — Other Ambulatory Visit: Payer: Self-pay | Admitting: Internal Medicine

## 2018-02-01 DIAGNOSIS — G4700 Insomnia, unspecified: Secondary | ICD-10-CM

## 2018-02-11 ENCOUNTER — Ambulatory Visit: Payer: Self-pay | Admitting: Adult Health

## 2018-02-24 ENCOUNTER — Other Ambulatory Visit: Payer: Self-pay | Admitting: Physician Assistant

## 2018-02-24 ENCOUNTER — Other Ambulatory Visit: Payer: Self-pay | Admitting: Internal Medicine

## 2018-02-24 DIAGNOSIS — B029 Zoster without complications: Secondary | ICD-10-CM

## 2018-03-02 ENCOUNTER — Ambulatory Visit: Payer: Self-pay | Admitting: Adult Health

## 2018-03-04 ENCOUNTER — Other Ambulatory Visit: Payer: Self-pay | Admitting: Adult Health

## 2018-03-04 ENCOUNTER — Ambulatory Visit: Payer: Self-pay | Admitting: Adult Health

## 2018-03-04 DIAGNOSIS — K219 Gastro-esophageal reflux disease without esophagitis: Secondary | ICD-10-CM | POA: Insufficient documentation

## 2018-03-04 DIAGNOSIS — G4700 Insomnia, unspecified: Secondary | ICD-10-CM

## 2018-03-04 DIAGNOSIS — E663 Overweight: Secondary | ICD-10-CM | POA: Insufficient documentation

## 2018-03-04 NOTE — Progress Notes (Deleted)
FOLLOW UP  Assessment and Plan:   Hypertension Well controlled with current medications  Monitor blood pressure at home; patient to call if consistently greater than 130/80 Continue DASH diet.   Reminder to go to the ER if any CP, SOB, nausea, dizziness, severe HA, changes vision/speech, left arm numbness and tingling and jaw pain.  Cholesterol Currently above goal; currently taking *** Continue low cholesterol diet and exercise.  Check lipid panel.   Prediabetes Continue diet and exercise.  Perform daily foot/skin check, notify office of any concerning changes.  Check A1C  Overweight Long discussion about weight loss, diet, and exercise Recommended diet heavy in fruits and veggies and low in animal meats, cheeses, and dairy products, appropriate calorie intake Discussed ideal weight for height and initial weight goal (***) Patient will work on *** Will follow up in 3 months  Vitamin D Def Below goal at last visit; continue supplementation for goal of 70-100 She has *** increased supplement Defer Vit D level  Continue diet and meds as discussed. Further disposition pending results of labs. Discussed med's effects and SE's.   Over 30 minutes of exam, counseling, chart review, and critical decision making was performed.   Future Appointments  Date Time Provider [place]  03/08/2018  4:00 PM Liane Comber, NP [facility] None  05/20/2018  3:00 PM Vicie Mutters, PA-C [facility] None    ----------------------------------------------------------------------------------------------------------------------  HPI 67 y.o. female  presents for 3 month follow up on hypertension, cholesterol, prediabetes, weight and vitamin D deficiency.   BMI is There is no height or weight on file to calculate BMI., she {HAS HAS NLG:92119} been working on diet and exercise. Wt Readings from Last 3 Encounters:  10/22/17 160 lb 6.4 oz (72.8 kg)  07/08/17 160 lb (72.6 kg)  05/12/17 158  lb 6.4 oz (71.8 kg)   Her blood pressure {HAS HAS NOT:18834} been controlled at home, today their BP is    She {DOES_DOES ERD:40814} workout. She denies chest pain, shortness of breath, dizziness.   She is on cholesterol medication (rosuvastatin ***, zetia ***) and denies myalgias. Her cholesterol is not at goal. The cholesterol last visit was:   Lab Results  Component Value Date   CHOL 258 (H) 10/22/2017   HDL 58 10/22/2017   LDLCALC 178 (H) 10/22/2017   TRIG 98 10/22/2017   CHOLHDL 4.4 10/22/2017    She {Has/has not:18111} been working on diet and exercise for prediabetes, and denies {Symptoms; diabetes w/o none:19199}. Last A1C in the office was:  Lab Results  Component Value Date   HGBA1C 5.9 (H) 10/22/2017   Patient is on Vitamin D supplement.   Lab Results  Component Value Date   VD25OH 48 10/22/2017        Current Medications:  Current Outpatient Medications on File Prior to Visit  Medication Sig  . acyclovir (ZOVIRAX) 200 MG capsule TAKE 2 CAPSULES BY MOUTH EVERY DAY AS DIRECTED  . aspirin EC 81 MG tablet Take 81 mg by mouth daily.  . celecoxib (CELEBREX) 100 MG capsule Take 1 capsule (100 mg total) by mouth daily as needed.  . Cholecalciferol (VITAMIN D PO) Take 5,000 Units by mouth daily.  . eszopiclone (LUNESTA) 2 MG TABS tablet TAKE 1 TABLET(2 MG) BY MOUTH AT BEDTIME AS NEEDED FOR SLEEP  . ezetimibe (ZETIA) 10 MG tablet TAKE 1 TABLET(10 MG) BY MOUTH DAILY  . fish oil-omega-3 fatty acids 1000 MG capsule Take 1 g by mouth every morning.  . fluticasone (FLONASE) 50 MCG/ACT  nasal spray Place 2 sprays into both nostrils daily.  Marland Kitchen gabapentin (NEURONTIN) 100 MG capsule TAKE 1 TO 3 CAPSULES BY MOUTH AT BEDTIME  . Glycerin-Polysorbate 80 (REFRESH DRY EYE THERAPY OP) Apply 1 drop to eye daily.   Marland Kitchen ibuprofen (ADVIL,MOTRIN) 800 MG tablet Take 1 tablet (800 mg total) by mouth every 8 (eight) hours as needed.  . pantoprazole (PROTONIX) 40 MG tablet Take 1 tablet (40 mg total)  by mouth daily.  . ranitidine (ZANTAC) 300 MG tablet Take 1 tablet 2 x / day as needed for indigestion & heartburn  . rosuvastatin (CRESTOR) 40 MG tablet Take 1/2 to 1 tablet daily or as directed for Cholesterol  . vitamin C (ASCORBIC ACID) 500 MG tablet Take 500 mg by mouth 2 (two) times daily.  . vitamin E 100 UNIT capsule Take 100 Units by mouth every morning.   No current facility-administered medications on file prior to visit.      Allergies: No Known Allergies   Medical History:  Past Medical History:  Diagnosis Date  . Arthritis   . Hyperlipidemia   . Labile hypertension    Family history- Reviewed and unchanged Social history- Reviewed and unchanged   Review of Systems:  Review of Systems  Constitutional: Negative for malaise/fatigue and weight loss.  HENT: Negative for hearing loss and tinnitus.   Eyes: Negative for blurred vision and double vision.  Respiratory: Negative for cough, shortness of breath and wheezing.   Cardiovascular: Negative for chest pain, palpitations, orthopnea, claudication and leg swelling.  Gastrointestinal: Negative for abdominal pain, blood in stool, constipation, diarrhea, heartburn, melena, nausea and vomiting.  Genitourinary: Negative.   Musculoskeletal: Negative for joint pain and myalgias.  Skin: Negative for rash.  Neurological: Negative for dizziness, tingling, sensory change, weakness and headaches.  Endo/Heme/Allergies: Negative for polydipsia.  Psychiatric/Behavioral: The patient has insomnia.   All other systems reviewed and are negative.     Physical Exam: There were no vitals taken for this visit. Wt Readings from Last 3 Encounters:  10/22/17 160 lb 6.4 oz (72.8 kg)  07/08/17 160 lb (72.6 kg)  05/12/17 158 lb 6.4 oz (71.8 kg)   General Appearance: Well nourished, in no apparent distress. Eyes: PERRLA, EOMs, conjunctiva no swelling or erythema Sinuses: No Frontal/maxillary tenderness ENT/Mouth: Ext aud canals clear,  TMs without erythema, bulging. No erythema, swelling, or exudate on post pharynx.  Tonsils not swollen or erythematous. Hearing normal.  Neck: Supple, thyroid normal.  Respiratory: Respiratory effort normal, BS equal bilaterally without rales, rhonchi, wheezing or stridor.  Cardio: RRR with no MRGs. Brisk peripheral pulses without edema.  Abdomen: Soft, + BS.  Non tender, no guarding, rebound, hernias, masses. Lymphatics: Non tender without lymphadenopathy.  Musculoskeletal: Full ROM, 5/5 strength, {PSY - GAIT AND STATION:22860} gait Skin: Warm, dry without rashes, lesions, ecchymosis.  Neuro: Cranial nerves intact. No cerebellar symptoms.  Psych: Awake and oriented X 3, normal affect, Insight and Judgment appropriate.    Izora Ribas, NP 8:45 AM [facility]

## 2018-03-08 ENCOUNTER — Ambulatory Visit: Payer: Self-pay | Admitting: Adult Health

## 2018-03-16 NOTE — Progress Notes (Deleted)
FOLLOW UP  Assessment and Plan:   Hypertension Well controlled with current medications  Monitor blood pressure at home; patient to call if consistently greater than 130/80 Continue DASH diet.   Reminder to go to the ER if any CP, SOB, nausea, dizziness, severe HA, changes vision/speech, left arm numbness and tingling and jaw pain.  Cholesterol Currently above goal; currently taking *** Continue low cholesterol diet and exercise.  Check lipid panel.   Prediabetes Continue diet and exercise.  Perform daily foot/skin check, notify office of any concerning changes.  Check A1C  Overweight Long discussion about weight loss, diet, and exercise Recommended diet heavy in fruits and veggies and low in animal meats, cheeses, and dairy products, appropriate calorie intake Discussed ideal weight for height and initial weight goal (***) Patient will work on *** Will follow up in 3 months  Vitamin D Def Below goal at last visit; continue supplementation for goal of 70-100 She has *** increased supplement Defer Vit D level  Insomnia - good sleep hygiene discussed, increase day time activity Continue medications   Continue diet and meds as discussed. Further disposition pending results of labs. Discussed med's effects and SE's.   Over 30 minutes of exam, counseling, chart review, and critical decision making was performed.   Future Appointments  Date Time Provider Spencer  03/17/2018  4:15 PM Liane Comber, NP [facility] None  05/20/2018  3:00 PM Vicie Mutters, PA-C [facility] None    ----------------------------------------------------------------------------------------------------------------------  HPI 67 y.o. female  presents for 3 month follow up on hypertension, cholesterol, prediabetes, weight and vitamin D deficiency.   She is prescribed lunestra and gabapentin for insomnia ***  BMI is There is no height or weight on file to calculate BMI., she {HAS HAS  KXF:81829} been working on diet and exercise. Wt Readings from Last 3 Encounters:  10/22/17 160 lb 6.4 oz (72.8 kg)  07/08/17 160 lb (72.6 kg)  05/12/17 158 lb 6.4 oz (71.8 kg)   Her blood pressure {HAS HAS NOT:18834} been controlled at home, today their BP is    She {DOES_DOES HBZ:16967} workout. She denies chest pain, shortness of breath, dizziness.   She is on cholesterol medication (rosuvastatin ***, zetia ***) and denies myalgias. Her cholesterol is not at goal. The cholesterol last visit was:   Lab Results  Component Value Date   CHOL 258 (H) 10/22/2017   HDL 58 10/22/2017   LDLCALC 178 (H) 10/22/2017   TRIG 98 10/22/2017   CHOLHDL 4.4 10/22/2017    She {Has/has not:18111} been working on diet and exercise for prediabetes, and denies {Symptoms; diabetes w/o none:19199}. Last A1C in the office was:  Lab Results  Component Value Date   HGBA1C 5.9 (H) 10/22/2017   Patient is on Vitamin D supplement.   Lab Results  Component Value Date   VD25OH 48 10/22/2017        Current Medications:  Current Outpatient Medications on File Prior to Visit  Medication Sig  . acyclovir (ZOVIRAX) 200 MG capsule TAKE 2 CAPSULES BY MOUTH EVERY DAY AS DIRECTED  . aspirin EC 81 MG tablet Take 81 mg by mouth daily.  . celecoxib (CELEBREX) 100 MG capsule Take 1 capsule (100 mg total) by mouth daily as needed.  . Cholecalciferol (VITAMIN D PO) Take 5,000 Units by mouth daily.  . eszopiclone (LUNESTA) 2 MG TABS tablet TAKE 1 TABLET(2 MG) BY MOUTH AT BEDTIME AS NEEDED FOR SLEEP  . ezetimibe (ZETIA) 10 MG tablet TAKE 1 TABLET(10 MG) BY  MOUTH DAILY  . fish oil-omega-3 fatty acids 1000 MG capsule Take 1 g by mouth every morning.  . fluticasone (FLONASE) 50 MCG/ACT nasal spray Place 2 sprays into both nostrils daily.  Marland Kitchen gabapentin (NEURONTIN) 100 MG capsule TAKE 1 TO 3 CAPSULES BY MOUTH AT BEDTIME  . Glycerin-Polysorbate 80 (REFRESH DRY EYE THERAPY OP) Apply 1 drop to eye daily.   Marland Kitchen ibuprofen  (ADVIL,MOTRIN) 800 MG tablet Take 1 tablet (800 mg total) by mouth every 8 (eight) hours as needed.  . pantoprazole (PROTONIX) 40 MG tablet Take 1 tablet (40 mg total) by mouth daily.  . ranitidine (ZANTAC) 300 MG tablet Take 1 tablet 2 x / day as needed for indigestion & heartburn  . rosuvastatin (CRESTOR) 40 MG tablet Take 1/2 to 1 tablet daily or as directed for Cholesterol  . vitamin C (ASCORBIC ACID) 500 MG tablet Take 500 mg by mouth 2 (two) times daily.  . vitamin E 100 UNIT capsule Take 100 Units by mouth every morning.   No current facility-administered medications on file prior to visit.      Allergies: No Known Allergies   Medical History:  Past Medical History:  Diagnosis Date  . Arthritis   . Hyperlipidemia   . Labile hypertension    Family history- Reviewed and unchanged Social history- Reviewed and unchanged   Review of Systems:  Review of Systems  Constitutional: Negative for malaise/fatigue and weight loss.  HENT: Negative for hearing loss and tinnitus.   Eyes: Negative for blurred vision and double vision.  Respiratory: Negative for cough, shortness of breath and wheezing.   Cardiovascular: Negative for chest pain, palpitations, orthopnea, claudication and leg swelling.  Gastrointestinal: Negative for abdominal pain, blood in stool, constipation, diarrhea, heartburn, melena, nausea and vomiting.  Genitourinary: Negative.   Musculoskeletal: Negative for joint pain and myalgias.  Skin: Negative for rash.  Neurological: Negative for dizziness, tingling, sensory change, weakness and headaches.  Endo/Heme/Allergies: Negative for polydipsia.  Psychiatric/Behavioral: The patient has insomnia.   All other systems reviewed and are negative.     Physical Exam: There were no vitals taken for this visit. Wt Readings from Last 3 Encounters:  10/22/17 160 lb 6.4 oz (72.8 kg)  07/08/17 160 lb (72.6 kg)  05/12/17 158 lb 6.4 oz (71.8 kg)   General Appearance: Well  nourished, in no apparent distress. Eyes: PERRLA, EOMs, conjunctiva no swelling or erythema Sinuses: No Frontal/maxillary tenderness ENT/Mouth: Ext aud canals clear, TMs without erythema, bulging. No erythema, swelling, or exudate on post pharynx.  Tonsils not swollen or erythematous. Hearing normal.  Neck: Supple, thyroid normal.  Respiratory: Respiratory effort normal, BS equal bilaterally without rales, rhonchi, wheezing or stridor.  Cardio: RRR with no MRGs. Brisk peripheral pulses without edema.  Abdomen: Soft, + BS.  Non tender, no guarding, rebound, hernias, masses. Lymphatics: Non tender without lymphadenopathy.  Musculoskeletal: Full ROM, 5/5 strength, {PSY - GAIT AND STATION:22860} gait Skin: Warm, dry without rashes, lesions, ecchymosis.  Neuro: Cranial nerves intact. No cerebellar symptoms.  Psych: Awake and oriented X 3, normal affect, Insight and Judgment appropriate.    Izora Ribas, NP 9:32 AM [facility]

## 2018-03-17 ENCOUNTER — Ambulatory Visit: Payer: Self-pay | Admitting: Adult Health

## 2018-03-17 NOTE — Progress Notes (Signed)
FOLLOW UP  Assessment and Plan:   Hypertension Well controlled with current medications  Monitor blood pressure at home; patient to call if consistently greater than 130/80 Continue DASH diet.   Reminder to go to the ER if any CP, SOB, nausea, dizziness, severe HA, changes vision/speech, left arm numbness and tingling and jaw pain.  Cholesterol Currently above goal; currently taking zetia 10 mg only, has been prescribed crestor but didn't start, very resistant to taking, patient would like to work hard on lifestyle, discussed goal <130 minimum Continue low cholesterol diet and exercise.  Check lipid panel.   Prediabetes Continue diet and exercise.  Perform daily foot/skin check, notify office of any concerning changes.  Check A1C  Overweight Long discussion about weight loss, diet, and exercise Recommended diet heavy in fruits and veggies and low in animal meats, cheeses, and dairy products, appropriate calorie intake Discussed ideal weight for height  Will follow up in 3 months  Vitamin D Def Below goal at last visit; continue supplementation for goal of 70-100 She has  increased supplement Defer Vit D level  Insomnia - good sleep hygiene discussed, increase day time activity Continue medications, increase gabapentin to 3-400 mg nightly to see if this improves   R hip pain Intermittent, unclear etiology from exam today patient declines imaging/referral Discussed NSAIDs, will try short course of prednisone and patient will follow up if not improving  Patient requests to defer labs until early next week; lab visit will be scheduled  Continue diet and meds as discussed. Further disposition pending results of labs. Discussed med's effects and SE's.   Over 30 minutes of exam, counseling, chart review, and critical decision making was performed.   Future Appointments  Date Time Provider Lydia  03/22/2018 10:00 AM [facility] None  05/20/2018  3:00  PM Vicie Mutters, PA-C [facility] None    ----------------------------------------------------------------------------------------------------------------------  HPI 67 y.o. female  presents for 3 month follow up on hypertension, cholesterol, prediabetes, weight and vitamin D deficiency.   She is prescribed lunestra and gabapentin for insomnia, currently taking 200 mg of gabapentin, hasn't tried higher dose yet. Recommended she increase as prescribed.   She reports intermittent R hip "twinging" deep pain with movement or weight bearing. Take ibuprofen intermittently. Cannot clarify length of pain, denies falls or injury. Declines imaging at this time.   BMI is Body mass index is 28.17 kg/m., she has not been working on diet and exercise. Wt Readings from Last 3 Encounters:  03/18/18 159 lb (72.1 kg)  10/22/17 160 lb 6.4 oz (72.8 kg)  07/08/17 160 lb (72.6 kg)   Her blood pressure has been controlled at home, today their BP is BP: 124/74  She does not workout. She denies chest pain, shortness of breath, dizziness.   She is on cholesterol medication (rosuvastatin was prescribed but never started, very resistent, is taking zetia 10) and denies myalgias. Her cholesterol is not at goal. The cholesterol last visit was:   Lab Results  Component Value Date   CHOL 258 (H) 10/22/2017   HDL 58 10/22/2017   LDLCALC 178 (H) 10/22/2017   TRIG 98 10/22/2017   CHOLHDL 4.4 10/22/2017    She has been working on diet and exercise for prediabetes, and denies foot ulcerations, increased appetite, nausea, paresthesia of the feet, polydipsia, polyuria, visual disturbances, vomiting and weight loss. Last A1C in the office was:  Lab Results  Component Value Date   HGBA1C 5.9 (H) 10/22/2017   Patient is on  Vitamin D supplement.   Lab Results  Component Value Date   VD25OH 48 10/22/2017        Current Medications:  Current Outpatient Medications on File Prior to Visit  Medication Sig  .  acyclovir (ZOVIRAX) 200 MG capsule TAKE 2 CAPSULES BY MOUTH EVERY DAY AS DIRECTED (Patient taking differently: as needed. )  . aspirin EC 81 MG tablet Take 81 mg by mouth daily.  . celecoxib (CELEBREX) 100 MG capsule Take 1 capsule (100 mg total) by mouth daily as needed.  . Cholecalciferol (VITAMIN D PO) Take 5,000 Units by mouth daily.  . eszopiclone (LUNESTA) 2 MG TABS tablet TAKE 1 TABLET(2 MG) BY MOUTH AT BEDTIME AS NEEDED FOR SLEEP  . ezetimibe (ZETIA) 10 MG tablet TAKE 1 TABLET(10 MG) BY MOUTH DAILY  . fish oil-omega-3 fatty acids 1000 MG capsule Take 1 g by mouth every morning.  . fluticasone (FLONASE) 50 MCG/ACT nasal spray Place 2 sprays into both nostrils daily.  Marland Kitchen gabapentin (NEURONTIN) 100 MG capsule TAKE 1 TO 3 CAPSULES BY MOUTH AT BEDTIME  . Glycerin-Polysorbate 80 (REFRESH DRY EYE THERAPY OP) Apply 1 drop to eye daily.   Marland Kitchen ibuprofen (ADVIL,MOTRIN) 800 MG tablet Take 1 tablet (800 mg total) by mouth every 8 (eight) hours as needed.  . pantoprazole (PROTONIX) 40 MG tablet Take 1 tablet (40 mg total) by mouth daily.  . vitamin C (ASCORBIC ACID) 500 MG tablet Take 500 mg by mouth 2 (two) times daily.  . vitamin E 100 UNIT capsule Take 100 Units by mouth every morning.  . ranitidine (ZANTAC) 300 MG tablet Take 1 tablet 2 x / day as needed for indigestion & heartburn (Patient not taking: Reported on 03/18/2018)  . rosuvastatin (CRESTOR) 40 MG tablet Take 1/2 to 1 tablet daily or as directed for Cholesterol (Patient not taking: Reported on 03/18/2018)   No current facility-administered medications on file prior to visit.      Allergies: No Known Allergies   Medical History:  Past Medical History:  Diagnosis Date  . Arthritis   . Hyperlipidemia   . Labile hypertension    Family history- Reviewed and unchanged Social history- Reviewed and unchanged   Review of Systems:  Review of Systems  Constitutional: Negative for malaise/fatigue and weight loss.  HENT: Negative for hearing  loss and tinnitus.   Eyes: Negative for blurred vision and double vision.  Respiratory: Negative for cough, shortness of breath and wheezing.   Cardiovascular: Negative for chest pain, palpitations, orthopnea, claudication and leg swelling.  Gastrointestinal: Negative for abdominal pain, blood in stool, constipation, diarrhea, heartburn, melena, nausea and vomiting.  Genitourinary: Negative.   Musculoskeletal: Positive for joint pain (R hip pain, intermittent "twinges" ). Negative for myalgias.  Skin: Negative for rash.  Neurological: Negative for dizziness, tingling, sensory change, weakness and headaches.  Endo/Heme/Allergies: Negative for polydipsia.  Psychiatric/Behavioral: The patient has insomnia.   All other systems reviewed and are negative.   Physical Exam: BP 124/74   Pulse 63   Temp (!) 97.3 F (36.3 C)   Ht 5\' 3"  (1.6 m)   Wt 159 lb (72.1 kg)   SpO2 99%   BMI 28.17 kg/m  Wt Readings from Last 3 Encounters:  03/18/18 159 lb (72.1 kg)  10/22/17 160 lb 6.4 oz (72.8 kg)  07/08/17 160 lb (72.6 kg)   General Appearance: Well nourished, in no apparent distress. Eyes: PERRLA, EOMs, conjunctiva no swelling or erythema Sinuses: No Frontal/maxillary tenderness ENT/Mouth: Ext aud canals clear,  TMs without erythema, bulging. No erythema, swelling, or exudate on post pharynx.  Tonsils not swollen or erythematous. Hearing normal.  Neck: Supple, thyroid normal.  Respiratory: Respiratory effort normal, BS equal bilaterally without rales, rhonchi, wheezing or stridor.  Cardio: RRR with no MRGs. Brisk peripheral pulses without edema.  Abdomen: Soft, + BS.  Non tender, no guarding, rebound, hernias, masses. Lymphatics: Non tender without lymphadenopathy.  Musculoskeletal: Full ROM, 5/5 strength, Normal gait, no crepitus, popping, of right hip Skin: Warm, dry without rashes, lesions, ecchymosis.  Neuro: Cranial nerves intact. No cerebellar symptoms.  Psych: Awake and oriented X 3,  normal affect, Insight and Judgment appropriate.    Izora Ribas, NP 6:13 PM [facility]

## 2018-03-18 ENCOUNTER — Encounter: Payer: Self-pay | Admitting: Adult Health

## 2018-03-18 ENCOUNTER — Ambulatory Visit (INDEPENDENT_AMBULATORY_CARE_PROVIDER_SITE_OTHER): Payer: Medicare Other | Admitting: Adult Health

## 2018-03-18 VITALS — BP 124/74 | HR 63 | Temp 97.3°F | Ht 63.0 in | Wt 159.0 lb

## 2018-03-18 DIAGNOSIS — R7303 Prediabetes: Secondary | ICD-10-CM | POA: Diagnosis not present

## 2018-03-18 DIAGNOSIS — G4700 Insomnia, unspecified: Secondary | ICD-10-CM

## 2018-03-18 DIAGNOSIS — E663 Overweight: Secondary | ICD-10-CM | POA: Diagnosis not present

## 2018-03-18 DIAGNOSIS — Z79899 Other long term (current) drug therapy: Secondary | ICD-10-CM | POA: Diagnosis not present

## 2018-03-18 DIAGNOSIS — K219 Gastro-esophageal reflux disease without esophagitis: Secondary | ICD-10-CM | POA: Diagnosis not present

## 2018-03-18 DIAGNOSIS — I10 Essential (primary) hypertension: Secondary | ICD-10-CM | POA: Diagnosis not present

## 2018-03-18 DIAGNOSIS — E785 Hyperlipidemia, unspecified: Secondary | ICD-10-CM

## 2018-03-18 DIAGNOSIS — E559 Vitamin D deficiency, unspecified: Secondary | ICD-10-CM | POA: Diagnosis not present

## 2018-03-18 MED ORDER — PREDNISONE 20 MG PO TABS: ORAL_TABLET | ORAL | 0 refills | Status: DC

## 2018-03-18 NOTE — Patient Instructions (Signed)
Increase vitamin D to 5000 mg twice daily     Preventing High Cholesterol Cholesterol is a waxy, fat-like substance that your body needs in small amounts. Your liver makes all the cholesterol that your body needs. Having high cholesterol (hypercholesterolemia) increases your risk for heart disease and stroke. Extra (excess) cholesterol comes from the food you eat, such as animal-based fat (saturated fat) from meat and some dairy products. High cholesterol can often be prevented with diet and lifestyle changes. If you already have high cholesterol, you can control it with diet and lifestyle changes, as well as medicine. What nutrition changes can be made?  Eat less saturated fat. Foods that contain saturated fat include red meat and some dairy products.  Avoid processed meats, like bacon and lunch meats.  Avoid trans fats, which are found in margarine and some baked goods.  Avoid foods and beverages that have added sugars.  Eat more fruits, vegetables, and whole grains.  Choose healthy sources of protein, such as fish, poultry, and nuts.  Choose healthy sources of fat, such as: ? Nuts. ? Vegetable oils, especially olive oil. ? Fish that have healthy fats (omega-3 fatty acids), such as mackerel or salmon. What lifestyle changes can be made?  Lose weight if you are overweight. Losing 5-10 lb (2.3-4.5 kg) can help prevent or control high cholesterol and reduce your risk for diabetes and high blood pressure. Ask your health care provider to help you with a diet and exercise plan to safely lose weight.  Get enough exercise. Do at least 150 minutes of moderate-intensity exercise each week. ? You could do this in short exercise sessions several times a day, or you could do longer exercise sessions a few times a week. For example, you could take a brisk 10-minute walk or bike ride, 3 times a day, for 5 days a week.  Do not smoke. If you need help quitting, ask your health care  provider.  Limit your alcohol intake. If you drink alcohol, limit alcohol intake to no more than 1 drink a day for nonpregnant women and 2 drinks a day for men. One drink equals 12 oz of beer, 5 oz of wine, or 1 oz of hard liquor. Why are these changes important? If you have high cholesterol, deposits (plaques) may build up on the walls of your blood vessels. Plaques make the arteries narrower and stiffer, which can restrict or block blood flow and cause blood clots to form. This greatly increases your risk for heart attack and stroke. Making diet and lifestyle changes can reduce your risk for these life-threatening conditions. What can I do to lower my risk?  Manage your risk factors for high cholesterol. Talk with your health care provider about all of your risk factors and how to lower your risk.  Manage other conditions that you have, such as diabetes or high blood pressure (hypertension).  Have your cholesterol checked at regular intervals.  Keep all follow-up visits as told by your health care provider. This is important. How is this treated? In addition to diet and lifestyle changes, your health care provider may recommend medicines to help lower cholesterol, such as a medicine to reduce the amount of cholesterol made in your liver. You may need medicine if:  Diet and lifestyle changes do not lower your cholesterol enough.  You have high cholesterol and other risk factors for heart disease or stroke.  Take over-the-counter and prescription medicines only as told by your health care provider. Where to find  more information:  American Heart Association: ThisTune.com.pt.jsp  National Heart, Lung, and Blood Institute: FrenchToiletries.com.cy Summary  High cholesterol increases your risk for heart disease and stroke. By keeping your cholesterol level low, you can reduce  your risk for these conditions.  Diet and lifestyle changes are the most important steps in preventing high cholesterol.  Work with your health care provider to manage your risk factors, and have your blood tested regularly. This information is not intended to replace advice given to you by your health care provider. Make sure you discuss any questions you have with your health care provider. Document Released: 07/15/2015 Document Revised: 03/08/2016 Document Reviewed: 03/08/2016 Elsevier Interactive Patient Education  [employer].

## 2018-03-22 ENCOUNTER — Other Ambulatory Visit: Payer: Medicare Other

## 2018-03-22 DIAGNOSIS — E559 Vitamin D deficiency, unspecified: Secondary | ICD-10-CM | POA: Diagnosis not present

## 2018-03-22 DIAGNOSIS — Z79899 Other long term (current) drug therapy: Secondary | ICD-10-CM | POA: Diagnosis not present

## 2018-03-22 DIAGNOSIS — K219 Gastro-esophageal reflux disease without esophagitis: Secondary | ICD-10-CM | POA: Diagnosis not present

## 2018-03-22 DIAGNOSIS — E785 Hyperlipidemia, unspecified: Secondary | ICD-10-CM | POA: Diagnosis not present

## 2018-03-22 DIAGNOSIS — R7303 Prediabetes: Secondary | ICD-10-CM

## 2018-03-22 DIAGNOSIS — I10 Essential (primary) hypertension: Secondary | ICD-10-CM | POA: Diagnosis not present

## 2018-03-23 LAB — COMPLETE METABOLIC PANEL WITH GFR
AG Ratio: 1.8 (calc) (ref 1.0–2.5)
ALT: 11 U/L (ref 6–29)
AST: 17 U/L (ref 10–35)
Albumin: 4.1 g/dL (ref 3.6–5.1)
Alkaline phosphatase (APISO): 70 U/L (ref 33–130)
BUN: 12 mg/dL (ref 7–25)
CO2: 28 mmol/L (ref 20–32)
Calcium: 9.4 mg/dL (ref 8.6–10.4)
Chloride: 108 mmol/L (ref 98–110)
Creat: 0.85 mg/dL (ref 0.50–0.99)
GFR, Est African American: 82 mL/min/{1.73_m2} (ref >=60)
GFR, Est Non African American: 71 mL/min/{1.73_m2} (ref >=60)
Globulin: 2.3 g/dL (calc) (ref 1.9–3.7)
Glucose, Bld: 91 mg/dL (ref 65–99)
Potassium: 4.1 mmol/L (ref 3.5–5.3)
Sodium: 142 mmol/L (ref 135–146)
Total Bilirubin: 0.5 mg/dL (ref 0.2–1.2)
Total Protein: 6.4 g/dL (ref 6.1–8.1)

## 2018-03-23 LAB — CBC WITH DIFFERENTIAL/PLATELET
Basophils Absolute: 40 cells/uL (ref 0–200)
Basophils Relative: 0.9 %
Eosinophils Absolute: 180 cells/uL (ref 15–500)
Eosinophils Relative: 4.1 %
HCT: 39.8 % (ref 35.0–45.0)
Hemoglobin: 13.4 g/dL (ref 11.7–15.5)
Lymphs Abs: 1650 cells/uL (ref 850–3900)
MCH: 31.7 pg (ref 27.0–33.0)
MCHC: 33.7 g/dL (ref 32.0–36.0)
MCV: 94.1 fL (ref 80.0–100.0)
MPV: 10.1 fL (ref 7.5–12.5)
Monocytes Relative: 7.6 %
Neutro Abs: 2196 cells/uL (ref 1500–7800)
Neutrophils Relative %: 49.9 %
Platelets: 251 10*3/uL (ref 140–400)
RBC: 4.23 10*6/uL (ref 3.80–5.10)
RDW: 12.2 % (ref 11.0–15.0)
Total Lymphocyte: 37.5 %
WBC mixed population: 334 cells/uL (ref 200–950)
WBC: 4.4 10*3/uL (ref 3.8–10.8)

## 2018-03-23 LAB — MAGNESIUM: Magnesium: 2.1 mg/dL (ref 1.5–2.5)

## 2018-03-23 LAB — LIPID PANEL
Cholesterol: 193 mg/dL (ref <200)
HDL: 56 mg/dL (ref >50)
LDL CHOLESTEROL (CALC): 121 mg/dL — AB
NON-HDL CHOLESTEROL (CALC): 137 mg/dL — AB (ref <130)
Total CHOL/HDL Ratio: 3.4 (calc) (ref <5.0)
Triglycerides: 72 mg/dL (ref <150)

## 2018-03-23 LAB — HEMOGLOBIN A1C
HEMOGLOBIN A1C: 5.8 %{Hb} — AB (ref <5.7)
MEAN PLASMA GLUCOSE: 120 (calc)
eAG (mmol/L): 6.6 (calc)

## 2018-03-23 LAB — VITAMIN D 25 HYDROXY (VIT D DEFICIENCY, FRACTURES): VIT D 25 HYDROXY: 59 ng/mL (ref 30–100)

## 2018-03-23 LAB — TSH: TSH: 0.63 m[IU]/L (ref 0.40–4.50)

## 2018-04-05 ENCOUNTER — Other Ambulatory Visit: Payer: Self-pay | Admitting: Internal Medicine

## 2018-04-05 DIAGNOSIS — G4700 Insomnia, unspecified: Secondary | ICD-10-CM

## 2018-04-22 ENCOUNTER — Ambulatory Visit (INDEPENDENT_AMBULATORY_CARE_PROVIDER_SITE_OTHER): Payer: Medicare Other | Admitting: Internal Medicine

## 2018-04-22 ENCOUNTER — Encounter: Payer: Self-pay | Admitting: Internal Medicine

## 2018-04-22 VITALS — BP 124/70 | HR 68 | Temp 97.3°F | Resp 16 | Ht 63.0 in | Wt 155.0 lb

## 2018-04-22 DIAGNOSIS — M791 Myalgia, unspecified site: Secondary | ICD-10-CM

## 2018-04-22 MED ORDER — PREDNISONE 20 MG PO TABS: ORAL_TABLET | ORAL | 0 refills | Status: DC

## 2018-04-22 NOTE — Progress Notes (Signed)
  Subjective:    Patient ID: Susan Carr, female    DOB: 06/25/1951, 67 y.o.   MRN: [id number]  HPI    This nice 67 yo MWF present with c/o bilat forearm & wrist pains which she relates to caring for her husband with severe Dementia & the difficulty in positioning him in bed.   Medication Sig  . acyclovir (ZOVIRAX) 200 MG capsule TAKE 2 CAPSULES BY MOUTH EVERY DAY AS DIRECTED (Patient taking differently: as needed. )  . aspirin EC 81 MG tablet Take 81 mg by mouth daily.  . Cholecalciferol (VITAMIN D PO) Take 5,000 Units by mouth daily.  . eszopiclone (LUNESTA) 2 MG TABS tablet TAKE 1 TABLET(2 MG) BY MOUTH AT BEDTIME AS NEEDED FOR SLEEP  . ezetimibe (ZETIA) 10 MG tablet TAKE 1 TABLET(10 MG) BY MOUTH DAILY  . fish oil-omega-3 fatty acids 1000 MG capsule Take 1 g by mouth every morning.  . fluticasone (FLONASE) 50 MCG/ACT nasal spray Place 2 sprays into both nostrils daily.  Marland Kitchen gabapentin (NEURONTIN) 100 MG capsule TAKE 1 TO 3 CAPSULES BY MOUTH AT BEDTIME  . Glycerin-Polysorbate 80 (REFRESH DRY EYE THERAPY OP) Apply 1 drop to eye daily.   Marland Kitchen ibuprofen (ADVIL,MOTRIN) 800 MG tablet Take 1 tablet (800 mg total) by mouth every 8 (eight) hours as needed.  . pantoprazole (PROTONIX) 40 MG tablet Take 1 tablet (40 mg total) by mouth daily.  . ranitidine (ZANTAC) 300 MG tablet Take 1 tablet 2 x / day as needed for indigestion & heartburn  . vitamin C (ASCORBIC ACID) 500 MG tablet Take 500 mg by mouth 2 (two) times daily.  . vitamin E 100 UNIT capsule Take 100 Units by mouth every morning.  . celecoxib (CELEBREX) 100 MG capsule Take 1 capsule (100 mg total) by mouth daily as needed. (Patient not taking: Reported on 04/22/2018)  . rosuvastatin (CRESTOR) 40 MG tablet Take 1/2 to 1 tablet daily or as directed for Cholesterol (Patient not taking: Reported on 03/18/2018)   No Known Allergies   Past Medical History:  Diagnosis Date  . Arthritis   . Hyperlipidemia   . Labile hypertension    Past  Surgical History:  Procedure Laterality Date  . NECK SURGERY    . REDUCTION MAMMAPLASTY Bilateral 2007   Review of Systems    10 point systems review negative except as above.    Objective:   Physical Exam  BP 124/70   Pulse 68   Temp (!) 97.3 F (36.3 C)   Resp 16   Ht 5\' 3"  (1.6 m)   Wt 155 lb (70.3 kg)   BMI 27.46 kg/m   HEENT - WNL. Neck - supple.  Chest - Clear equal BS. Cor - Nl HS. RRR w/o sigm.  No edema. MS- FROM w/o deformities.  Nl motor strength of UE's.Gait Nl. Neuro -  Nl w/o focal abnormalities.    Assessment & Plan:   1. Myalgia  - Magnesium - Sedimentation rate - C-reactive protein - CK  Empiric trial on   - predniSONE (DELTASONE) 20 MG tablet; 1 tab 3 x day for 3 days, then 1 tab 2 x day for 3 days, then 1 tab 1 x day for 5 days  Dispense: 20 tablet; Refill: 0

## 2018-04-22 NOTE — Patient Instructions (Signed)

## 2018-04-23 LAB — MAGNESIUM: Magnesium: 2 mg/dL (ref 1.5–2.5)

## 2018-04-23 LAB — SEDIMENTATION RATE: Sed Rate: 6 mm/h (ref 0–30)

## 2018-04-23 LAB — CK: Total CK: 132 U/L (ref 29–143)

## 2018-04-23 LAB — C-REACTIVE PROTEIN: CRP: 1.3 mg/L (ref <8.0)

## 2018-04-25 ENCOUNTER — Encounter: Payer: Self-pay | Admitting: Internal Medicine

## 2018-05-05 ENCOUNTER — Other Ambulatory Visit: Payer: Self-pay | Admitting: Physician Assistant

## 2018-05-05 DIAGNOSIS — G4700 Insomnia, unspecified: Secondary | ICD-10-CM

## 2018-05-06 ENCOUNTER — Ambulatory Visit: Payer: Self-pay | Admitting: Internal Medicine

## 2018-05-07 ENCOUNTER — Ambulatory Visit (INDEPENDENT_AMBULATORY_CARE_PROVIDER_SITE_OTHER): Payer: Medicare Other | Admitting: Internal Medicine

## 2018-05-07 VITALS — BP 112/60 | HR 76 | Temp 97.3°F | Resp 16 | Ht 63.0 in | Wt 155.8 lb

## 2018-05-07 DIAGNOSIS — M791 Myalgia, unspecified site: Secondary | ICD-10-CM

## 2018-05-07 MED ORDER — MELOXICAM 15 MG PO TABS: ORAL_TABLET | ORAL | 1 refills | Status: DC

## 2018-05-09 ENCOUNTER — Encounter: Payer: Self-pay | Admitting: Internal Medicine

## 2018-05-09 NOTE — Progress Notes (Signed)
  Subjective:    Patient ID: Susan Carr, female    DOB: 08/29/1950, 67 y.o.   MRN: [id number]  HPI     This very nice 67 yo MBF returns for f/u of bilat wrist & arm pains aggravated doing primary care for her bedridden spouse with severe Dementia. She had screening with Normal sed rate, CRP, CPK & magnesium. She had an empiric trial on a prednisone taper with improvement.    Medication Sig  . acyclovir (ZOVIRAX) 200 MG capsule TAKE 2 CAPSULES BY MOUTH EVERY DAY AS DIRECTED (Patient taking differently: as needed. )  . aspirin EC 81 MG tablet Take 81 mg by mouth daily.  . Cholecalciferol (VITAMIN D PO) Take 5,000 Units by mouth daily.  . eszopiclone (LUNESTA) 2 MG TABS tablet TAKE 1 TABLET(2 MG) BY MOUTH AT BEDTIME AS NEEDED FOR SLEEP  . ezetimibe (ZETIA) 10 MG tablet TAKE 1 TABLET(10 MG) BY MOUTH DAILY  . fish oil-omega-3 fatty acids 1000 MG capsule Take 1 g by mouth every morning.  . fluticasone (FLONASE) 50 MCG/ACT nasal spray Place 2 sprays into both nostrils daily.  Marland Kitchen gabapentin (NEURONTIN) 100 MG capsule TAKE 1 TO 3 CAPSULES BY MOUTH AT BEDTIME  . Glycerin-Polysorbate 80 (REFRESH DRY EYE THERAPY OP) Apply 1 drop to eye daily.   . pantoprazole (PROTONIX) 40 MG tablet Take 1 tablet (40 mg total) by mouth daily.  . ranitidine (ZANTAC) 300 MG tablet Take 1 tablet 2 x / day as needed for indigestion & heartburn  . vitamin C (ASCORBIC ACID) 500 MG tablet Take 500 mg by mouth 2 (two) times daily.  . vitamin E 100 UNIT capsule Take 100 Units by mouth every morning.  . celecoxib (CELEBREX) 100 MG capsule Take 1 capsule (100 mg total) by mouth daily as needed.  Marland Kitchen ibuprofen (ADVIL,MOTRIN) 800 MG tablet Take 1 tablet (800 mg total) by mouth every 8 (eight) hours as needed.  . predniSONE (DELTASONE) 20 MG tablet 1 tab 3 x day for 3 days, then 1 tab 2 x day for 3 days, then 1 tab 1 x day for 5 days   No Active Allergies   Past Medical History:  Diagnosis Date  . Arthritis   . Hyperlipidemia    . Labile hypertension    Past Surgical History:  Procedure Laterality Date  . NECK SURGERY    . REDUCTION MAMMAPLASTY Bilateral 2007   Review of Systems   10 point systems review negative except as above.    Objective:   Physical Exam  BP 112/60   Pulse 76   Temp (!) 97.3 F (36.3 C)   Resp 16   Ht 5\' 3"  (1.6 m)   Wt 155 lb 12.8 oz (70.7 kg)   BMI 27.60 kg/m   HEENT - WNL. Neck - supple.  Chest - Clear equal BS. Cor - Nl HS. RRR w/o sig MGR. PP 1(+). No edema. MS- FROM w/o deformities.  ROM of UE's is Normal. Gait Nl. Neuro -  Nl w/o focal abnormalities.    Assessment & Plan:   1. Myalgia  - meloxicam (MOBIC) 15 MG tablet; Take 1/2 to 1 tablet with supper for Pain & Inflammation  Dispense: 90 tablet; Refill: 1

## 2018-05-12 ENCOUNTER — Other Ambulatory Visit: Payer: Self-pay | Admitting: Internal Medicine

## 2018-05-19 DIAGNOSIS — D485 Neoplasm of uncertain behavior of skin: Secondary | ICD-10-CM | POA: Diagnosis not present

## 2018-05-19 DIAGNOSIS — L570 Actinic keratosis: Secondary | ICD-10-CM | POA: Diagnosis not present

## 2018-05-19 NOTE — Progress Notes (Signed)
Medicare wellness and 3 month follow up  Assessment and Plan:  Left lower quadrant abdominal pain -     CT ABDOMEN PELVIS W CONTRAST; Future -     ciprofloxacin (CIPRO) 500 MG tablet; Take 1 tablet (500 mg total) by mouth 2 (two) times daily. -     metroNIDAZOLE (FLAGYL) 500 MG tablet; Take 1 tablet (500 mg total) by mouth 3 (three) times daily for 7 days. - declines labs - patient thinks it is a pulled muscle but has had loose stools and on exam has LLQ pain with rebound - will treat, get CT AB/pelvic rule out diverticulitis - strict ER precautions discussed with patient.   Encounter for Medicare annual wellness exam 1 year  Essential hypertension - continue medications, DASH diet, exercise and monitor at home. Call if greater than 130/80.   Gastroesophageal reflux disease, esophagitis presence not specified Continue PPI/H2 blocker, diet discussed  Vitamin D deficiency Continue supplement  Medication management  Age-related osteoporosis without current pathological fracture Off fosamax, declines DEXA, states too busy with husband at this time  Hyperlipidemia, unspecified hyperlipidemia type check lipids decrease fatty foods increase activity.   Prediabetes Discussed disease progression and risks Discussed diet/exercise, weight management and risk modification  Insomnia, unspecified type Does not take sedating meds due to her husband We discussed possibly add on low dose zoloft at night for anxiety/sleep, she will consider it and discuss next OV  Anxiety state Declines meds at this time, will discuss zoloft next OV Discussed personal care, caregiver fatigue, etc  Overweight (BMI 25.0-29.9) - long discussion about weight loss, diet, and exercise -recommended diet heavy in fruits and veggies and low in animal meats, cheeses, and dairy products   Discussed med's effects and SE's. Screening labs and tests as requested with regular follow-up as recommended. Over 40  minutes of exam, counseling, chart review and critical decision making was performed  Future Appointments  Date Time Provider [place]  08/25/2018  4:30 PM Unk Pinto, MD [facility] None  05/31/2019  4:15 PM Vicie Mutters, PA-C [facility] None     During the course of the visit the patient was educated and counseled about appropriate screening and preventive services including:    Pneumococcal vaccine   Prevnar 13  Influenza vaccine  Td vaccine  Screening electrocardiogram  Bone densitometry screening  Colorectal cancer screening  Diabetes screening  Glaucoma screening  Nutrition counseling   Advanced directives: requested  HPI  67 y.o. female  presents for wellness visit and 3 month follow up for HTN, chol, preDM, and obesity.   She has been doing heat and advil for a pulled muscle on left lower AB X Monday. Fritz Pickerel went to respite. Worse with bending/movement, some loose stools no fever, chills, constipation. She also has been using a band. No injury.   Her blood pressure has been controlled at home, today their BP is BP: 126/74.  She does not workout. She denies chest pain, shortness of breath, dizziness.    She does not work out secondary to time constraints as she is working two jobs and her taking care of her husband, he is now in hospice.   She is not on cholesterol medication, was on zetia at one point. Her cholesterol is at goal. The cholesterol last visit was:   Lab Results  Component Value Date   CHOL 193 03/22/2018   HDL 56 03/22/2018   LDLCALC 121 (H) 03/22/2018   TRIG 72 03/22/2018   CHOLHDL 3.4 03/22/2018  .  She has been working on diet and exercise for prediabetes, she is on bASA, she is on ACE/ARB and denies foot ulcerations, hyperglycemia, hypoglycemia , increased appetite, nausea, paresthesia of the feet, polydipsia, polyuria, visual disturbances, vomiting and weight loss. Last A1C in the office was:  Lab Results  Component  Value Date   HGBA1C 5.8 (H) 03/22/2018  .  Patient is on Vitamin D supplement.   Lab Results  Component Value Date   VD25OH 20 03/22/2018     She is still having some issues with insomnia but taking 2 of the gabapentin can help, she has some feet burning that this helps.  She feels that she cannot relax because she is afraid her husband might get up and she will need to help him.    BMI is Body mass index is 27.74 kg/m., she is working on diet and exercise. Wt Readings from Last 3 Encounters:  05/20/18 156 lb 9.6 oz (71 kg)  05/07/18 155 lb 12.8 oz (70.7 kg)  04/22/18 155 lb (70.3 kg)    Current Medications:  Current Outpatient Medications on File Prior to Visit  Medication Sig Dispense Refill  . acyclovir (ZOVIRAX) 200 MG capsule TAKE 2 CAPSULES BY MOUTH EVERY DAY AS DIRECTED (Patient taking differently: as needed. ) 180 capsule 3  . aspirin EC 81 MG tablet Take 81 mg by mouth daily.    . Cholecalciferol (VITAMIN D PO) Take 5,000 Units by mouth daily.    . eszopiclone (LUNESTA) 2 MG TABS tablet TAKE 1 TABLET(2 MG) BY MOUTH AT BEDTIME AS NEEDED FOR SLEEP 30 tablet 0  . ezetimibe (ZETIA) 10 MG tablet TAKE 1 TABLET(10 MG) BY MOUTH DAILY 90 tablet 1  . fish oil-omega-3 fatty acids 1000 MG capsule Take 1 g by mouth every morning.    . fluticasone (FLONASE) 50 MCG/ACT nasal spray Place 2 sprays into both nostrils daily. 16 g 0  . gabapentin (NEURONTIN) 100 MG capsule TAKE 1 TO 3 CAPSULES BY MOUTH AT BEDTIME 240 capsule 0  . Glycerin-Polysorbate 80 (REFRESH DRY EYE THERAPY OP) Apply 1 drop to eye daily.     . meloxicam (MOBIC) 15 MG tablet Take 1/2 to 1 tablet with supper for Pain & Inflammation 90 tablet 1  . pantoprazole (PROTONIX) 40 MG tablet Take 1 tablet (40 mg total) by mouth daily. 30 tablet 3  . ranitidine (ZANTAC) 300 MG tablet Take 1 tablet 2 x / day as needed for indigestion & heartburn 180 tablet 3  . vitamin C (ASCORBIC ACID) 500 MG tablet Take 500 mg by mouth 2 (two) times  daily.    . vitamin E 100 UNIT capsule Take 100 Units by mouth every morning.     No current facility-administered medications on file prior to visit.     Health Maintenance:   Immunization History  Administered Date(s) Administered  . Tdap 08/16/2012   Tetanus: 2014 Pneumovax: Declined Zostavax: Declined Influenza declines  MGM: 12/2017 DEXA: Declined cologuard 03/04/2016 + Colonoscopy 06/12/2017 Last Eye Exam:  Dr. Truman Hayward Sleep study 04/2014  Patient Care Team: Unk Pinto, MD as PCP - General (Internal Medicine) Barbaraann Cao, [place] as Referring Physician (Optometry) Clarene Essex, MD as Consulting Physician (Gastroenterology) Christophe Louis, MD as Consulting Physician (Obstetrics and Gynecology) Melrose Nakayama, MD as Consulting Physician (Orthopedic Surgery) Dorna Leitz, MD as Consulting Physician (Orthopedic Surgery)  Medical History:  Past Medical History:  Diagnosis Date  . Arthritis   . Hyperlipidemia   . Labile hypertension  Allergies No Active Allergies  SURGICAL HISTORY She  has a past surgical history that includes Neck surgery and Reduction mammaplasty (Bilateral, 2007). FAMILY HISTORY Her family history includes Diabetes in her mother and other; Heart attack in her father and mother; Multiple sclerosis in her other. SOCIAL HISTORY She  reports that she has never smoked. She has never used smokeless tobacco. She reports that she does not drink alcohol or use drugs.  MEDICARE WELLNESS OBJECTIVES: Physical activity: Current Exercise Habits: Home exercise routine, Type of exercise: walking Cardiac risk factors: Cardiac Risk Factors include: advanced age (>44men, >54 women);dyslipidemia;hypertension;sedentary lifestyle Depression/mood screen:   Depression screen [facility] 2/9 05/20/2018  Decreased Interest 0  Down, Depressed, Hopeless 0  PHQ - 2 Score 0    ADLs:  In your present state of health, do you have any difficulty performing the following  activities: 05/20/2018 04/25/2018  Hearing? N N  Vision? N N  Difficulty concentrating or making decisions? N N  Walking or climbing stairs? N N  Dressing or bathing? N N  Doing errands, shopping? N N  Some recent data might be hidden     Cognitive Testing  Alert? Yes  Normal Appearance?Yes  Oriented to person? Yes  Place? Yes   Time? Yes  Recall of three objects?  Yes  Can perform simple calculations? Yes  Displays appropriate judgment?Yes  Can read the correct time from a watch face?Yes  EOL planning: Does Patient Have a Medical Advance Directive?: Yes Type of Advance Directive: Healthcare Power of Attorney, Living will [place] in Chart?: No - copy requested  Review of Systems: Review of Systems  Constitutional: Negative for chills, fever and malaise/fatigue.  HENT: Negative for congestion, ear pain and sore throat.   Eyes: Negative.   Respiratory: Negative for cough, shortness of breath and wheezing.   Cardiovascular: Negative for chest pain, palpitations and leg swelling.  Gastrointestinal: Positive for abdominal pain and diarrhea. Negative for blood in stool, constipation, heartburn, melena, nausea and vomiting.  Genitourinary: Negative.   Skin: Negative.   Neurological: Negative for dizziness, sensory change, loss of consciousness and headaches.  Psychiatric/Behavioral: Negative for depression. The patient is not nervous/anxious and does not have insomnia.     Physical Exam: Estimated body mass index is 27.74 kg/m as calculated from the following:   Height as of this encounter: 5\' 3"  (1.6 m).   Weight as of this encounter: 156 lb 9.6 oz (71 kg). BP 126/74   Pulse 68   Temp (!) 97.5 F (36.4 C)   Resp 16   Ht 5\' 3"  (1.6 m)   Wt 156 lb 9.6 oz (71 kg)   SpO2 99%   BMI 27.74 kg/m   General Appearance: Well nourished well developed, in no apparent distress.  Eyes: PERRLA, EOMs, conjunctiva no swelling or erythema ENT/Mouth: Ear  canals normal without obstruction, swelling, erythema, or discharge.  TMs normal bilaterally with no erythema, bulging, retraction, or loss of landmark.  Oropharynx moist and clear with no exudate, erythema, or swelling.   Neck: Supple, thyroid normal. No bruits.  No cervical adenopathy Respiratory: Respiratory effort normal, Breath sounds clear A&P without wheeze, rhonchi, rales.   Cardio: RRR without murmurs, rubs or gallops. Brisk peripheral pulses without edema.  Chest: symmetric, with normal excursions Abdomen: Soft, + LLQ pain with rebound, no guarding,  hernias, masses, or organomegaly.  Lymphatics: Non tender without lymphadenopathy.  Musculoskeletal: Full ROM all peripheral extremities,5/5 strength, and normal gait.  Skin: Warm,  dry without rashes, lesions, ecchymosis. Neuro: Awake and oriented X 3, Cranial nerves intact, reflexes equal bilaterally. Normal muscle tone, no cerebellar symptoms. Sensation intact.  Psych:  normal affect, Insight and Judgment appropriate.    Medicare Attestation I have personally reviewed: The patient's medical and social history Their use of alcohol, tobacco or illicit drugs Their current medications and supplements The patient's functional ability including ADLs,fall risks, home safety risks, cognitive, and hearing and visual impairment Diet and physical activities Evidence for depression or mood disorders  The patient's weight, height, BMI, and visual acuity have been recorded in the chart.  I have made referrals, counseling, and provided education to the patient based on review of the above and I have provided the patient with a written personalized care plan for preventive services.    Vicie Mutters 6:20 AM [facility]

## 2018-05-20 ENCOUNTER — Encounter: Payer: Self-pay | Admitting: Physician Assistant

## 2018-05-20 ENCOUNTER — Ambulatory Visit (INDEPENDENT_AMBULATORY_CARE_PROVIDER_SITE_OTHER): Payer: Medicare Other | Admitting: Physician Assistant

## 2018-05-20 VITALS — BP 126/74 | HR 68 | Temp 97.5°F | Resp 16 | Ht 63.0 in | Wt 156.6 lb

## 2018-05-20 DIAGNOSIS — R7303 Prediabetes: Secondary | ICD-10-CM

## 2018-05-20 DIAGNOSIS — G4700 Insomnia, unspecified: Secondary | ICD-10-CM | POA: Diagnosis not present

## 2018-05-20 DIAGNOSIS — E663 Overweight: Secondary | ICD-10-CM

## 2018-05-20 DIAGNOSIS — R1032 Left lower quadrant pain: Secondary | ICD-10-CM | POA: Diagnosis not present

## 2018-05-20 DIAGNOSIS — Z79899 Other long term (current) drug therapy: Secondary | ICD-10-CM

## 2018-05-20 DIAGNOSIS — Z0001 Encounter for general adult medical examination with abnormal findings: Secondary | ICD-10-CM

## 2018-05-20 DIAGNOSIS — K219 Gastro-esophageal reflux disease without esophagitis: Secondary | ICD-10-CM | POA: Diagnosis not present

## 2018-05-20 DIAGNOSIS — M810 Age-related osteoporosis without current pathological fracture: Secondary | ICD-10-CM

## 2018-05-20 DIAGNOSIS — R6889 Other general symptoms and signs: Secondary | ICD-10-CM

## 2018-05-20 DIAGNOSIS — F411 Generalized anxiety disorder: Secondary | ICD-10-CM | POA: Diagnosis not present

## 2018-05-20 DIAGNOSIS — E785 Hyperlipidemia, unspecified: Secondary | ICD-10-CM

## 2018-05-20 DIAGNOSIS — E559 Vitamin D deficiency, unspecified: Secondary | ICD-10-CM | POA: Diagnosis not present

## 2018-05-20 DIAGNOSIS — Z0000 Encounter for general adult medical examination without abnormal findings: Secondary | ICD-10-CM

## 2018-05-20 DIAGNOSIS — I10 Essential (primary) hypertension: Secondary | ICD-10-CM

## 2018-05-20 MED ORDER — CIPROFLOXACIN HCL 500 MG PO TABS: 500.0000 mg | ORAL_TABLET | Freq: Two times a day (BID) | ORAL | 0 refills | Status: DC

## 2018-05-20 MED ORDER — METRONIDAZOLE 500 MG PO TABS: 500.0000 mg | ORAL_TABLET | Freq: Three times a day (TID) | ORAL | 0 refills | Status: AC

## 2018-05-20 NOTE — Patient Instructions (Addendum)
Get on B12   Will discuss medication next time  Start on cipro/flaygl for diverticulitis Do liquid diet for a few days with jello/brothes then go to bowel rest Bowel rest means no wheat/fiber, so please eat white stuff like potatoes, soup, crackers until you are feeling better and then slowly advance your diet back to veggies and fiber like wheat.    Diverticulitis Diverticulitis is inflammation or infection of small pouches in your colon that form when you have a condition called diverticulosis. The pouches in your colon are called diverticula. Your colon, or large intestine, is where water is absorbed and stool is formed. Complications of diverticulitis can include:  Bleeding.  Severe infection.  Severe pain.  Perforation of your colon.  Obstruction of your colon.  What are the causes? Diverticulitis is caused by bacteria. Diverticulitis happens when stool becomes trapped in diverticula. This allows bacteria to grow in the diverticula, which can lead to inflammation and infection. What increases the risk? People with diverticulosis are at risk for diverticulitis. Eating a diet that does not include enough fiber from fruits and vegetables may make diverticulitis more likely to develop. What are the signs or symptoms? Symptoms of diverticulitis may include:  Abdominal pain and tenderness. The pain is normally located on the left side of the abdomen, but may occur in other areas.  Fever and chills.  Bloating.  Cramping.  Nausea.  Vomiting.  Constipation.  Diarrhea.  Blood in your stool.  How is this diagnosed? Your health care provider will ask you about your medical history and do a physical exam. You may need to have tests done because many medical conditions can cause the same symptoms as diverticulitis. Tests may include:  Blood tests.  Urine tests.  Imaging tests of the abdomen, including X-rays and CT scans.  When your condition is under control, your  health care provider may recommend that you have a colonoscopy. A colonoscopy can show how severe your diverticula are and whether something else is causing your symptoms. How is this treated? Most cases of diverticulitis are mild and can be treated at home. Treatment may include:  Taking over-the-counter pain medicines.  Following a clear liquid diet.  Taking antibiotic medicines by mouth for 7-10 days.  More severe cases may be treated at a hospital. Treatment may include:  Not eating or drinking.  Taking prescription pain medicine.  Receiving antibiotic medicines through an IV tube.  Receiving fluids and nutrition through an IV tube.  Surgery.  Follow these instructions at home:  Follow your health care provider's instructions carefully.  Follow a full liquid diet or other diet as directed by your health care provider. After your symptoms improve, your health care provider may tell you to change your diet. He or she may recommend you eat a high-fiber diet. Fruits and vegetables are good sources of fiber. Fiber makes it easier to pass stool.  Take fiber supplements or probiotics as directed by your health care provider.  Only take medicines as directed by your health care provider.  Keep all your follow-up appointments. Contact a health care provider if:  Your pain does not improve.  You have a hard time eating food.  Your bowel movements do not return to normal. Get help right away if:  Your pain becomes worse.  Your symptoms do not get better.  Your symptoms suddenly get worse.  You have a fever.  You have repeated vomiting.  You have bloody or black, tarry stools. This information is  not intended to replace advice given to you by your health care provider. Make sure you discuss any questions you have with your health care provider. Document Released: 04/09/2005 Document Revised: 12/06/2015 Document Reviewed: 05/25/2013 Elsevier Interactive Patient Education   2017 [place], ibuprofen is an antiinflammatory You can take tylenol (500mg ) or tylenol arthritis (650mg ) with the meloxicam/antiinflammatories. The max you can take of tylenol a day is 3000mg  daily, this is a max of 6 pills a day of the regular tyelnol (500mg ) or a max of 4 a day of the tylenol arthritis (650mg ) as long as no other medications you are taking contain tylenol.   this can cause inflammation in your stomach and can cause ulcers or bleeding, this will look like black tarry stools Make sure you taking it with food Try not to take it daily, take AS needed Can take with pepcid   Take gabapentin 300mg  or 3 pills tonight with your lunesta  11 Tips to Follow:  1. No caffeine after 3pm: Avoid beverages with caffeine (soda, tea, energy drinks, etc.) especially after 3pm. 2. Don't go to bed hungry: Have your evening meal at least 3 hrs. before going to sleep. It's fine to have a small bedtime snack such as a glass of milk and a few crackers but don't have a big meal. 3. Have a nightly routine before bed: Plan on "winding down" before you go to sleep. Begin relaxing about 1 hour before you go to bed. Try doing a quiet activity such as listening to calming music, reading a book or meditating. 4. Turn off the TV and ALL electronics including video games, tablets, laptops, etc. 1 hour before sleep, and keep them out of the bedroom. 5. Turn off your cell phone and all notifications (new email and text alerts) or even better, leave your phone outside your room while you sleep. Studies have shown that a part of your brain continues to respond to certain lights and sounds even while you're still asleep. 6. Make your bedroom quiet, dark and cool. If you can't control the noise, try wearing earplugs or using a fan to block out other sounds. 7. Practice relaxation techniques. Try reading a book or meditating or drain your brain by writing a list of what you need to do the next  day. 8. Don't nap unless you feel sick: you'll have a better night's sleep. 9. Don't smoke, or quit if you do. Nicotine, alcohol, and marijuana can all keep you awake. Talk to your health care provider if you need help with substance use. 10. Most importantly, wake up at the same time every day (or within 1 hour of your usual wake up time) EVEN on the weekends. A regular wake up time promotes sleep hygiene and prevents sleep problems. 11. Reduce exposure to bright light in the last three hours of the day before going to sleep. Maintaining good sleep hygiene and having good sleep habits lower your risk of developing sleep problems. Getting better sleep can also improve your concentration and alertness. Try the simple steps in this guide. If you still have trouble getting enough rest, make an appointment with your health care provider.

## 2018-05-21 ENCOUNTER — Ambulatory Visit
Admission: RE | Admit: 2018-05-21 | Discharge: 2018-05-21 | Disposition: A | Discharge status: Home / Self Care | Payer: Medicare Other | Source: Ambulatory Visit | Attending: Physician Assistant | Admitting: Physician Assistant

## 2018-05-21 DIAGNOSIS — R1032 Left lower quadrant pain: Secondary | ICD-10-CM | POA: Diagnosis not present

## 2018-05-21 MED ORDER — IOPAMIDOL (ISOVUE-300) INJECTION 61%
100.0000 mL | Freq: Once | INTRAVENOUS | Status: AC | PRN
  Administered 2018-05-21: 100 mL via INTRAVENOUS

## 2018-06-03 ENCOUNTER — Other Ambulatory Visit: Payer: Self-pay | Admitting: Internal Medicine

## 2018-06-03 DIAGNOSIS — G4700 Insomnia, unspecified: Secondary | ICD-10-CM

## 2018-06-09 DIAGNOSIS — M5412 Radiculopathy, cervical region: Secondary | ICD-10-CM | POA: Diagnosis not present

## 2018-06-09 DIAGNOSIS — Z6826 Body mass index (BMI) 26.0-26.9, adult: Secondary | ICD-10-CM | POA: Diagnosis not present

## 2018-06-09 DIAGNOSIS — M542 Cervicalgia: Secondary | ICD-10-CM | POA: Diagnosis not present

## 2018-06-09 DIAGNOSIS — R030 Elevated blood-pressure reading, without diagnosis of hypertension: Secondary | ICD-10-CM | POA: Diagnosis not present

## 2018-06-09 DIAGNOSIS — M47812 Spondylosis without myelopathy or radiculopathy, cervical region: Secondary | ICD-10-CM | POA: Diagnosis not present

## 2018-06-15 DIAGNOSIS — M5011 Cervical disc disorder with radiculopathy,  high cervical region: Secondary | ICD-10-CM | POA: Diagnosis not present

## 2018-06-15 DIAGNOSIS — M5412 Radiculopathy, cervical region: Secondary | ICD-10-CM | POA: Diagnosis not present

## 2018-06-25 ENCOUNTER — Other Ambulatory Visit: Payer: Self-pay | Admitting: Internal Medicine

## 2018-06-25 DIAGNOSIS — G4700 Insomnia, unspecified: Secondary | ICD-10-CM

## 2018-06-28 DIAGNOSIS — M542 Cervicalgia: Secondary | ICD-10-CM | POA: Diagnosis not present

## 2018-06-28 DIAGNOSIS — M47812 Spondylosis without myelopathy or radiculopathy, cervical region: Secondary | ICD-10-CM | POA: Diagnosis not present

## 2018-06-28 DIAGNOSIS — M5412 Radiculopathy, cervical region: Secondary | ICD-10-CM | POA: Diagnosis not present

## 2018-07-02 ENCOUNTER — Ambulatory Visit (HOSPITAL_COMMUNITY)
Admission: RE | Admit: 2018-07-02 | Discharge: 2018-07-02 | Disposition: A | Discharge status: Home / Self Care | Payer: Medicare Other | Source: Ambulatory Visit | Attending: Adult Health | Admitting: Adult Health

## 2018-07-02 ENCOUNTER — Encounter: Payer: Self-pay | Admitting: Adult Health

## 2018-07-02 ENCOUNTER — Other Ambulatory Visit (HOSPITAL_COMMUNITY): Payer: Self-pay | Admitting: Adult Health

## 2018-07-02 ENCOUNTER — Ambulatory Visit (INDEPENDENT_AMBULATORY_CARE_PROVIDER_SITE_OTHER): Payer: Medicare Other | Admitting: Adult Health

## 2018-07-02 VITALS — BP 122/68 | HR 77 | Temp 98.6°F | Ht 63.0 in | Wt 154.0 lb

## 2018-07-02 DIAGNOSIS — M25551 Pain in right hip: Secondary | ICD-10-CM

## 2018-07-02 DIAGNOSIS — R202 Paresthesia of skin: Secondary | ICD-10-CM | POA: Diagnosis not present

## 2018-07-02 MED ORDER — PREDNISONE 20 MG PO TABS: ORAL_TABLET | ORAL | 0 refills | Status: DC

## 2018-07-02 MED ORDER — TIZANIDINE HCL 2 MG PO CAPS: 2.0000 mg | ORAL_CAPSULE | Freq: Three times a day (TID) | ORAL | 0 refills | Status: DC

## 2018-07-02 NOTE — Patient Instructions (Signed)

## 2018-07-02 NOTE — Progress Notes (Signed)
Assessment and Plan:  Susan Carr was seen today for groin pain.  Diagnoses and all orders for this visit:  Right hip pain Exam benign, XR to r/o arthritic changes though suspect tendonitis or other soft tissue etiology Dramatic improvement with rest and NSAIDs; continue aleve, trial short course of steroids, muscle relaxer at night. Patient to follow up if not improving or resolving in 2-4 weeks, will refer to ortho.  -     predniSONE (DELTASONE) 20 MG tablet; 2 tablets daily for 3 days, 1 tablet daily for 4 days. -     tizanidine (ZANAFLEX) 2 MG capsule; Take 1 capsule (2 mg total) by mouth 3 (three) times daily. -     DG HIP UNILAT WITH PELVIS 2-3 VIEWS RIGHT; Future  Further disposition pending results of labs. Discussed med's effects and SE's.   Over 15 minutes of exam, counseling, chart review, and critical decision making was performed.   Future Appointments  Date Time Provider [place]  08/25/2018  4:30 PM Unk Pinto, MD [facility] None  05/31/2019  4:15 PM Vicie Mutters, PA-C [facility] None    ------------------------------------------------------------------------------------------------------------------   HPI BP 122/68   Pulse 77   Temp 98.6 F (37 C)   Ht 5\' 3"  (1.6 m)   Wt 154 lb (69.9 kg)   SpO2 99%   BMI 27.28 kg/m   67 y.o.female with hx of arthritis presents for evaluation of R hip pain that is suddenly much worse than usual; she reports hip pain as a deep ache ongoing for 4-6 weeks, then much worse in the past week and yesterday was severe, describes pain as deep ache, 10/10 with walking, less so with just sitting/standing. She reports pain was previously intermittent, only with ambulation occasionally.   She has been taking advil, previously wasn't taking very consistently, took 2 tabs last night and this AM, reports this seems to have helped, pain is much improved today. Today she reports pain as 1/10. She denies radiating pain, back pain,  numbness/tingling, LE weakness. She does endorse intermittent burning sensation in R buttock.   She denies history of pain in this hip, denies any trauma/recent injury. No historical imaging of R knee/hip/lumbar available for review. She did have CT pelvis in 05/21/2018 which did not show notable pelvic pathology.   Past Medical History:  Diagnosis Date  . Arthritis   . Hyperlipidemia   . Labile hypertension      No Active Allergies  Current Outpatient Medications on File Prior to Visit  Medication Sig  . acyclovir (ZOVIRAX) 200 MG capsule TAKE 2 CAPSULES BY MOUTH EVERY DAY AS DIRECTED (Patient taking differently: as needed. )  . aspirin EC 81 MG tablet Take 81 mg by mouth daily.  . Cholecalciferol (VITAMIN D PO) Take 5,000 Units by mouth daily.  . ciprofloxacin (CIPRO) 500 MG tablet Take 1 tablet (500 mg total) by mouth 2 (two) times daily.  . eszopiclone (LUNESTA) 2 MG TABS tablet ONLY Take 1/2 to 1 tablet  At Bedtime ONLY if needed  . ezetimibe (ZETIA) 10 MG tablet TAKE 1 TABLET(10 MG) BY MOUTH DAILY  . fish oil-omega-3 fatty acids 1000 MG capsule Take 1 g by mouth every morning.  . fluticasone (FLONASE) 50 MCG/ACT nasal spray Place 2 sprays into both nostrils daily.  Marland Kitchen gabapentin (NEURONTIN) 100 MG capsule TAKE 1 TO 3 CAPSULES BY MOUTH AT BEDTIME  . Glycerin-Polysorbate 80 (REFRESH DRY EYE THERAPY OP) Apply 1 drop to eye daily.   . meloxicam (MOBIC) 15  MG tablet Take 1/2 to 1 tablet with supper for Pain & Inflammation  . pantoprazole (PROTONIX) 40 MG tablet Take 1 tablet (40 mg total) by mouth daily.  . ranitidine (ZANTAC) 300 MG tablet Take 1 tablet 2 x / day as needed for indigestion & heartburn  . vitamin C (ASCORBIC ACID) 500 MG tablet Take 500 mg by mouth 2 (two) times daily.  . vitamin E 100 UNIT capsule Take 100 Units by mouth every morning.   No current facility-administered medications on file prior to visit.     ROS: all negative except above.   Physical Exam:  BP  122/68   Pulse 77   Temp 98.6 F (37 C)   Ht 5\' 3"  (1.6 m)   Wt 154 lb (69.9 kg)   SpO2 99%   BMI 27.28 kg/m   General Appearance: Well nourished, in no apparent distress. Eyes: conjunctiva no swelling or erythema ENT/Mouth: Hearing normal.  Neck: Supple Respiratory: Respiratory effort normal  Cardio: RRR with no MRGs. Brisk peripheral pulses without edema.  Abdomen: Non tender, no guarding, rebound, hernias, masses. Lymphatics: Non tender without lymphadenopathy.  Musculoskeletal: Full ROM, 5/5 strength, normal gait. Lumbar back ROM intact in all spheres, no spinous tenderness, no SI joint tenderness, non-tender over greater trochanter. ROM intact to active and passive ROM through bilateral hips. She does have tenderness over R hip adductors. No notable weakness.  Skin: Warm, dry without rashes, lesions, ecchymosis.  Neuro: Normal muscle tone, Sensation intact. Bilateral patellar and achilles reflexes intact Psych: Awake and oriented X 3, normal affect, Insight and Judgment appropriate.     Susan Ribas, NP 10:36 AM [facility]

## 2018-07-16 ENCOUNTER — Encounter: Payer: Self-pay | Admitting: Adult Health

## 2018-07-16 ENCOUNTER — Ambulatory Visit (INDEPENDENT_AMBULATORY_CARE_PROVIDER_SITE_OTHER): Payer: PPO | Admitting: Adult Health

## 2018-07-16 VITALS — BP 118/70 | HR 77 | Temp 98.6°F | Ht 63.0 in | Wt 156.6 lb

## 2018-07-16 DIAGNOSIS — J029 Acute pharyngitis, unspecified: Secondary | ICD-10-CM

## 2018-07-16 MED ORDER — PROMETHAZINE-DM 6.25-15 MG/5ML PO SYRP: 5.0000 mL | ORAL_SOLUTION | Freq: Four times a day (QID) | ORAL | 1 refills | Status: DC | PRN

## 2018-07-16 MED ORDER — AZITHROMYCIN 250 MG PO TABS: ORAL_TABLET | ORAL | 1 refills | Status: AC

## 2018-07-16 MED ORDER — PREDNISONE 20 MG PO TABS: ORAL_TABLET | ORAL | 0 refills | Status: DC

## 2018-07-16 NOTE — Progress Notes (Signed)
Assessment and Plan:  Susan Carr was seen today for acute visit.  Diagnoses and all orders for this visit:  Acute pharyngitis, unspecified etiology Proceed with abx due to progressive symptoms,  Suggested symptomatic OTC remedies. Nasal saline spray for congestion. Nasal steroids, restart allergy pill, oral steroids offered Follow up as needed. -     azithromycin (ZITHROMAX) 250 MG tablet; Take 2 tablets (500 mg) on  Day 1,  followed by 1 tablet (250 mg) once daily on Days 2 through 5. -     predniSONE (DELTASONE) 20 MG tablet; 2 tablets daily for 3 days, 1 tablet daily for 4 days. -     promethazine-dextromethorphan (PROMETHAZINE-DM) 6.25-15 MG/5ML syrup; Take 5 mLs by mouth 4 (four) times daily as needed for cough.  Further disposition pending results of labs. Discussed med's effects and SE's.   Over 15 minutes of exam, counseling, chart review, and critical decision making was performed.   Future Appointments  Date Time Provider [place]  08/25/2018  4:30 PM Unk Pinto, MD [facility] None  05/31/2019  4:15 PM Vicie Mutters, PA-C [facility] None    ------------------------------------------------------------------------------------------------------------------   HPI BP 118/70   Pulse 77   Temp 98.6 F (37 C)   Ht 5\' 3"  (1.6 m)   Wt 156 lb 9.6 oz (71 kg)   SpO2 97%   BMI 27.74 kg/m   67 y.o.female presents for evaluation of URI symptoms that began 5 days ago; she reports began with mild scratchy throat that progressed to sore throat, soreness in R ear (no discharge or changes in hearing), nasal drainage, sinus/facial fullness, body aches. She denies fever or chills. She feels symptoms are progressively worsening. She is concerned about getting her husband (who has very advanced dementia) sick. She has mildly productive cough that is new in the last 1-2 days.   She has been taking sudafed, dayquil, mucinex, hot tea and doubling up on vitamin C.  She does have  seasonal allergies, typically in the spring but has not been taking allegra. She has flonase but has not been using.   Past Medical History:  Diagnosis Date  . Arthritis   . Hyperlipidemia   . Labile hypertension      No Active Allergies  Current Outpatient Medications on File Prior to Visit  Medication Sig  . acyclovir (ZOVIRAX) 200 MG capsule TAKE 2 CAPSULES BY MOUTH EVERY DAY AS DIRECTED (Patient taking differently: as needed. )  . aspirin EC 81 MG tablet Take 81 mg by mouth daily.  . Cholecalciferol (VITAMIN D PO) Take 5,000 Units by mouth daily.  . ciprofloxacin (CIPRO) 500 MG tablet Take 1 tablet (500 mg total) by mouth 2 (two) times daily.  . eszopiclone (LUNESTA) 2 MG TABS tablet ONLY Take 1/2 to 1 tablet  At Bedtime ONLY if needed  . ezetimibe (ZETIA) 10 MG tablet TAKE 1 TABLET(10 MG) BY MOUTH DAILY  . fish oil-omega-3 fatty acids 1000 MG capsule Take 1 g by mouth every morning.  . fluticasone (FLONASE) 50 MCG/ACT nasal spray Place 2 sprays into both nostrils daily.  Marland Kitchen gabapentin (NEURONTIN) 100 MG capsule TAKE 1 TO 3 CAPSULES BY MOUTH AT BEDTIME  . Glycerin-Polysorbate 80 (REFRESH DRY EYE THERAPY OP) Apply 1 drop to eye daily.   . meloxicam (MOBIC) 15 MG tablet Take 1/2 to 1 tablet with supper for Pain & Inflammation  . pantoprazole (PROTONIX) 40 MG tablet Take 1 tablet (40 mg total) by mouth daily.  . ranitidine (ZANTAC) 300 MG tablet  Take 1 tablet 2 x / day as needed for indigestion & heartburn  . tizanidine (ZANAFLEX) 2 MG capsule Take 1 capsule (2 mg total) by mouth 3 (three) times daily.  . vitamin C (ASCORBIC ACID) 500 MG tablet Take 500 mg by mouth 2 (two) times daily.  . vitamin E 100 UNIT capsule Take 100 Units by mouth every morning.   No current facility-administered medications on file prior to visit.     ROS: all negative except above.   Physical Exam:  BP 118/70   Pulse 77   Temp 98.6 F (37 C)   Ht 5\' 3"  (1.6 m)   Wt 156 lb 9.6 oz (71 kg)   SpO2 97%    BMI 27.74 kg/m   General Appearance: Well nourished, in no apparent distress. Eyes: PERRLA, EOMs, conjunctiva no swelling or erythema Sinuses: No Frontal/maxillary tenderness ENT/Mouth: Ext aud canals clear, TMs without erythema, bulging. Post pharynx with mild erythema, without notable swelling, or exudate on post pharynx.  Tonsils not notably swollen or erythematous. Hearing normal.  Neck: Supple, thyroid normal.  Respiratory: Respiratory effort normal, BS equal bilaterally without rales, rhonchi, wheezing or stridor.  Cardio: RRR with no MRGs. Brisk peripheral pulses without edema.  Abdomen: Soft, + BS.  Non tender. Lymphatics: Mild bilateral upper anterior neck tenderness without palpabe lymphadenopathy.  Musculoskeletal: Symmetrical strength, normal gait.  Skin: Warm, dry without rashes, lesions, ecchymosis.  Neuro: Cranial nerves intact. Normal muscle tone, no cerebellar symptoms. Psych: Awake and oriented X 3, normal affect, Insight and Judgment appropriate.     Izora Ribas, NP 12:21 PM [facility]

## 2018-07-16 NOTE — Patient Instructions (Signed)

## 2018-07-29 ENCOUNTER — Other Ambulatory Visit: Payer: Self-pay | Admitting: Adult Health

## 2018-07-29 DIAGNOSIS — M25551 Pain in right hip: Secondary | ICD-10-CM

## 2018-08-02 ENCOUNTER — Other Ambulatory Visit: Payer: Self-pay | Admitting: Internal Medicine

## 2018-08-02 DIAGNOSIS — G4700 Insomnia, unspecified: Secondary | ICD-10-CM

## 2018-08-02 MED ORDER — ESZOPICLONE 2 MG PO TABS: ORAL_TABLET | ORAL | 0 refills | Status: DC

## 2018-08-25 ENCOUNTER — Ambulatory Visit: Payer: Self-pay | Admitting: Internal Medicine

## 2018-08-25 NOTE — Progress Notes (Signed)
NO SHOW

## 2018-08-29 ENCOUNTER — Encounter: Payer: Self-pay | Admitting: Internal Medicine

## 2018-08-29 NOTE — Patient Instructions (Signed)
Recommend Adult Low Dose Aspirin or  coated  Aspirin 81 mg daily  To reduce risk of Colon Cancer 40%,  Skin Cancer 26 % ,  Melanoma 46%  and  Pancreatic cancer 60% +++++++++++++++++++++++++ Vitamin D goal  is between 70-100.  Please make sure that you are taking your Vitamin D as directed.  It is very important as a natural anti-inflammatory  helping hair, skin, and nails, as well as reducing stroke and heart attack risk.  It helps your bones and helps with mood. It also decreases numerous cancer risks so please take it as directed.  Low Vit D is associated with a 200-300% higher risk for CANCER  and 200-300% higher risk for HEART   ATTACK  &  STROKE.   .....................................Marland Kitchen It is also associated with higher death rate at younger ages,  autoimmune diseases like Rheumatoid arthritis, Lupus, Multiple Sclerosis.    Also many other serious conditions, like depression, Alzheimer's Dementia, infertility, muscle aches, fatigue, fibromyalgia - just to name a few. ++++++++++++++++++++ Recommend the book "The END of DIETING" by Dr Excell Seltzer  & the book "The END of DIABETES " by Dr Excell Seltzer At [email or web address] - get book & Audio CD's    Being diabetic has a  300% increased risk for heart attack, stroke, cancer, and alzheimer- type vascular dementia. It is very important that you work harder with diet by avoiding all foods that are white. Avoid white rice (brown & wild rice is OK), white potatoes (sweetpotatoes in moderation is OK), White bread or wheat bread or anything made out of white flour like bagels, donuts, rolls, buns, biscuits, cakes, pastries, cookies, pizza crust, and pasta (made from white flour & egg whites) - vegetarian pasta or spinach or wheat pasta is OK. Multigrain breads like Arnold's or Pepperidge Farm, or multigrain sandwich thins or flatbreads.  Diet, exercise and weight loss can reverse and cure diabetes in the early stages.  Diet, exercise and weight loss is  very important in the control and prevention of complications of diabetes which affects every system in your body, ie. Brain - dementia/stroke, eyes - glaucoma/blindness, heart - heart attack/heart failure, kidneys - dialysis, stomach - gastric paralysis, intestines - malabsorption, nerves - severe painful neuritis, circulation - gangrene & loss of a leg(s), and finally cancer and Alzheimers.    I recommend avoid fried & greasy foods,  sweets/candy, white rice (brown or wild rice or Quinoa is OK), white potatoes (sweet potatoes are OK) - anything made from white flour - bagels, doughnuts, rolls, buns, biscuits,white and wheat breads, pizza crust and traditional pasta made of white flour & egg white(vegetarian pasta or spinach or wheat pasta is OK).  Multi-grain bread is OK - like multi-grain flat bread or sandwich thins. Avoid alcohol in excess. Exercise is also important.    Eat all the vegetables you want - avoid meat, especially red meat and dairy - especially cheese.  Cheese is the most concentrated form of trans-fats which is the worst thing to clog up our arteries. Veggie cheese is OK which can be found in the fresh produce section at [employer] or [employer] or [employer]  +++++++++++++++++++++ DASH Eating Plan  DASH stands for "Dietary Approaches to Stop Hypertension."   The DASH eating plan is a healthy eating plan that has been shown to reduce high blood pressure (hypertension). Additional health benefits may include reducing the risk of type 2 diabetes mellitus, heart disease, and stroke. The DASH eating plan may also help  with weight loss. WHAT DO I NEED TO KNOW ABOUT THE DASH EATING PLAN? For the DASH eating plan, you will follow these general guidelines:  Choose foods with a percent daily value for sodium of less than 5% (as listed on the food label).  Use salt-free seasonings or herbs instead of table salt or sea salt.  Check with your health care provider or pharmacist before  using salt substitutes.  Eat lower-sodium products, often labeled as "lower sodium" or "no salt added."  Eat fresh foods.  Eat more vegetables, fruits, and low-fat dairy products.  Choose whole grains. Look for the word "whole" as the first word in the ingredient list.  Choose fish   Limit sweets, desserts, sugars, and sugary drinks.  Choose heart-healthy fats.  Eat veggie cheese   Eat more home-cooked food and less restaurant, buffet, and fast food.  Limit fried foods.  Cook foods using methods other than frying.  Limit canned vegetables. If you do use them, rinse them well to decrease the sodium.  When eating at a restaurant, ask that your food be prepared with less salt, or no salt if possible.                      WHAT FOODS CAN I EAT? Read Dr Fara Olden Fuhrman's books on The End of Dieting & The End of Diabetes  Grains Whole grain or whole wheat bread. Brown rice. Whole grain or whole wheat pasta. Quinoa, bulgur, and whole grain cereals. Low-sodium cereals. Corn or whole wheat flour tortillas. Whole grain cornbread. Whole grain crackers. Low-sodium crackers.  Vegetables Fresh or frozen vegetables (raw, steamed, roasted, or grilled). Low-sodium or reduced-sodium tomato and vegetable juices. Low-sodium or reduced-sodium tomato sauce and paste. Low-sodium or reduced-sodium canned vegetables.   Fruits All fresh, canned (in natural juice), or frozen fruits.  Protein Products  All fish and seafood.  Dried beans, peas, or lentils. Unsalted nuts and seeds. Unsalted canned beans.  Dairy Low-fat dairy products, such as skim or 1% milk, 2% or reduced-fat cheeses, low-fat ricotta or cottage cheese, or plain low-fat yogurt. Low-sodium or reduced-sodium cheeses.  Fats and Oils Tub margarines without trans fats. Light or reduced-fat mayonnaise and salad dressings (reduced sodium). Avocado. Safflower, olive, or canola oils. Natural peanut or almond butter.  Other Unsalted popcorn  and pretzels. The items listed above may not be a complete list of recommended foods or beverages. Contact your dietitian for more options.  +++++++++++++++  WHAT FOODS ARE NOT RECOMMENDED? Grains/ White flour or wheat flour White bread. White pasta. White rice. Refined cornbread. Bagels and croissants. Crackers that contain trans fat.  Vegetables  Creamed or fried vegetables. Vegetables in a . Regular canned vegetables. Regular canned tomato sauce and paste. Regular tomato and vegetable juices.  Fruits Dried fruits. Canned fruit in light or heavy syrup. Fruit juice.  Meat and Other Protein Products Meat in general - RED meat & White meat.  Fatty cuts of meat. Ribs, chicken wings, all processed meats as bacon, sausage, bologna, salami, fatback, hot dogs, bratwurst and packaged luncheon meats.  Dairy Whole or 2% milk, cream, half-and-half, and cream cheese. Whole-fat or sweetened yogurt. Full-fat cheeses or blue cheese. Non-dairy creamers and whipped toppings. Processed cheese, cheese spreads, or cheese curds.  Condiments Onion and garlic salt, seasoned salt, table salt, and sea salt. Canned and packaged gravies. Worcestershire sauce. Tartar sauce. Barbecue sauce. Teriyaki sauce. Soy sauce, including reduced sodium. Steak sauce. Fish sauce. Oyster sauce. Cocktail sauce.  Horseradish. Ketchup and mustard. Meat flavorings and tenderizers. Bouillon cubes. Hot sauce. Tabasco sauce. Marinades. Taco seasonings. Relishes.  Fats and Oils Butter, stick margarine, lard, shortening and bacon fat. Coconut, palm kernel, or palm oils. Regular salad dressings.  Pickles and olives. Salted popcorn and pretzels.  The items listed above may not be a complete list of foods and beverages to avoid.  a

## 2018-08-29 NOTE — Progress Notes (Signed)
This very nice 68 y.o. MBF presents for 6 month follow up with HTN, HLD, Pre-Diabetes and Vitamin D Deficiency. Patient's GERD is controlled with her meds & diet.      Patient is followed expectantly for labile HTN & BP has been controlled at home. Today's BP is at goal  114/72. Patient has had no complaints of any cardiac type chest pain, palpitations, dyspnea / orthopnea / PND, dizziness, claudication, or dependent edema.     Hyperlipidemia is not controlled with diet & meds. Patient denies myalgias or other med SE's. Last Lipids were not at goal:  Lab Results  Component Value Date   CHOL 193 03/22/2018   HDL 56 03/22/2018   LDLCALC 121 (H) 03/22/2018   TRIG 72 03/22/2018   CHOLHDL 3.4 03/22/2018      Also, the patient has history of PreDiabetes  (A1c 5.9% / 2014) and has had no symptoms of reactive hypoglycemia, diabetic polys, paresthesias or visual blurring.  Last A1c was not at goal: Lab Results  Component Value Date   HGBA1C 5.8 (H) 03/22/2018      Further, the patient also has history of Vitamin D Deficiency ("25" / 2008) and supplements vitamin D without any suspected side-effects. Last vitamin D was at goal: Lab Results  Component Value Date   VD25OH 59 03/22/2018   Current Outpatient Medications on File Prior to Visit  Medication Sig  . acyclovir (ZOVIRAX) 200 MG capsule TAKE 2 CAPSULES BY MOUTH EVERY DAY AS DIRECTED (Patient taking differently: as needed. )  . aspirin EC 81 MG tablet Take 81 mg by mouth daily.  . Cholecalciferol (VITAMIN D PO) Take 5,000 Units by mouth daily.  . eszopiclone (LUNESTA) 2 MG TABS tablet ONLY Take 1/2 to 1 tablet at Bedtime ONLY if needed  . ezetimibe (ZETIA) 10 MG tablet TAKE 1 TABLET(10 MG) BY MOUTH DAILY  . fish oil-omega-3 fatty acids 1000 MG capsule Take 1 g by mouth every morning.  . fluticasone (FLONASE) 50 MCG/ACT nasal spray Place 2 sprays into both nostrils daily.  Marland Kitchen gabapentin (NEURONTIN) 100 MG capsule TAKE 1 TO 3 CAPSULES  BY MOUTH AT BEDTIME  . Glycerin-Polysorbate 80 (REFRESH DRY EYE THERAPY OP) Apply 1 drop to eye daily.   . meloxicam (MOBIC) 15 MG tablet Take 1/2 to 1 tablet with supper for Pain & Inflammation  . pantoprazole (PROTONIX) 40 MG tablet Take 1 tablet (40 mg total) by mouth daily.  . ranitidine (ZANTAC) 300 MG tablet Take 1 tablet 2 x / day as needed for indigestion & heartburn  . tiZANidine (ZANAFLEX) 2 MG tablet TAKE 1 TABLET(2 MG) BY MOUTH THREE TIMES DAILY  . vitamin C (ASCORBIC ACID) 500 MG tablet Take 500 mg by mouth 2 (two) times daily.  . vitamin E 100 UNIT capsule Take 100 Units by mouth every morning.   No current facility-administered medications on file prior to visit.    No Known Allergies PMHx:   Past Medical History:  Diagnosis Date  . Arthritis   . Hyperlipidemia   . Labile hypertension    Immunization History  Administered Date(s) Administered  . Tdap 08/16/2012   Past Surgical History:  Procedure Laterality Date  . NECK SURGERY    . REDUCTION MAMMAPLASTY Bilateral 2007   FHx:    Reviewed / unchanged  SHx:    Reviewed / unchanged   Systems Review:  Constitutional: Denies fever, chills, wt changes, headaches, insomnia, fatigue, night sweats, change in appetite. Eyes:  Denies redness, blurred vision, diplopia, discharge, itchy, watery eyes.  ENT: Denies discharge, congestion, post nasal drip, epistaxis, sore throat, earache, hearing loss, dental pain, tinnitus, vertigo, sinus pain, snoring.  CV: Denies chest pain, palpitations, irregular heartbeat, syncope, dyspnea, diaphoresis, orthopnea, PND, claudication or edema. Respiratory: denies cough, dyspnea, DOE, pleurisy, hoarseness, laryngitis, wheezing.  Gastrointestinal: Denies dysphagia, odynophagia, heartburn, reflux, water brash, abdominal pain or cramps, nausea, vomiting, bloating, diarrhea, constipation, hematemesis, melena, hematochezia  or hemorrhoids. Genitourinary: Denies dysuria, frequency, urgency,  nocturia, hesitancy, discharge, hematuria or flank pain. Musculoskeletal: Denies arthralgias, myalgias, stiffness, jt. swelling, pain, limping or strain/sprain.  Skin: Denies pruritus, rash, hives, warts, acne, eczema or change in skin lesion(s). Neuro: No weakness, tremor, incoordination, spasms, paresthesia or pain. Psychiatric: Denies confusion, memory loss or sensory loss. Endo: Denies change in weight, skin or hair change.  Heme/Lymph: No excessive bleeding, bruising or enlarged lymph nodes.  Physical Exam  BP 114/72   Pulse 64   Temp 97.6 F (36.4 C)   Resp 16   Ht 5\' 4"  (1.626 m)   Wt 156 lb (70.8 kg)   BMI 26.78 kg/m   Appears  well nourished, well groomed  and in no distress.  Eyes: PERRLA, EOMs, conjunctiva no swelling or erythema. Sinuses: No frontal/maxillary tenderness ENT/Mouth: EAC's clear, TM's nl w/o erythema, bulging. Nares clear w/o erythema, swelling, exudates. Oropharynx clear without erythema or exudates. Oral hygiene is good. Tongue normal, non obstructing. Hearing intact.  Neck: Supple. Thyroid not palpable. Car 2+/2+ without bruits, nodes or JVD. Chest: Respirations nl with BS clear & equal w/o rales, rhonchi, wheezing or stridor.  Cor: Heart sounds normal w/ regular rate and rhythm without sig. murmurs, gallops, clicks or rubs. Peripheral pulses normal and equal  without edema.  Abdomen: Soft & bowel sounds normal. Non-tender w/o guarding, rebound, hernias, masses or organomegaly.  Lymphatics: Unremarkable.  Musculoskeletal: Full ROM all peripheral extremities, joint stability, 5/5 strength and normal gait.  Skin: Warm, dry without exposed rashes, lesions or ecchymosis apparent.  Neuro: Cranial nerves intact, reflexes equal bilaterally. Sensory-motor testing grossly intact. Tendon reflexes grossly intact.  Pysch: Alert & oriented x 3.  Insight and judgement nl & appropriate. No ideations.  Assessment and Plan:  1. Labile hypertension  - Continue  medication, monitor blood pressure at home.  - Continue DASH diet.  Reminder to go to the ER if any CP,  SOB, nausea, dizziness, severe HA, changes vision/speech.  - CBC with Differential/Platelet - COMPLETE METABOLIC PANEL WITH GFR - Magnesium - TSH  2. Hyperlipidemia, mixed  - Continue diet/meds, exercise,& lifestyle modifications.  - Continue monitor periodic cholesterol/liver & renal functions   - Lipid panel - TSH  3. Abnormal glucose  - Continue diet, exercise  - Lifestyle modifications.  - Monitor appropriate labs.  - Hemoglobin A1c - Insulin, random  4. Vitamin D deficiency  - Continue supplementation.  - VITAMIN D 25 Hydroxyl  5. Gastroesophageal reflux disease  - CBC with Differential/Platelet  6. Prediabetes  - Hemoglobin A1c - Insulin, random  7. Medication management  - CBC with Differential/Platelet - COMPLETE METABOLIC PANEL WITH GFR - Magnesium - Lipid panel - TSH - Hemoglobin A1c - Insulin, random - VITAMIN D 25 Hydroxyl        Discussed  regular exercise, BP monitoring, weight control to achieve/maintain BMI less than 25 and discussed med and SE's. Recommended labs to assess and monitor clinical status with further disposition pending results of labs. Over 30 minutes of exam, counseling, chart  review was performed.

## 2018-08-30 ENCOUNTER — Ambulatory Visit (INDEPENDENT_AMBULATORY_CARE_PROVIDER_SITE_OTHER): Payer: PPO | Admitting: Internal Medicine

## 2018-08-30 VITALS — BP 114/72 | HR 64 | Temp 97.6°F | Resp 16 | Ht 64.0 in | Wt 156.0 lb

## 2018-08-30 DIAGNOSIS — E559 Vitamin D deficiency, unspecified: Secondary | ICD-10-CM | POA: Diagnosis not present

## 2018-08-30 DIAGNOSIS — R0989 Other specified symptoms and signs involving the circulatory and respiratory systems: Secondary | ICD-10-CM | POA: Diagnosis not present

## 2018-08-30 DIAGNOSIS — Z79899 Other long term (current) drug therapy: Secondary | ICD-10-CM | POA: Diagnosis not present

## 2018-08-30 DIAGNOSIS — R7303 Prediabetes: Secondary | ICD-10-CM

## 2018-08-30 DIAGNOSIS — E782 Mixed hyperlipidemia: Secondary | ICD-10-CM | POA: Diagnosis not present

## 2018-08-30 DIAGNOSIS — K219 Gastro-esophageal reflux disease without esophagitis: Secondary | ICD-10-CM | POA: Diagnosis not present

## 2018-08-30 DIAGNOSIS — R7309 Other abnormal glucose: Secondary | ICD-10-CM | POA: Diagnosis not present

## 2018-08-31 ENCOUNTER — Other Ambulatory Visit: Payer: Self-pay | Admitting: Internal Medicine

## 2018-08-31 DIAGNOSIS — E782 Mixed hyperlipidemia: Secondary | ICD-10-CM

## 2018-08-31 LAB — LIPID PANEL
CHOL/HDL RATIO: 4.3 (calc) (ref <5.0)
Cholesterol: 264 mg/dL — ABNORMAL HIGH (ref <200)
HDL: 61 mg/dL (ref >=50)
LDL Cholesterol (Calc): 169 mg/dL (calc) — ABNORMAL HIGH
Non-HDL Cholesterol (Calc): 203 mg/dL (calc) — ABNORMAL HIGH (ref <130)
Triglycerides: 183 mg/dL — ABNORMAL HIGH (ref <150)

## 2018-08-31 LAB — VITAMIN D 25 HYDROXY (VIT D DEFICIENCY, FRACTURES): Vit D, 25-Hydroxy: 53 ng/mL (ref 30–100)

## 2018-08-31 LAB — HEMOGLOBIN A1C
Hgb A1c MFr Bld: 5.8 % of total Hgb — ABNORMAL HIGH (ref <5.7)
Mean Plasma Glucose: 120 (calc)
eAG (mmol/L): 6.6 (calc)

## 2018-08-31 LAB — CBC WITH DIFFERENTIAL/PLATELET
Absolute Monocytes: 310 cells/uL (ref 200–950)
Basophils Absolute: 30 cells/uL (ref 0–200)
Basophils Relative: 0.7 %
Eosinophils Absolute: 189 cells/uL (ref 15–500)
Eosinophils Relative: 4.4 %
HCT: 39.6 % (ref 35.0–45.0)
Hemoglobin: 13.4 g/dL (ref 11.7–15.5)
Lymphs Abs: 1595 cells/uL (ref 850–3900)
MCH: 32 pg (ref 27.0–33.0)
MCHC: 33.8 g/dL (ref 32.0–36.0)
MCV: 94.5 fL (ref 80.0–100.0)
MPV: 10.4 fL (ref 7.5–12.5)
Monocytes Relative: 7.2 %
Neutro Abs: 2176 cells/uL (ref 1500–7800)
Neutrophils Relative %: 50.6 %
Platelets: 259 10*3/uL (ref 140–400)
RBC: 4.19 10*6/uL (ref 3.80–5.10)
RDW: 12.5 % (ref 11.0–15.0)
Total Lymphocyte: 37.1 %
WBC: 4.3 10*3/uL (ref 3.8–10.8)

## 2018-08-31 LAB — COMPLETE METABOLIC PANEL WITH GFR
AG Ratio: 1.8 (calc) (ref 1.0–2.5)
ALT: 12 U/L (ref 6–29)
AST: 19 U/L (ref 10–35)
Albumin: 4.2 g/dL (ref 3.6–5.1)
Alkaline phosphatase (APISO): 72 U/L (ref 37–153)
BUN: 13 mg/dL (ref 7–25)
CO2: 33 mmol/L — ABNORMAL HIGH (ref 20–32)
CREATININE: 0.81 mg/dL (ref 0.50–0.99)
Calcium: 9.6 mg/dL (ref 8.6–10.4)
Chloride: 107 mmol/L (ref 98–110)
GFR, EST NON AFRICAN AMERICAN: 75 mL/min/{1.73_m2} (ref >=60)
GFR, Est African American: 87 mL/min/{1.73_m2} (ref >=60)
Globulin: 2.4 g/dL (calc) (ref 1.9–3.7)
Glucose, Bld: 83 mg/dL (ref 65–99)
Potassium: 3.8 mmol/L (ref 3.5–5.3)
Sodium: 144 mmol/L (ref 135–146)
Total Bilirubin: 0.5 mg/dL (ref 0.2–1.2)
Total Protein: 6.6 g/dL (ref 6.1–8.1)

## 2018-08-31 LAB — TSH: TSH: 0.35 mIU/L — ABNORMAL LOW (ref 0.40–4.50)

## 2018-08-31 LAB — INSULIN, RANDOM: Insulin: 12.3 u[IU]/mL

## 2018-08-31 LAB — MAGNESIUM: Magnesium: 2.2 mg/dL (ref 1.5–2.5)

## 2018-08-31 MED ORDER — ROSUVASTATIN CALCIUM 40 MG PO TABS: ORAL_TABLET | ORAL | 1 refills | Status: DC

## 2018-09-01 ENCOUNTER — Other Ambulatory Visit: Payer: Self-pay | Admitting: Internal Medicine

## 2018-09-01 DIAGNOSIS — G4700 Insomnia, unspecified: Secondary | ICD-10-CM

## 2018-09-01 MED ORDER — ESZOPICLONE 2 MG PO TABS: ORAL_TABLET | ORAL | 0 refills | Status: DC

## 2018-09-13 ENCOUNTER — Other Ambulatory Visit: Payer: Self-pay | Admitting: Adult Health

## 2018-09-17 ENCOUNTER — Ambulatory Visit: Payer: Self-pay | Admitting: Internal Medicine

## 2018-09-30 ENCOUNTER — Other Ambulatory Visit: Payer: Self-pay | Admitting: Internal Medicine

## 2018-09-30 DIAGNOSIS — G4700 Insomnia, unspecified: Secondary | ICD-10-CM

## 2018-09-30 MED ORDER — ESZOPICLONE 2 MG PO TABS: ORAL_TABLET | ORAL | 0 refills | Status: DC

## 2018-10-21 ENCOUNTER — Encounter: Payer: Self-pay | Admitting: Internal Medicine

## 2018-10-21 ENCOUNTER — Other Ambulatory Visit: Payer: Self-pay

## 2018-10-21 ENCOUNTER — Ambulatory Visit: Payer: PPO | Admitting: Internal Medicine

## 2018-10-21 DIAGNOSIS — J0140 Acute pansinusitis, unspecified: Secondary | ICD-10-CM | POA: Diagnosis not present

## 2018-10-21 MED ORDER — DOXYCYCLINE HYCLATE 100 MG PO CAPS: ORAL_CAPSULE | ORAL | 0 refills | Status: DC

## 2018-10-21 MED ORDER — PREDNISONE 20 MG PO TABS: ORAL_TABLET | ORAL | 0 refills | Status: DC

## 2018-10-21 NOTE — Progress Notes (Signed)
THIS ENCOUNTER IS A VIRTUAL VISIT DUE TO COVID-19 - PATIENT WAS NOT SEEN IN THE OFFICE.  PATIENT HAS CONSENTED TO VIRTUAL VISIT / TELEMEDICINE VISIT   Virtual Visit via telephone Note  I connected with Renaye Rakers on 10/21/2018 10/21/2018  by telephone.  I verified that I am speaking with the correct person using two identifiers.    I discussed the limitations of evaluation and management by telemedicine and the availability of in person appointments. The patient expressed understanding and agreed to proceed.  History of Present Illness:  Patient is a very nice 68 yo MBF with with labile HTN, HLD, Pre-Diabetes, GERD and Vitamin D Deficiency who presents with c/o 1 week prodrome of postnasal drainage & S/T. Has been trying Mucinex & Benadryl to no avail. Now for the last 2 days has developed pain over the Rt cheek & medial   Canthal area along the Rt proximal nasal bridge. She's also developed some intermittent bleeding from the Rt nostril. Denies fevers, chills, sweats, rash or dyspnea.   Medications .  ezetimibe (ZETIA) 10 MG tablet, TAKE 1 TABLET DAILY .  rosuvastatin (CRESTOR) 40 MG tablet, Take 1 tablet daily for Cholesterol .  aspirin EC 81 MG tablet, Take  daily. .  meloxicam (MOBIC) 15 MG tablet, Take 1/2 to 1 tablet with supper for Pain & Inflammation Current Outpatient Medications (Other):  .  acyclovir (ZOVIRAX) 200 MG capsule, TAKE 2 CAPSULES  EVERY DAY AS DIRECTED (Patient taking differently: as needed. ) . VITAMIN D Take 5,000 Units by mouth daily. .  eszopiclone (LUNESTA) 2 MG TABS tablet, Take 1/2-1 tablet at Bedtime  .  gabapentin (NEURONTIN) 100 MG capsule, TAKE 1 TO 3 CAPSULESAT BEDTIME .  REFRESH DRY EYE THERAPY , Apply 1 drop to eye daily.  .  pantoprazole  40 MG tablet, Take 1 tablet  daily. .  Ranitidine 300 MG tablet, Take 1 tablet 2 x / day as needed for indigestion & heartburn .  tiZANidine  2 MG tablet, TAKE 1 TABLET THREE TIMES DAILY .  vitamin C  500 MG  tablet, Take ] 2 (two) times daily. .  vitamin E 100 UNIT capsule, Take] every morning. .  fish oil-omega-3 fatty acids 1000 MG capsule, Take ] every morning.  Problem list She has Hyperlipidemia; Prediabetes; Vitamin D deficiency; Insomnia; Anxiety state; Medication management; Hypertension; Osteoporosis; GERD (gastroesophageal reflux disease); and Overweight (BMI 25.0-29.9) on their problem list.   Observations/Objective: General : Well sounding patient in no apparent distress HEENT: Slight hoarseness, no cough for duration of visit Lungs: speaks in complete sentences, no audible wheezing, no apparent distress Neurological: alert, oriented x 3 Psychiatric: pleasant, judgement appropriate   Assessment and Plan:  1. Subacute pansinusitis  - predniSONE (DELTASONE) 20 MG tablet; 1 tab 3 x day for 3 days, then 1 tab 2 x day for 3 days, then 1 tab 1 x day for 5 days  Dispense: 20 tablet; Refill: 0  - doxycycline (VIBRAMYCIN) 100 MG capsule; Take 1 capsule 2 x /day with food for 5 days , then 1 x /day with food  for 10 days  Dispense: 20 capsule; Refill: 0  Follow Up Instructions: - Discussed humidification techniques for the home.   ============================================================================================  I discussed the assessment and treatment plan with the patient. The patient was provided an opportunity to ask questions and all were answered. The patient agreed with the plan and demonstrated an understanding of the instructions.   The patient was advised to  call back or seek an in-person evaluation if the symptoms worsen or if the condition fails to improve as anticipated.  I provided  16 minutes of non-face-to-face time during this encounter.  Kirtland Bouchard, MD

## 2018-10-30 ENCOUNTER — Other Ambulatory Visit: Payer: Self-pay | Admitting: Internal Medicine

## 2018-10-30 DIAGNOSIS — M791 Myalgia, unspecified site: Secondary | ICD-10-CM

## 2018-11-11 ENCOUNTER — Other Ambulatory Visit: Payer: Self-pay | Admitting: Internal Medicine

## 2018-11-11 DIAGNOSIS — G4700 Insomnia, unspecified: Secondary | ICD-10-CM

## 2018-12-14 NOTE — Progress Notes (Signed)
Medicare wellness and 3 month follow up  Assessment and Plan:  Encounter for Medicare annual wellness exam 1 year  Essential hypertension - continue medications, DASH diet, exercise and monitor at home. Call if greater than 130/80.   Gastroesophageal reflux disease, esophagitis presence not specified Continue PPI/H2 blocker, diet discussed  Vitamin D deficiency Continue supplement  Medication management  Age-related osteoporosis without current pathological fracture Off fosamax, declines DEXA, states too busy with husband at this time  Hyperlipidemia, unspecified hyperlipidemia type check lipids - WILL ADD ON CRESTOR 5MG  TO THE ZETIA, STATES CRESTOR 40 TOO MUCH AND SHE COULD NOT BREAK IN HALF decrease fatty foods increase activity.   Abnormal glucose Discussed disease progression and risks Discussed diet/exercise, weight management and risk modification  Insomnia, unspecified type Does not take sedating meds due to her husband Consider zoloft- will think about it  Anxiety state Declines meds at this time Discussed personal care, caregiver fatigue, , improved with hospice, etc  Overweight (BMI 25.0-29.9) - long discussion about weight loss, diet, and exercise -recommended diet heavy in fruits and veggies and low in animal meats, cheeses, and dairy products  Costochondritis -     meloxicam (MOBIC) 15 MG tablet; Take one daily with food for 2 weeks, can take with tylenol, can not take with aleve, iburpofen, then as needed daily for pain   Discussed med's effects and SE's. Screening labs and tests as requested with regular follow-up as recommended. Over 40 minutes of exam, counseling, chart review and critical decision making was performed  Future Appointments  Date Time Provider [place]  05/31/2019  4:15 PM Vicie Mutters, PA-C [id number] None     During the course of the visit the patient was educated and counseled about appropriate screening and  preventive services including:    Pneumococcal vaccine   Prevnar 13  Influenza vaccine  Td vaccine  Screening electrocardiogram  Bone densitometry screening  Colorectal cancer screening  Diabetes screening  Glaucoma screening  Nutrition counseling   Advanced directives: requested  HPI  68 y.o. female  presents for wellness visit and 3 month follow up for HTN, chol, preDM, and obesity.   She is right handed, has right shoulder pain x 2 weeks. Worse with movement, anterior pain, using heating pad at night. No injury but she will move Fritz Pickerel her husband, he is in respite care this week, he is in hospice. No neck pain. No numbness/tingling weakness in her hands.   Her blood pressure has been controlled at home, states up from rushing, today their BP is BP: 140/80.  She does not workout but active at job. She denies chest pain, shortness of breath, dizziness.    She is on cholesterol medication, JUST zetia, was suppose to be on crestor  But did not start it. Her cholesterol is at goal. The cholesterol last visit was:   Lab Results  Component Value Date   CHOL 264 (H) 08/30/2018   HDL 61 08/30/2018   LDLCALC 169 (H) 08/30/2018   TRIG 183 (H) 08/30/2018   CHOLHDL 4.3 08/30/2018  . She has been working on diet and exercise for prediabetes, she is on bASA, she is on ACE/ARB and denies foot ulcerations, hyperglycemia, hypoglycemia , increased appetite, nausea, paresthesia of the feet, polydipsia, polyuria, visual disturbances, vomiting and weight loss. Last A1C in the office was:  Lab Results  Component Value Date   HGBA1C 5.8 (H) 08/30/2018  .  Patient is on Vitamin D supplement.   Lab Results  Component Value Date   VD25OH 53 08/30/2018     BMI is Body mass index is 27.46 kg/m., she is working on diet and exercise. Wt Readings from Last 3 Encounters:  12/15/18 160 lb (72.6 kg)  08/30/18 156 lb (70.8 kg)  07/16/18 156 lb 9.6 oz (71 kg)    Current Medications:   Current Outpatient Medications on File Prior to Visit  Medication Sig Dispense Refill  . acyclovir (ZOVIRAX) 200 MG capsule TAKE 2 CAPSULES BY MOUTH EVERY DAY AS DIRECTED (Patient taking differently: as needed. ) 180 capsule 3  . aspirin EC 81 MG tablet Take 81 mg by mouth daily.    . Cholecalciferol (VITAMIN D PO) Take 5,000 Units by mouth daily.    Marland Kitchen ezetimibe (ZETIA) 10 MG tablet TAKE 1 TABLET(10 MG) BY MOUTH DAILY 90 tablet 1  . fish oil-omega-3 fatty acids 1000 MG capsule Take 1 g by mouth every morning.    . fluticasone (FLONASE) 50 MCG/ACT nasal spray Place 2 sprays into both nostrils daily. 16 g 0  . gabapentin (NEURONTIN) 100 MG capsule TAKE 1 TO 3 CAPSULES BY MOUTH AT BEDTIME 240 capsule 0  . Glycerin-Polysorbate 80 (REFRESH DRY EYE THERAPY OP) Apply 1 drop to eye daily.     . pantoprazole (PROTONIX) 40 MG tablet Take 1 tablet (40 mg total) by mouth daily. 30 tablet 3  . ranitidine (ZANTAC) 300 MG tablet Take 1 tablet 2 x / day as needed for indigestion & heartburn 180 tablet 3  . tiZANidine (ZANAFLEX) 2 MG tablet TAKE 1 TABLET(2 MG) BY MOUTH THREE TIMES DAILY 60 tablet 2  . vitamin C (ASCORBIC ACID) 500 MG tablet Take 500 mg by mouth 2 (two) times daily.    . vitamin E 100 UNIT capsule Take 100 Units by mouth every morning.     No current facility-administered medications on file prior to visit.     Health Maintenance:   Immunization History  Administered Date(s) Administered  . Tdap 08/16/2012   Tetanus: 2014 Pneumovax: Declined Zostavax: Declined Influenza declines  MGM: 12/2017 DEXA: Declined cologuard 03/04/2016 + Colonoscopy 06/12/2017 Last Eye Exam:  Dr. Truman Hayward Sleep study 04/2014  Patient Care Team: Unk Pinto, MD as PCP - General (Internal Medicine) Barbaraann Cao, [place] as Referring Physician (Optometry) Clarene Essex, MD as Consulting Physician (Gastroenterology) Christophe Louis, MD as Consulting Physician (Obstetrics and Gynecology) Melrose Nakayama, MD  as Consulting Physician (Orthopedic Surgery) Dorna Leitz, MD as Consulting Physician (Orthopedic Surgery)  Medical History:  Past Medical History:  Diagnosis Date  . Arthritis   . Hyperlipidemia   . Labile hypertension    Allergies No Known Allergies  SURGICAL HISTORY She  has a past surgical history that includes Neck surgery and Reduction mammaplasty (Bilateral, 2007). FAMILY HISTORY Her family history includes Diabetes in her mother and another family member; Heart attack in her father and mother; Multiple sclerosis in an other family member. SOCIAL HISTORY She  reports that she has never smoked. She has never used smokeless tobacco. She reports that she does not drink alcohol or use drugs.  MEDICARE WELLNESS OBJECTIVES: Physical activity: Current Exercise Habits: The patient does not participate in regular exercise at present;The patient has a physically strenuous job, but has no regular exercise apart from work. Cardiac risk factors: Cardiac Risk Factors include: advanced age (>31men, >75 women);dyslipidemia;hypertension;sedentary lifestyle Depression/mood screen:   Depression screen [facility] 2/9 12/15/2018  Decreased Interest 0  Down, Depressed, Hopeless 0  PHQ - 2 Score 0  ADLs:  In your present state of health, do you have any difficulty performing the following activities: 12/15/2018 08/29/2018  Hearing? N N  Vision? N N  Difficulty concentrating or making decisions? N N  Walking or climbing stairs? N N  Dressing or bathing? N N  Doing errands, shopping? N N  Some recent data might be hidden     Cognitive Testing  Alert? Yes  Normal Appearance?Yes  Oriented to person? Yes  Place? Yes   Time? Yes  Recall of three objects?  Yes  Can perform simple calculations? Yes  Displays appropriate judgment?Yes  Can read the correct time from a watch face?Yes  EOL planning: Does Patient Have a Medical Advance Directive?: No Would patient like information on creating a medical  advance directive?: Yes (MAU/Ambulatory/Procedural Areas - Information given)  Review of Systems: Review of Systems  Constitutional: Negative for chills, fever and malaise/fatigue.  HENT: Negative for congestion, ear pain and sore throat.   Eyes: Negative.   Respiratory: Negative for cough, shortness of breath and wheezing.   Cardiovascular: Negative for chest pain, palpitations and leg swelling.  Gastrointestinal: Negative for abdominal pain, blood in stool, constipation, diarrhea, heartburn, melena, nausea and vomiting.  Genitourinary: Negative.   Skin: Negative.   Neurological: Negative for dizziness, sensory change, loss of consciousness and headaches.  Psychiatric/Behavioral: Negative for depression. The patient is not nervous/anxious and does not have insomnia.     Physical Exam: Estimated body mass index is 27.46 kg/m as calculated from the following:   Height as of 08/30/18: 5\' 4"  (1.626 m).   Weight as of this encounter: 160 lb (72.6 kg). BP 140/80   Pulse 83   Temp 98.2 F (36.8 C)   Wt 160 lb (72.6 kg)   SpO2 97%   BMI 27.46 kg/m   General Appearance: Well nourished well developed, in no apparent distress.  Eyes: PERRLA, EOMs, conjunctiva no swelling or erythema ENT/Mouth: Ear canals normal without obstruction, swelling, erythema, or discharge.  TMs normal bilaterally with no erythema, bulging, retraction, or loss of landmark.  Oropharynx moist and clear with no exudate, erythema, or swelling.   Neck: Supple, thyroid normal. No bruits.  No cervical adenopathy Respiratory: Respiratory effort normal, Breath sounds clear A&P without wheeze, rhonchi, rales.   Cardio: RRR without murmurs, rubs or gallops. Brisk peripheral pulses without edema.  Chest: + RIGHT CHEST TENDERNESS TO PALPATION, symmetric, with normal excursions Abdomen: Soft, non tender no guarding,  hernias, masses, or organomegaly.  Lymphatics: Non tender without lymphadenopathy.  Musculoskeletal: Full ROM  all peripheral extremities,5/5 strength, and normal gait.  Skin: Warm, dry without rashes, lesions, ecchymosis. Neuro: Awake and oriented X 3, Cranial nerves intact, reflexes equal bilaterally. Normal muscle tone, no cerebellar symptoms. Sensation intact.  Psych:  normal affect, Insight and Judgment appropriate.    Medicare Attestation I have personally reviewed: The patient's medical and social history Their use of alcohol, tobacco or illicit drugs Their current medications and supplements The patient's functional ability including ADLs,fall risks, home safety risks, cognitive, and hearing and visual impairment Diet and physical activities Evidence for depression or mood disorders  The patient's weight, height, BMI, and visual acuity have been recorded in the chart.  I have made referrals, counseling, and provided education to the patient based on review of the above and I have provided the patient with a written personalized care plan for preventive services.    Vicie Mutters 5:04 PM [facility]

## 2018-12-15 ENCOUNTER — Ambulatory Visit (INDEPENDENT_AMBULATORY_CARE_PROVIDER_SITE_OTHER): Payer: PPO | Admitting: Physician Assistant

## 2018-12-15 ENCOUNTER — Encounter: Payer: Self-pay | Admitting: Physician Assistant

## 2018-12-15 ENCOUNTER — Other Ambulatory Visit: Payer: Self-pay

## 2018-12-15 ENCOUNTER — Other Ambulatory Visit: Payer: Self-pay | Admitting: Physician Assistant

## 2018-12-15 VITALS — BP 140/80 | HR 83 | Temp 98.2°F | Wt 160.0 lb

## 2018-12-15 DIAGNOSIS — E663 Overweight: Secondary | ICD-10-CM

## 2018-12-15 DIAGNOSIS — Z0000 Encounter for general adult medical examination without abnormal findings: Secondary | ICD-10-CM

## 2018-12-15 DIAGNOSIS — M810 Age-related osteoporosis without current pathological fracture: Secondary | ICD-10-CM | POA: Diagnosis not present

## 2018-12-15 DIAGNOSIS — Z79899 Other long term (current) drug therapy: Secondary | ICD-10-CM

## 2018-12-15 DIAGNOSIS — Z0001 Encounter for general adult medical examination with abnormal findings: Secondary | ICD-10-CM

## 2018-12-15 DIAGNOSIS — I10 Essential (primary) hypertension: Secondary | ICD-10-CM

## 2018-12-15 DIAGNOSIS — E785 Hyperlipidemia, unspecified: Secondary | ICD-10-CM | POA: Diagnosis not present

## 2018-12-15 DIAGNOSIS — F411 Generalized anxiety disorder: Secondary | ICD-10-CM | POA: Diagnosis not present

## 2018-12-15 DIAGNOSIS — G4700 Insomnia, unspecified: Secondary | ICD-10-CM | POA: Diagnosis not present

## 2018-12-15 DIAGNOSIS — R6889 Other general symptoms and signs: Secondary | ICD-10-CM | POA: Diagnosis not present

## 2018-12-15 DIAGNOSIS — R7309 Other abnormal glucose: Secondary | ICD-10-CM

## 2018-12-15 DIAGNOSIS — K219 Gastro-esophageal reflux disease without esophagitis: Secondary | ICD-10-CM

## 2018-12-15 DIAGNOSIS — E559 Vitamin D deficiency, unspecified: Secondary | ICD-10-CM | POA: Diagnosis not present

## 2018-12-15 DIAGNOSIS — M940 Chondrocostal junction syndrome [Tietze]: Secondary | ICD-10-CM

## 2018-12-15 MED ORDER — ROSUVASTATIN CALCIUM 5 MG PO TABS: 5.0000 mg | ORAL_TABLET | Freq: Every day | ORAL | 11 refills | Status: DC

## 2018-12-15 MED ORDER — MELOXICAM 15 MG PO TABS: ORAL_TABLET | ORAL | 1 refills | Status: DC

## 2018-12-15 MED ORDER — ESZOPICLONE 2 MG PO TABS: ORAL_TABLET | ORAL | 5 refills | Status: DC

## 2018-12-15 NOTE — Patient Instructions (Addendum)
Your LDL could improve, ideally we want it under a 100.  Your cholesterol last visit was 169.    Your LDL is the bad cholesterol that can lead to heart attack and stroke. To lower your number you can decrease your fatty foods, red meat, cheese, milk and increase fiber like whole grains and veggies. You can also add a fiber supplement like Citracel or Benefiber, these do not cause gas and bloating and are safe to use. Especially if you have a strong family history of heart disease or stroke or you have evidence of plaque on any imaging like a chest xray, we may discuss at your next office visit putting you on a medication to get your number below 100.   Take the crestor 5 mg every day OR 3 days a week  Mobic is an antiinflammatory It helps pain, can not take with aleve, or ibuprofen You can take tylenol (500mg ) or tylenol arthritis (650mg ) with the meloxicam/antiinflammatories. The max you can take of tylenol a day is 3000mg  daily, this is a max of 6 pills a day of the regular tyelnol (500mg ) or a max of 4 a day of the tylenol arthritis (650mg ) as long as no other medications you are taking contain tylenol.   Mobic can cause inflammation in your stomach and can cause ulcers or bleeding, this will look like black tarry stools Make sure you take your mobic with food Try not to take it daily, take AS needed Can take with pepcid  You can do salonpas patches from the pharmacy over the counter  Costochondritis  Costochondritis is swelling and irritation (inflammation) of the tissue (cartilage) that connects your ribs to your breastbone (sternum). This causes pain in the front of your chest. The pain usually starts gradually and involves more than one rib. What are the causes? The exact cause of this condition is not always known. It results from stress on the cartilage where your ribs attach to your sternum. The cause of this stress could be:  Chest injury (trauma).  Exercise or activity, such as  lifting.  Severe coughing. What increases the risk? You may be at higher risk for this condition if you:  Are female.  Are 40?68 years old.  Recently started a new exercise or work activity.  Have low levels of vitamin D.  Have a condition that makes you cough frequently. What are the signs or symptoms? The main symptom of this condition is chest pain. The pain:  Usually starts gradually and can be sharp or dull.  Gets worse with deep breathing, coughing, or exercise.  Gets better with rest.  May be worse when you press on the sternum-rib connection (tenderness). How is this diagnosed? This condition is diagnosed based on your symptoms, medical history, and a physical exam. Your health care provider will check for tenderness when pressing on your sternum. This is the most important finding. You may also have tests to rule out other causes of chest pain. These may include:  A chest X-ray to check for lung problems.  An electrocardiogram (ECG) to see if you have a heart problem that could be causing the pain.  An imaging scan to rule out a chest or rib fracture. How is this treated? This condition usually goes away on its own over time. Your health care provider may prescribe an NSAID to reduce pain and inflammation. Your health care provider may also suggest that you:  Rest and avoid activities that make pain worse.  Apply heat or cold to the area to reduce pain and inflammation.  Do exercises to stretch your chest muscles. If these treatments do not help, your health care provider may inject a numbing medicine at the sternum-rib connection to help relieve the pain. Follow these instructions at home:  Avoid activities that make pain worse. This includes any activities that use chest, abdominal, and side muscles.  If directed, put ice on the painful area: ? Put ice in a plastic bag. ? Place a towel between your skin and the bag. ? Leave the ice on for 20 minutes, 2-3  times a day.  If directed, apply heat to the affected area as often as told by your health care provider. Use the heat source that your health care provider recommends, such as a moist heat pack or a heating pad. ? Place a towel between your skin and the heat source. ? Leave the heat on for 20-30 minutes. ? Remove the heat if your skin turns bright red. This is especially important if you are unable to feel pain, heat, or cold. You may have a greater risk of getting burned.  Take over-the-counter and prescription medicines only as told by your health care provider.  Return to your normal activities as told by your health care provider. Ask your health care provider what activities are safe for you.  Keep all follow-up visits as told by your health care provider. This is important. Contact a health care provider if:  You have chills or a fever.  Your pain does not go away or it gets worse.  You have a cough that does not go away (is persistent). Get help right away if:  You have shortness of breath. This information is not intended to replace advice given to you by your health care provider. Make sure you discuss any questions you have with your health care provider. Document Released: 04/09/2005 Document Revised: 04/01/2017 Document Reviewed: 10/24/2015 Elsevier Interactive Patient Education  [employer].

## 2018-12-16 LAB — CBC WITH DIFFERENTIAL/PLATELET
Absolute Monocytes: 400 cells/uL (ref 200–950)
Basophils Absolute: 30 cells/uL (ref 0–200)
Basophils Relative: 0.6 %
Eosinophils Absolute: 200 cells/uL (ref 15–500)
Eosinophils Relative: 4 %
HCT: 40.1 % (ref 35.0–45.0)
Hemoglobin: 13.2 g/dL (ref 11.7–15.5)
Lymphs Abs: 1780 cells/uL (ref 850–3900)
MCH: 31.1 pg (ref 27.0–33.0)
MCHC: 32.9 g/dL (ref 32.0–36.0)
MCV: 94.4 fL (ref 80.0–100.0)
MPV: 9.9 fL (ref 7.5–12.5)
Monocytes Relative: 8 %
Neutro Abs: 2590 cells/uL (ref 1500–7800)
Neutrophils Relative %: 51.8 %
Platelets: 245 10*3/uL (ref 140–400)
RBC: 4.25 10*6/uL (ref 3.80–5.10)
RDW: 12.1 % (ref 11.0–15.0)
Total Lymphocyte: 35.6 %
WBC: 5 10*3/uL (ref 3.8–10.8)

## 2018-12-16 LAB — COMPLETE METABOLIC PANEL WITH GFR
AG Ratio: 1.6 (calc) (ref 1.0–2.5)
ALT: 12 U/L (ref 6–29)
AST: 18 U/L (ref 10–35)
Albumin: 4.1 g/dL (ref 3.6–5.1)
Alkaline phosphatase (APISO): 54 U/L (ref 37–153)
BUN/Creatinine Ratio: 13 (calc) (ref 6–22)
BUN: 13 mg/dL (ref 7–25)
CO2: 27 mmol/L (ref 20–32)
Calcium: 9.6 mg/dL (ref 8.6–10.4)
Chloride: 106 mmol/L (ref 98–110)
Creat: 1.01 mg/dL — ABNORMAL HIGH (ref 0.50–0.99)
GFR, Est African American: 66 mL/min/{1.73_m2} (ref >=60)
GFR, Est Non African American: 57 mL/min/{1.73_m2} — ABNORMAL LOW (ref >=60)
Globulin: 2.5 g/dL (calc) (ref 1.9–3.7)
Glucose, Bld: 90 mg/dL (ref 65–99)
Potassium: 4.1 mmol/L (ref 3.5–5.3)
Sodium: 142 mmol/L (ref 135–146)
Total Bilirubin: 0.6 mg/dL (ref 0.2–1.2)
Total Protein: 6.6 g/dL (ref 6.1–8.1)

## 2018-12-16 LAB — LIPID PANEL
Cholesterol: 228 mg/dL — ABNORMAL HIGH (ref <200)
HDL: 60 mg/dL (ref >=50)
LDL Cholesterol (Calc): 136 mg/dL (calc) — ABNORMAL HIGH
Non-HDL Cholesterol (Calc): 168 mg/dL (calc) — ABNORMAL HIGH (ref <130)
Total CHOL/HDL Ratio: 3.8 (calc) (ref <5.0)
Triglycerides: 182 mg/dL — ABNORMAL HIGH (ref <150)

## 2018-12-16 LAB — MAGNESIUM: Magnesium: 2.2 mg/dL (ref 1.5–2.5)

## 2019-01-11 ENCOUNTER — Other Ambulatory Visit: Payer: Self-pay | Admitting: Physician Assistant

## 2019-01-11 DIAGNOSIS — M940 Chondrocostal junction syndrome [Tietze]: Secondary | ICD-10-CM

## 2019-01-27 ENCOUNTER — Other Ambulatory Visit: Payer: Self-pay

## 2019-01-27 ENCOUNTER — Ambulatory Visit (INDEPENDENT_AMBULATORY_CARE_PROVIDER_SITE_OTHER): Payer: PPO | Admitting: Physician Assistant

## 2019-01-27 ENCOUNTER — Encounter: Payer: Self-pay | Admitting: Physician Assistant

## 2019-01-27 VITALS — BP 134/76 | HR 78 | Temp 97.9°F | Ht 64.0 in | Wt 155.0 lb

## 2019-01-27 DIAGNOSIS — M79604 Pain in right leg: Secondary | ICD-10-CM | POA: Diagnosis not present

## 2019-01-27 NOTE — Patient Instructions (Signed)
INFORMATION ABOUT YOUR XRAY  Can walk into [address] for an Insurance account manager. They will have the order and take you back. You do not any paper work, I should get the result back today or tomorrow. This order is good for a year.  Can call [phone] to schedule an appointment if you wish.    Will get ultrasound too   VARICOSE VEINS Varicose veins are veins that have become enlarged and twisted. CAUSES This condition is the result of valves in the veins not working properly. Valves in the veins help return blood from the leg to the heart. When your calf muscles squeeze, the blood moves up your leg then the valves close and this continues until the blood gets back to your heart.  If these valves are damaged, blood flows backwards and backs up into the veins in the leg near the skin OR if your are sitting/standing for a long time without using your calf muscles the blood will back up into the veins in your legs. This causes the veins to become larger. People who are on their feet a lot, sit a lot without walking (like on a plane, at a desk, or in a car), who are pregnant, or who are overweight are more likely to develop varicose veins. SYMPTOMS   Bulging, twisted-appearing, bluish veins, most commonly found on the legs.  Leg pain or a feeling of heaviness. These symptoms may be worse at the end of the day.  Leg swelling.  Skin color changes. DIAGNOSIS  Varicose veins can usually be diagnosed with an exam of your legs by your caregiver. He or she may recommend an ultrasound of your leg veins. TREATMENT  Most varicose veins can be treated at home. However, other treatments are available for people who have persistent symptoms or who want to treat the cosmetic appearance of the varicose veins. But this is only cosmetic and they will return if not properly treated. These include:  Laser treatment of very small varicose veins.  Medicine that is shot (injected) into the vein. This medicine  hardens the walls of the vein and closes off the vein. This treatment is called sclerotherapy. Afterwards, you may need to wear clothing or bandages that apply pressure.  Surgery. HOME CARE INSTRUCTIONS   Do not stand or sit in one position for long periods of time. Do not sit with your legs crossed. Rest with your legs raised during the day.  Your legs have to be higher than your heart so that gravity will force the valves to open, so please really elevate your legs.   Wear elastic stockings or support hose. Do not wear other tight, encircling garments around the legs, pelvis, or waist.  [employer]  has a wide variety of well priced compression stockings. [place], [place] [address] [phone]  OR THERE ARE COPPER INFUSED COMPRESSION SOCKS AT [facility] OR [facility]  [employer] also has great cheap/afforable stockings or socks- the socks are easier to get on your feet  - can also get a jacob's donner that helps you put on the sock  Walk as much as possible to increase blood flow.  Raise the foot of your bed at night with 2-inch blocks.  If you get a cut in the skin over the vein and the vein bleeds, lie down with your leg raised and press on it with a clean cloth until the bleeding stops. Then place a bandage (dressing) on the cut. See your  caregiver if it continues to bleed or needs stitches. SEEK MEDICAL CARE IF:   The skin around your ankle starts to break down.  You have pain, redness, tenderness, or hard swelling developing in your leg over a vein.  You are uncomfortable due to leg pain. Document Released: 04/09/2005 Document Revised: 09/22/2011 Document Reviewed: 08/26/2010 [facility] Patient Information 2014 [place].

## 2019-01-27 NOTE — Progress Notes (Signed)
Subjective:    Patient ID: Susan Carr, female    DOB: Feb 06, 1951, 68 y.o.   MRN: [id number]  HPI 68 y.o. AAF with right lateral ankle pain x at least a month.  It is worse at night and will go up her anterior leg/calf. She states it will feel weak, burning pain. Will put ace bandage on to walk 1-2 miles at night to help prevent swelling. She denies back pain.  States she did have an injury to her right heel several months ago.  No recent ABX like levaquin.   Blood pressure 134/76, pulse 78, temperature 97.9 F (36.6 C), height 5\' 4"  (1.626 m), weight 155 lb (70.3 kg), SpO2 98 %.  Medications Current Outpatient Medications on File Prior to Visit  Medication Sig  . acyclovir (ZOVIRAX) 200 MG capsule TAKE 2 CAPSULES BY MOUTH EVERY DAY AS DIRECTED (Patient taking differently: as needed. )  . aspirin EC 81 MG tablet Take 81 mg by mouth daily.  . Cholecalciferol (VITAMIN D PO) Take 5,000 Units by mouth daily.  . eszopiclone (LUNESTA) 2 MG TABS tablet Take 1/2-1 tablet 2 at Bedtime ONLY if needed &  limit to 5 days /week to avoid addiction  . ezetimibe (ZETIA) 10 MG tablet TAKE 1 TABLET(10 MG) BY MOUTH DAILY  . fish oil-omega-3 fatty acids 1000 MG capsule Take 1 g by mouth every morning.  . fluticasone (FLONASE) 50 MCG/ACT nasal spray Place 2 sprays into both nostrils daily.  Marland Kitchen gabapentin (NEURONTIN) 100 MG capsule TAKE 1 TO 3 CAPSULES BY MOUTH AT BEDTIME  . Glycerin-Polysorbate 80 (REFRESH DRY EYE THERAPY OP) Apply 1 drop to eye daily.   . meloxicam (MOBIC) 15 MG tablet Take 1/2 to 1 tablet Daily with Food for Pain & Inflammation & timit to 4 to 5 days  /week to avoid Kidney Damage  . pantoprazole (PROTONIX) 40 MG tablet Take 1 tablet (40 mg total) by mouth daily.  . ranitidine (ZANTAC) 300 MG tablet Take 1 tablet 2 x / day as needed for indigestion & heartburn  . rosuvastatin (CRESTOR) 5 MG tablet Take 1 tablet (5 mg total) by mouth at bedtime.  Marland Kitchen tiZANidine (ZANAFLEX) 2 MG tablet  TAKE 1 TABLET(2 MG) BY MOUTH THREE TIMES DAILY  . vitamin C (ASCORBIC ACID) 500 MG tablet Take 500 mg by mouth 2 (two) times daily.  . vitamin E 100 UNIT capsule Take 100 Units by mouth every morning.   No current facility-administered medications on file prior to visit.     Problem list She has Hyperlipidemia; Abnormal glucose; Vitamin D deficiency; Insomnia; Anxiety state; Medication management; Hypertension; Osteoporosis; GERD (gastroesophageal reflux disease); and Overweight (BMI 25.0-29.9) on their problem list.   Review of Systems  Constitutional: Negative.  Negative for chills and fever.  HENT: Negative.   Respiratory: Negative.  Negative for chest tightness and shortness of breath.   Cardiovascular: Positive for leg swelling. Negative for chest pain and palpitations.  Gastrointestinal: Negative.   Genitourinary: Negative.   Musculoskeletal: Positive for arthralgias, joint swelling (right knee) and myalgias (right leg pain, worse at night, burning sensation, pain to palpation). Negative for back pain and gait problem.  Skin: Negative.   Neurological: Negative.   Hematological: Negative.   Psychiatric/Behavioral: Negative.        Objective:   Physical Exam Constitutional:      Appearance: She is well-developed.  Cardiovascular:     Rate and Rhythm: Normal rate and regular rhythm.  Pulmonary:  Effort: Pulmonary effort is normal.     Breath sounds: Normal breath sounds.  Abdominal:     General: Bowel sounds are normal.     Palpations: Abdomen is soft.  Musculoskeletal: Normal range of motion.     Comments: Tender right SI joint, negative straight leg + varicose veins bilateral legs, worse right leg. She has negative squeeze test to foot. Negative homen's. + tenderness right lateral calf, some swelling right leg compared to left leg. Good sensation, good pulses right leg.   Skin:    General: Skin is warm and dry.     Findings: No rash.  Neurological:     Mental  Status: She is alert and oriented to person, place, and time.        Assessment & Plan:  Huxley was seen today for acute visit.  Diagnoses and all orders for this visit:  Right leg pain -     DG Knee Complete 4 Views Right; Future -     DG Tibia/Fibula Right; Future -     VAS [place] LOWER EXTREMITY VENOUS (DVT); Future  Seems more like varicose veins/venous insuff, will rule out DVT.  Get xray with night pain ? OA from knee versus SI pain but other than pain with palpation normal back exam. Continue gabapentin Wear compression socks  The patient was advised to call immediately if she has any concerning symptoms in the interval. The patient voices understanding of current treatment options and is in agreement with the current care plan.The patient knows to call the clinic with any problems, questions or concerns or go to the ER if any further progression of symptoms.

## 2019-01-28 ENCOUNTER — Telehealth (HOSPITAL_COMMUNITY): Payer: Self-pay | Admitting: *Deleted

## 2019-01-28 ENCOUNTER — Ambulatory Visit (HOSPITAL_COMMUNITY)
Admission: RE | Admit: 2019-01-28 | Discharge: 2019-01-28 | Disposition: A | Discharge status: Home / Self Care | Payer: PPO | Source: Ambulatory Visit | Attending: Physician Assistant | Admitting: Physician Assistant

## 2019-01-28 ENCOUNTER — Ambulatory Visit
Admission: RE | Admit: 2019-01-28 | Discharge: 2019-01-28 | Disposition: A | Discharge status: Home / Self Care | Payer: PPO | Source: Ambulatory Visit | Attending: Physician Assistant | Admitting: Physician Assistant

## 2019-01-28 DIAGNOSIS — M25561 Pain in right knee: Secondary | ICD-10-CM | POA: Diagnosis not present

## 2019-01-28 DIAGNOSIS — M79604 Pain in right leg: Secondary | ICD-10-CM | POA: Diagnosis not present

## 2019-01-28 NOTE — Telephone Encounter (Signed)
The above patient or their representative was contacted and gave the following answers to these questions:         Do you have any of the following symptoms?  no  Fever                    Cough                   Shortness of breath  Do  you have any of the following other symptoms? no   muscle pain         vomiting,        diarrhea        rash         weakness        red eye        abdominal pain         bruising          bruising or bleeding              joint pain           severe headache    Have you been in contact with someone who was or has been sick in the past 2 weeks?  no  Yes                 Unsure                         Unable to assess   Does the person that you were in contact with have any of the following symptoms?   Cough         shortness of breath           muscle pain         vomiting,            diarrhea            rash            weakness           fever            red eye           abdominal pain           bruising  or  bleeding                joint pain                severe headache               Have you  or someone you have been in contact with traveled internationally in th last month?   no      If yes, which countries?   Have you  or someone you have been in contact with traveled outside [place] in th last month?   no      If yes, which state and city?   COMMENTS OR ACTION PLAN FOR THIS PATIENT:          

## 2019-02-23 ENCOUNTER — Other Ambulatory Visit: Payer: Self-pay | Admitting: Internal Medicine

## 2019-03-14 ENCOUNTER — Other Ambulatory Visit: Payer: Self-pay | Admitting: Internal Medicine

## 2019-03-14 MED ORDER — GABAPENTIN 100 MG PO CAPS: ORAL_CAPSULE | ORAL | 1 refills | Status: DC

## 2019-03-22 ENCOUNTER — Ambulatory Visit (INDEPENDENT_AMBULATORY_CARE_PROVIDER_SITE_OTHER): Payer: PPO | Admitting: Internal Medicine

## 2019-03-22 ENCOUNTER — Other Ambulatory Visit: Payer: Self-pay | Admitting: Internal Medicine

## 2019-03-22 ENCOUNTER — Other Ambulatory Visit: Payer: Self-pay

## 2019-03-22 VITALS — BP 126/72 | HR 84 | Temp 97.7°F | Resp 16 | Ht 64.0 in | Wt 157.2 lb

## 2019-03-22 DIAGNOSIS — R7309 Other abnormal glucose: Secondary | ICD-10-CM

## 2019-03-22 DIAGNOSIS — E782 Mixed hyperlipidemia: Secondary | ICD-10-CM

## 2019-03-22 DIAGNOSIS — E559 Vitamin D deficiency, unspecified: Secondary | ICD-10-CM

## 2019-03-22 DIAGNOSIS — I10 Essential (primary) hypertension: Secondary | ICD-10-CM

## 2019-03-22 DIAGNOSIS — Z79899 Other long term (current) drug therapy: Secondary | ICD-10-CM

## 2019-03-22 DIAGNOSIS — R7303 Prediabetes: Secondary | ICD-10-CM | POA: Diagnosis not present

## 2019-03-22 NOTE — Progress Notes (Signed)
History of Present Illness:      This very nice 68 y.o. WBF  presents for 3 month follow up with HTN, HLD, Pre-Diabetes and Vitamin D Deficiency. Patient's GERD is controlled with her meds & diet.       Patient is  Followed expectantly with labile HTN & BP has been controlled at home. Today's BP is at goal - 126/72. Patient has had no complaints of any cardiac type chest pain, palpitations, dyspnea / orthopnea / PND, dizziness, claudication, or dependent edema.      Hyperlipidemia is not controlled with diet as she is not taking her prescribed Rosuvastatin / Crestor. Patient denies myalgias or other med SE's. Current Lipids are not at goal: Lab Results  Component Value Date   CHOL 212 (H) 03/22/2019   HDL 60 03/22/2019   LDLCALC 130 (H) 03/22/2019   TRIG 108 03/22/2019   CHOLHDL 3.5 03/22/2019       Also, the patient has history of PreDiabetes (A1c 5.9% / 2014)  and has had no symptoms of reactive hypoglycemia, diabetic polys, paresthesias or visual blurring.  Current A1c is  at goal: Lab Results  Component Value Date   HGBA1C 5.6 03/22/2019       Further, the patient also has history of Vitamin D Deficiency ("25" / 2008)  and supplements vitamin D without any suspected side-effects. Last vitamin D was  Still slightly low (goal 70-100): Lab Results  Component Value Date   VD25OH 58 03/22/2019   Current Outpatient Medications on File Prior to Visit  Medication Sig  . acyclovir (ZOVIRAX) 200 MG capsule TAKE 2 CAPSULES BY MOUTH EVERY DAY AS DIRECTED (Patient taking differently: as needed. )  . aspirin EC 81 MG tablet Take 81 mg by mouth daily.  . Cholecalciferol (VITAMIN D PO) Take 5,000 Units by mouth daily.  . eszopiclone (LUNESTA) 2 MG TABS tablet Take 1/2-1 tablet 2 at Bedtime ONLY if needed &  limit to 5 days /week to avoid addiction  . ezetimibe (ZETIA) 10 MG tablet Take 1 tablet Daily for Cholesterol  . fish oil-omega-3 fatty acids 1000 MG capsule Take 1 g by mouth every  morning.  . fluticasone (FLONASE) 50 MCG/ACT nasal spray Place 2 sprays into both nostrils daily.  Marland Kitchen gabapentin (NEURONTIN) 100 MG capsule Take 1 to 3 capsules 1 to 2 hours before Bedtime as needed for Sleep  . Glycerin-Polysorbate 80 (REFRESH DRY EYE THERAPY OP) Apply 1 drop to eye daily.   . pantoprazole (PROTONIX) 40 MG tablet Take 1 tablet (40 mg total) by mouth daily.  . vitamin C (ASCORBIC ACID) 500 MG tablet Take 500 mg by mouth 2 (two) times daily.  . vitamin E 100 UNIT capsule Take 100 Units by mouth every morning.  . rosuvastatin (CRESTOR) 5 MG tablet Take 1 tablet (5 mg total) by mouth at bedtime. (Patient not taking: Reported on 03/22/2019)   No current facility-administered medications on file prior to visit.    No Known Allergies  PMHx:   Past Medical History:  Diagnosis Date  . Arthritis   . Hyperlipidemia   . Labile hypertension    Immunization History  Administered Date(s) Administered  . Tdap 08/16/2012   Past Surgical History:  Procedure Laterality Date  . NECK SURGERY    . REDUCTION MAMMAPLASTY Bilateral 2007   FHx:    Reviewed / unchanged  SHx:    Reviewed / unchanged   Systems Review:  Constitutional: Denies fever, chills,  wt changes, headaches, insomnia, fatigue, night sweats, change in appetite. Eyes: Denies redness, blurred vision, diplopia, discharge, itchy, watery eyes.  ENT: Denies discharge, congestion, post nasal drip, epistaxis, sore throat, earache, hearing loss, dental pain, tinnitus, vertigo, sinus pain, snoring.  CV: Denies chest pain, palpitations, irregular heartbeat, syncope, dyspnea, diaphoresis, orthopnea, PND, claudication or edema. Respiratory: denies cough, dyspnea, DOE, pleurisy, hoarseness, laryngitis, wheezing.  Gastrointestinal: Denies dysphagia, odynophagia, heartburn, reflux, water brash, abdominal pain or cramps, nausea, vomiting, bloating, diarrhea, constipation, hematemesis, melena, hematochezia  or hemorrhoids. Genitourinary:  Denies dysuria, frequency, urgency, nocturia, hesitancy, discharge, hematuria or flank pain. Musculoskeletal: Denies arthralgias, myalgias, stiffness, jt. swelling, pain, limping or strain/sprain.  Skin: Denies pruritus, rash, hives, warts, acne, eczema or change in skin lesion(s). Neuro: No weakness, tremor, incoordination, spasms, paresthesia or pain. Psychiatric: Denies confusion, memory loss or sensory loss. Endo: Denies change in weight, skin or hair change.  Heme/Lymph: No excessive bleeding, bruising or enlarged lymph nodes.  Physical Exam  BP 126/72   Pulse 84   Temp 97.7 F (36.5 C)   Resp 16   Ht 5\' 4"  (1.626 m)   Wt 157 lb 3.2 oz (71.3 kg)   BMI 26.98 kg/m   Appears  well nourished, well groomed  and in no distress.  Eyes: PERRLA, EOMs, conjunctiva no swelling or erythema. Sinuses: No frontal/maxillary tenderness ENT/Mouth: EAC's clear, TM's nl w/o erythema, bulging. Nares clear w/o erythema, swelling, exudates. Oropharynx clear without erythema or exudates. Oral hygiene is good. Tongue normal, non obstructing. Hearing intact.  Neck: Supple. Thyroid not palpable. Car 2+/2+ without bruits, nodes or JVD. Chest: Respirations nl with BS clear & equal w/o rales, rhonchi, wheezing or stridor.  Cor: Heart sounds normal w/ regular rate and rhythm without sig. murmurs, gallops, clicks or rubs. Peripheral pulses normal and equal  without edema.  Abdomen: Soft & bowel sounds normal. Non-tender w/o guarding, rebound, hernias, masses or organomegaly.  Lymphatics: Unremarkable.  Musculoskeletal: Full ROM all peripheral extremities, joint stability, 5/5 strength and normal gait.  Skin: Warm, dry without exposed rashes, lesions or ecchymosis apparent.  Neuro: Cranial nerves intact, reflexes equal bilaterally. Sensory-motor testing grossly intact. Tendon reflexes grossly intact.  Pysch: Alert & oriented x 3.  Insight and judgement nl & appropriate. No ideations.  Assessment and Plan:   1. Essential hypertension  - Continue medication, monitor blood pressure at home.  - Continue DASH diet.  Reminder to go to the ER if any CP,  SOB, nausea, dizziness, severe HA, changes vision/speech.  - CBC with Differential/Platelet - COMPLETE METABOLIC PANEL WITH GFR - Magnesium - TSH  2. Hyperlipidemia, mixed  - Continue diet/meds, exercise,& lifestyle modifications.  - Continue monitor periodic cholesterol/liver & renal functions   - Lipid panel - TSH  3. Abnormal glucose  - Continue diet, exercise  - Lifestyle modifications.  - Monitor appropriate labs.  - Hemoglobin A1c - Insulin, random  4. Vitamin D deficiency  - Continue supplementation.  - VITAMIN D 25 Hydroxyl  5. Prediabetes  - Hemoglobin A1c - Insulin, random  6. Medication management  - CBC with Differential/Platelet - COMPLETE METABOLIC PANEL WITH GFR - Magnesium - Lipid panel - TSH - Hemoglobin A1c - Insulin, random - VITAMIN D 25 Hydroxyl        Discussed  regular exercise, BP monitoring, weight control to achieve/maintain BMI less than 25 and discussed med and SE's. Recommended labs to assess and monitor clinical status with further disposition pending results of labs.  I discussed the  assessment and treatment plan with the patient. The patient was provided an opportunity to ask questions and all were answered. The patient agreed with the plan and demonstrated an understanding of the instructions.  I provided over 30 minutes of exam, counseling, chart review and  complex critical decision making.  Kirtland Bouchard, MD

## 2019-03-22 NOTE — Patient Instructions (Signed)

## 2019-03-23 ENCOUNTER — Encounter: Payer: Self-pay | Admitting: *Deleted

## 2019-03-24 LAB — LIPID PANEL
Cholesterol: 212 mg/dL — ABNORMAL HIGH (ref <200)
HDL: 60 mg/dL
LDL Cholesterol (Calc): 130 mg/dL (calc) — ABNORMAL HIGH
Non-HDL Cholesterol (Calc): 152 mg/dL (calc) — ABNORMAL HIGH (ref <130)
Total CHOL/HDL Ratio: 3.5 (calc) (ref <5.0)
Triglycerides: 108 mg/dL (ref <150)

## 2019-03-24 LAB — HEMOGLOBIN A1C
Hgb A1c MFr Bld: 5.6 % of total Hgb (ref <5.7)
Mean Plasma Glucose: 114 (calc)
eAG (mmol/L): 6.3 (calc)

## 2019-03-24 LAB — COMPLETE METABOLIC PANEL WITH GFR
AG Ratio: 1.5 (calc) (ref 1.0–2.5)
ALT: 11 U/L
AST: 20 U/L (ref 10–35)
Albumin: 4.2 g/dL (ref 3.6–5.1)
Alkaline phosphatase (APISO): 49 U/L
BUN: 14 mg/dL (ref 7–25)
CO2: 28 mmol/L (ref 20–32)
Calcium: 9.5 mg/dL
Chloride: 105 mmol/L (ref 98–110)
Creat: 0.9 mg/dL
Globulin: 2.8 g/dL (calc) (ref 1.9–3.7)
Glucose, Bld: 90 mg/dL (ref 65–99)
Potassium: 4.3 mmol/L (ref 3.5–5.3)
Sodium: 142 mmol/L (ref 135–146)
Total Bilirubin: 0.6 mg/dL (ref 0.2–1.2)
Total Protein: 7 g/dL (ref 6.1–8.1)

## 2019-03-24 LAB — CBC WITH DIFFERENTIAL/PLATELET
Absolute Monocytes: 326 cells/uL (ref 200–950)
Basophils Absolute: 38 cells/uL (ref 0–200)
Basophils Relative: 0.8 %
Eosinophils Absolute: 197 cells/uL (ref 15–500)
Eosinophils Relative: 4.1 %
HCT: 41.3 % (ref 38.5–45.0)
Hemoglobin: 13.7 g/dL (ref 13.2–15.5)
Lymphs Abs: 1666 cells/uL (ref 850–3900)
MCH: 31.5 pg (ref 27.0–33.0)
MCHC: 33.2 g/dL (ref 32.0–36.0)
MCV: 94.9 fL (ref 80.0–100.0)
MPV: 10.2 fL (ref 7.5–12.5)
Monocytes Relative: 6.8 %
Neutro Abs: 2573 cells/uL (ref 1500–7800)
Neutrophils Relative %: 53.6 %
Platelets: 270 10*3/uL (ref 140–400)
RBC: 4.35 10*6/uL (ref 4.20–5.10)
RDW: 12.2 % (ref 11.0–15.0)
Total Lymphocyte: 34.7 %
WBC: 4.8 10*3/uL (ref 3.8–10.8)

## 2019-03-24 LAB — MAGNESIUM: Magnesium: 2.2 mg/dL (ref 1.5–2.5)

## 2019-03-24 LAB — INSULIN, RANDOM

## 2019-03-24 LAB — VITAMIN D 25 HYDROXY (VIT D DEFICIENCY, FRACTURES): Vit D, 25-Hydroxy: 58 ng/mL (ref 30–100)

## 2019-03-24 LAB — TSH: TSH: 0.3 mIU/L — ABNORMAL LOW (ref 0.40–4.50)

## 2019-03-24 LAB — TIQ-MISC

## 2019-03-25 ENCOUNTER — Encounter: Payer: Self-pay | Admitting: Internal Medicine

## 2019-03-25 MED ORDER — ZINC 50 MG PO TABS: ORAL_TABLET | ORAL | 0 refills | Status: AC

## 2019-05-13 ENCOUNTER — Other Ambulatory Visit: Payer: Self-pay | Admitting: Internal Medicine

## 2019-05-13 DIAGNOSIS — B029 Zoster without complications: Secondary | ICD-10-CM

## 2019-05-31 ENCOUNTER — Ambulatory Visit: Payer: Self-pay | Admitting: Physician Assistant

## 2019-06-22 DIAGNOSIS — I700 Atherosclerosis of aorta: Secondary | ICD-10-CM | POA: Insufficient documentation

## 2019-06-22 NOTE — Progress Notes (Signed)
CPE and 3 month follow up  Assessment and Plan:  Encounter for general adult medical examination with abnormal findings 1 year Get Prevnar when available did not have in the office today  Hyperlipidemia, unspecified hyperlipidemia type -     Lipid Profile check lipids decrease fatty foods increase activity.   Abnormal glucose -     Hemoglobin A1c ([facility]) Discussed disease progression and risks Discussed diet/exercise, weight management and risk modification  Essential hypertension -     CBC with Diff -     COMPLETE METABOLIC PANEL WITH GFR -     TSH -     Urinalysis, Routine w reflex microscopic -     Microalbumin / Creatinine Urine Ratio -     EKG 12-Lead - continue medications, DASH diet, exercise and monitor at home. Call if greater than 130/80.   Medication management -     Magnesium  Vitamin D deficiency -     Vitamin D (25 hydroxy)  Overweight (BMI 25.0-29.9)  Overweight  - long discussion about weight loss, diet, and exercise -recommended diet heavy in fruits and veggies and low in animal meats, cheeses, and dairy products  Age-related osteoporosis without current pathological fracture Monitor  Gastroesophageal reflux disease, unspecified whether esophagitis present Continue PPI/H2 blocker, diet discussed  Insomnia, unspecified type Continue lunesta as needed, ? Benefit from topamax or another sleep study, had uncompleted in 2015. '  Anxiety state ? Some depression component with husband passing, states she feels lonely and decreased motivation, encouraged patient to start walking/exercising again, reach out to her family/friends, and to call if she needs anything Declines meds at this time but did discuss wellbutrin/topamax to help with motivation, weight, etc.   Aortic atherosclerosis ([place]) Via CT Control blood pressure, cholesterol, glucose, increase exercise.    Discussed med's effects and SE's. Screening labs and tests as requested with regular  follow-up as recommended. Over 40 minutes of exam, counseling, chart review and critical decision making was performed  No future appointments.  HPI  68 y.o. female  presents for CPE and 3 month follow up for HTN, chol, preDM, and obesity.   Fritz Pickerel passed, she is having some depression with this, she states she will not get to sleep until 1-2 AM, she gets out of bed at 11AM. She is on [employer], she did a sleep study a long time ago per patient but it was not accurate because she was not sleeping.   Her blood pressure has been controlled at home, states up from rushing, today their BP is BP: 130/76.  She does not workout but active at job. She denies chest pain, shortness of breath, dizziness.    BMI is Body mass index is 28.32 kg/m. She states her weight is up, she admits that she has not been walking, she has not been eating well.  Wt Readings from Last 3 Encounters:  06/23/19 165 lb (74.8 kg)  03/22/19 157 lb 3.2 oz (71.3 kg)  01/27/19 155 lb (70.3 kg)   She is on cholesterol medication, JUST zetia, was suppose to be on crestor but states even with just taking it 2 times a week she had myalgias with it.  Her cholesterol is at goal. The cholesterol last visit was:   Lab Results  Component Value Date   CHOL 212 (H) 03/22/2019   HDL 60 03/22/2019   LDLCALC 130 (H) 03/22/2019   TRIG 108 03/22/2019   CHOLHDL 3.5 03/22/2019  . She has been working on diet  and exercise for prediabetes, she is on bASA, she is on ACE/ARB and denies foot ulcerations, hyperglycemia, hypoglycemia , increased appetite, nausea, paresthesia of the feet, polydipsia, polyuria, visual disturbances, vomiting and weight loss. Last A1C in the office was:  Lab Results  Component Value Date   HGBA1C 5.6 03/22/2019  .  Patient is on Vitamin D supplement.   Lab Results  Component Value Date   VD25OH 58 03/22/2019      Current Medications:    Current Outpatient Medications (Cardiovascular):  .  ezetimibe (ZETIA)  10 MG tablet, Take 1 tablet Daily for Cholesterol .  rosuvastatin (CRESTOR) 5 MG tablet, Take 1 tablet (5 mg total) by mouth at bedtime. (Patient not taking: Reported on 06/23/2019)   Current Outpatient Medications (Analgesics):  .  aspirin EC 81 MG tablet, Take 81 mg by mouth daily.   Current Outpatient Medications (Other):  .  acyclovir (ZOVIRAX) 200 MG capsule, Take 1 capsule 2 x /day as needed for Mouth Ulcers .  Cholecalciferol (VITAMIN D PO), Take 5,000 Units by mouth daily. .  eszopiclone (LUNESTA) 2 MG TABS tablet, Take 1/2-1 tablet 2 at Bedtime ONLY if needed &  limit to 5 days /week to avoid addiction .  fish oil-omega-3 fatty acids 1000 MG capsule, Take 1 g by mouth every morning. .  Glycerin-Polysorbate 80 (REFRESH DRY EYE THERAPY OP), Apply 1 drop to eye daily.  .  pantoprazole (PROTONIX) 40 MG tablet, Take 1 tablet (40 mg total) by mouth daily. .  vitamin C (ASCORBIC ACID) 500 MG tablet, Take 500 mg by mouth 2 (two) times daily. .  vitamin E 100 UNIT capsule, Take 100 Units by mouth every morning. .  Zinc 50 MG TABS, Take 1 tablet Daily  Health Maintenance:   Immunization History  Administered Date(s) Administered  . Tdap 08/16/2012   Tetanus: 2014 Pneumovax: Declined PREVNAR 13- OUT OF IN THE OFFICE Zostavax: Declined Influenza declines  MGM: 12/2017 DEXA: Declined cologuard 03/04/2016 + Colonoscopy 06/12/2017 Last Eye Exam:  Dr. Truman Hayward Sleep study 04/2014  Patient Care Team: Unk Pinto, MD as PCP - General (Internal Medicine) Barbaraann Cao, [place] as Referring Physician (Optometry) Clarene Essex, MD as Consulting Physician (Gastroenterology) Christophe Louis, MD as Consulting Physician (Obstetrics and Gynecology) Melrose Nakayama, MD as Consulting Physician (Orthopedic Surgery) Dorna Leitz, MD as Consulting Physician (Orthopedic Surgery)  Medical History:  Past Medical History:  Diagnosis Date  . Arthritis   . Hyperlipidemia   . Labile hypertension     Allergies No Known Allergies  SURGICAL HISTORY She  has a past surgical history that includes Neck surgery and Reduction mammaplasty (Bilateral, 2007). FAMILY HISTORY Her family history includes Diabetes in her mother and another family member; Heart attack in her father and mother; Multiple sclerosis in an other family member. SOCIAL HISTORY She  reports that she has never smoked. She has never used smokeless tobacco. She reports that she does not drink alcohol or use drugs.  Review of Systems: Review of Systems  Constitutional: Negative for chills, fever and malaise/fatigue.  HENT: Negative for congestion, ear pain and sore throat.   Eyes: Negative.   Respiratory: Negative for cough, shortness of breath and wheezing.   Cardiovascular: Negative for chest pain, palpitations and leg swelling.  Gastrointestinal: Negative for abdominal pain, blood in stool, constipation, diarrhea, heartburn, melena, nausea and vomiting.  Genitourinary: Negative.   Skin: Negative.   Neurological: Negative for dizziness, sensory change, loss of consciousness and headaches.  Psychiatric/Behavioral: Negative for depression.  The patient is not nervous/anxious and does not have insomnia.     Physical Exam: Estimated body mass index is 28.32 kg/m as calculated from the following:   Height as of this encounter: 5\' 4"  (1.626 m).   Weight as of this encounter: 165 lb (74.8 kg). BP 130/76   Pulse 81   Temp (!) 97.2 F (36.2 C)   Ht 5\' 4"  (1.626 m)   Wt 165 lb (74.8 kg)   SpO2 97%   BMI 28.32 kg/m   General Appearance: Well nourished well developed, in no apparent distress.  Eyes: PERRLA, EOMs, conjunctiva no swelling or erythema ENT/Mouth: Ear canals normal without obstruction, swelling, erythema, or discharge.  TMs normal bilaterally with no erythema, bulging, retraction, or loss of landmark.  Oropharynx moist and clear with no exudate, erythema, or swelling.   Neck: Supple, thyroid normal. No  bruits.  No cervical adenopathy Respiratory: Respiratory effort normal, Breath sounds clear A&P without wheeze, rhonchi, rales.   Cardio: RRR without murmurs, rubs or gallops. Brisk peripheral pulses without edema.  Chest:symmetric, with normal excursions Abdomen: Soft, non tender no guarding,  hernias, masses, or organomegaly.  Lymphatics: Non tender without lymphadenopathy.  Musculoskeletal: Full ROM all peripheral extremities,5/5 strength, and normal gait.  Skin: Warm, dry without rashes, lesions, ecchymosis. Neuro: Awake and oriented X 3, Cranial nerves intact, reflexes equal bilaterally. Normal muscle tone, no cerebellar symptoms. Sensation intact.  Psych:  normal affect, Insight and Judgment appropriate.    Vicie Mutters 3:52 PM [facility]

## 2019-06-23 ENCOUNTER — Encounter: Payer: PPO | Admitting: Physician Assistant

## 2019-06-23 ENCOUNTER — Ambulatory Visit: Payer: Self-pay | Admitting: Physician Assistant

## 2019-06-23 ENCOUNTER — Ambulatory Visit (INDEPENDENT_AMBULATORY_CARE_PROVIDER_SITE_OTHER): Payer: PPO | Admitting: Physician Assistant

## 2019-06-23 ENCOUNTER — Other Ambulatory Visit: Payer: Self-pay

## 2019-06-23 ENCOUNTER — Encounter: Payer: Self-pay | Admitting: Physician Assistant

## 2019-06-23 VITALS — BP 130/76 | HR 81 | Temp 97.2°F | Ht 64.0 in | Wt 165.0 lb

## 2019-06-23 DIAGNOSIS — E785 Hyperlipidemia, unspecified: Secondary | ICD-10-CM | POA: Diagnosis not present

## 2019-06-23 DIAGNOSIS — Z136 Encounter for screening for cardiovascular disorders: Secondary | ICD-10-CM

## 2019-06-23 DIAGNOSIS — K219 Gastro-esophageal reflux disease without esophagitis: Secondary | ICD-10-CM

## 2019-06-23 DIAGNOSIS — Z0000 Encounter for general adult medical examination without abnormal findings: Secondary | ICD-10-CM

## 2019-06-23 DIAGNOSIS — Z79899 Other long term (current) drug therapy: Secondary | ICD-10-CM

## 2019-06-23 DIAGNOSIS — G4700 Insomnia, unspecified: Secondary | ICD-10-CM

## 2019-06-23 DIAGNOSIS — M810 Age-related osteoporosis without current pathological fracture: Secondary | ICD-10-CM

## 2019-06-23 DIAGNOSIS — Z0001 Encounter for general adult medical examination with abnormal findings: Secondary | ICD-10-CM

## 2019-06-23 DIAGNOSIS — E559 Vitamin D deficiency, unspecified: Secondary | ICD-10-CM

## 2019-06-23 DIAGNOSIS — R7309 Other abnormal glucose: Secondary | ICD-10-CM | POA: Diagnosis not present

## 2019-06-23 DIAGNOSIS — I700 Atherosclerosis of aorta: Secondary | ICD-10-CM

## 2019-06-23 DIAGNOSIS — E663 Overweight: Secondary | ICD-10-CM

## 2019-06-23 DIAGNOSIS — I10 Essential (primary) hypertension: Secondary | ICD-10-CM | POA: Diagnosis not present

## 2019-06-23 DIAGNOSIS — F411 Generalized anxiety disorder: Secondary | ICD-10-CM

## 2019-06-23 NOTE — Patient Instructions (Addendum)
FIBER SUPPLEMENT  Benefiber or Citracel is good for constipation/diarrhea/irritable bowel syndrome, it helps with weight loss and can help lower your bad cholesterol. Please do 1 TBSP in the morning in water, coffee, or tea. It can take up to a month before you can see a difference with your bowel movements. It is cheapest from [employer], [employer], [employer].    Discussed topamax as sleep/weight loss medication  Also discussed wellbutrin as depression  Try to exercise more, get a workout buddy  [place] Address: [address], [place], [place] [address]  Phone: [phone]  Nuts are a healthy fat that can help you feel full HOWEVER they pack a lot of calories in a small amount. For weight loss, I suggest no more than 1 serving of nuts a day. A handful goes a long way. Here are some examples of the different types of nuts and the suggested serving a day.     Start back to walking  General eating tips  What to Avoid . Avoid added sugars o Often added sugar can be found in processed foods such as many condiments, dry cereals, cakes, cookies, chips, crisps, crackers, candies, sweetened drinks, etc.  o Read labels and AVOID/DECREASE use of foods with the following in their ingredient list: Sugar, fructose, high fructose corn syrup, sucrose, glucose, maltose, dextrose, molasses, cane sugar, brown sugar, any type of syrup, agave nectar, etc.   . Avoid snacking in between meals- drink water or if you feel you need a snack, pick a high water content snack such as cucumbers, watermelon, or any veggie.  Marland Kitchen Avoid foods made with flour o If you are going to eat food made with flour, choose those made with whole-grains; and, minimize your consumption as much as is tolerable . Avoid processed foods o These foods are generally stocked in the middle of the grocery store.  o Focus on shopping on the perimeter of the grocery.  What to Include . Vegetables o GREEN LEAFY VEGETABLES:  Kale, spinach, mustard greens, collard greens, cabbage, broccoli, etc. o OTHER: Asparagus, cauliflower, eggplant, carrots, peas, Brussel sprouts, tomatoes, bell peppers, zucchini, beets, cucumbers, etc. . Grains, seeds, and legumes o Beans: kidney beans, black eyed peas, garbanzo beans, black beans, pinto beans, etc. o Whole, unrefined grains: brown rice, barley, bulgur, oatmeal, etc. . Healthy fats  o Avoid highly processed fats such as vegetable oil o Examples of healthy fats: avocado, olives, virgin olive oil, dark chocolate (?72% Cocoa), nuts (peanuts, almonds, walnuts, cashews, pecans, etc.) o Please still do small amount of these healthy fats, they are dense in calories.  . Low - Moderate Intake of Animal Sources of Protein o Meat sources: chicken, [place], salmon, tuna. Limit to 4 ounces of meat at one time or the size of your palm. o Consider limiting dairy sources, but when choosing dairy focus on: PLAIN [place] yogurt, cottage cheese, high-protein milk . Fruit o Choose berries   If I told you I had a single pill that would help you with everything listed below and more, would you be interested? . decrease stress by improving anxiety and depression . help you achieve a healthy weight . give you more energy . make you more productive . help you focus . decrease your risk of dementia/heart attack/stroke/falls . improve your bone health  These are just some of the benefits that exercise brings to you.   IT IS WORTH carving out some time every day to fit in exercise. It will help  in every aspect of your health. Even if you have injuries that prevent you from participating in a type of exercise you used to do; there is always something that you can do to keep exercise a part of your life. If improving your health is important, make exercise your priority. It is worth the time! If you have questions about the type of exercise that is right for you, please talk with me about  this!  EXERCISE IS MEDICINE!  Benefits of Exercise  Reduces breast cancer onset and recurrence by 50% Lowers risk of colon cancer by 66% Reduces the risk of Alzheimer's by almost 50% Reduces heart disease and high blood pressure by almost 50% Lowers risk of stroke by 33% Lowers risk of Type II Diabetes Mellitus by over 60% Treats depression as well as medication or cognitive behavioral therapy       Exercising to Stay Healthy  Exercising regularly is important. It has many health benefits, such as:  Improving your overall fitness, flexibility, and endurance.  Increasing your bone density.  Helping with weight control.  Decreasing your body fat.  Increasing your muscle strength.  Reducing stress and tension.  Improving your overall health.   In order to become healthy and stay healthy, it is recommended that you do moderate-intensity and vigorous-intensity exercise. You can tell that you are exercising at a moderate intensity if you have a higher heart rate and faster breathing, but you are still able to hold a conversation. You can tell that you are exercising at a vigorous intensity if you are breathing much harder and faster and cannot hold a conversation while exercising. How often should I exercise? Choose an activity that you enjoy and set realistic goals. Your health care provider can help you to make an activity plan that works for you. Exercise regularly as directed by your health care provider. This may include:  Doing resistance training twice each week, such as: ? Push-ups. ? Sit-ups. ? Lifting weights. ? Using resistance bands.  Doing a given intensity of exercise for a given amount of time. Choose from these options: ? 150 minutes of moderate-intensity exercise every week. ? 75 minutes of vigorous-intensity exercise every week. ? A mix of moderate-intensity and vigorous-intensity exercise every week.   Children, pregnant women, people who are out of  shape, people who are overweight, and older adults may need to consult a health care provider for individual recommendations. If you have any sort of medical condition, be sure to consult your health care provider before starting a new exercise program. What are some exercise ideas? Some moderate-intensity exercise ideas include:  Walking at a rate of 1 mile in 15 minutes.  Biking.  Hiking.  Golfing.  Dancing.   Some vigorous-intensity exercise ideas include:  Walking at a rate of at least 4.5 miles per hour.  Jogging or running at a rate of 5 miles per hour.  Biking at a rate of at least 10 miles per hour.  Lap swimming.  Roller-skating or in-line skating.  Cross-country skiing.  Vigorous competitive sports, such as football, basketball, and soccer.  Jumping rope.  Aerobic dancing.   What are some everyday activities that can help me to get exercise?  [place] work, such as: ? Pushing a Conservation officer, nature. ? Raking and bagging leaves.  Washing and waxing your car.  Pushing a stroller.  Shoveling snow.  Gardening.  Washing windows or floors. How can I be more active in my day-to-day activities?  Use the  stairs instead of the elevator.  Take a walk during your lunch break.  If you drive, park your car farther away from work or school.  If you take public transportation, get off one stop early and walk the rest of the way.  Make all of your phone calls while standing up and walking around.  Get up, stretch, and walk around every 30 minutes throughout the day. What guidelines should I follow while exercising?  Do not exercise so much that you hurt yourself, feel dizzy, or get very short of breath.  Consult your health care provider before starting a new exercise program.  Wear comfortable clothes and shoes with good support.  Drink plenty of water while you exercise to prevent dehydration or heat stroke. Body water is lost during exercise and must be  replaced.  Work out until you breathe faster and your heart beats faster. This information is not intended to replace advice given to you by your health care provider. Make sure you discuss any questions you have with your health care provider.

## 2019-06-24 LAB — COMPLETE METABOLIC PANEL WITH GFR
AG Ratio: 1.6 (calc) (ref 1.0–2.5)
ALT: 12 U/L (ref 6–29)
AST: 19 U/L (ref 10–35)
Albumin: 4.2 g/dL (ref 3.6–5.1)
Alkaline phosphatase (APISO): 75 U/L (ref 37–153)
BUN: 15 mg/dL (ref 7–25)
CO2: 28 mmol/L (ref 20–32)
Calcium: 9.8 mg/dL (ref 8.6–10.4)
Chloride: 105 mmol/L (ref 98–110)
Creat: 0.88 mg/dL (ref 0.50–0.99)
GFR, Est African American: 78 mL/min/{1.73_m2} (ref >=60)
GFR, Est Non African American: 68 mL/min/{1.73_m2} (ref >=60)
Globulin: 2.6 g/dL (calc) (ref 1.9–3.7)
Glucose, Bld: 88 mg/dL (ref 65–99)
Potassium: 4.2 mmol/L (ref 3.5–5.3)
Sodium: 141 mmol/L (ref 135–146)
Total Bilirubin: 0.5 mg/dL (ref 0.2–1.2)
Total Protein: 6.8 g/dL (ref 6.1–8.1)

## 2019-06-24 LAB — URINALYSIS, ROUTINE W REFLEX MICROSCOPIC
Bilirubin Urine: NEGATIVE
Glucose, UA: NEGATIVE
Hgb urine dipstick: NEGATIVE
Ketones, ur: NEGATIVE
Leukocytes,Ua: NEGATIVE
Nitrite: NEGATIVE
Protein, ur: NEGATIVE
Specific Gravity, Urine: 1.012 (ref 1.001–1.03)
pH: 5.5 (ref 5.0–8.0)

## 2019-06-24 LAB — LIPID PANEL
Cholesterol: 202 mg/dL — ABNORMAL HIGH (ref <200)
HDL: 59 mg/dL (ref >=50)
LDL Cholesterol (Calc): 118 mg/dL (calc) — ABNORMAL HIGH
Non-HDL Cholesterol (Calc): 143 mg/dL (calc) — ABNORMAL HIGH (ref <130)
Total CHOL/HDL Ratio: 3.4 (calc) (ref <5.0)
Triglycerides: 133 mg/dL (ref <150)

## 2019-06-24 LAB — CBC WITH DIFFERENTIAL/PLATELET
Absolute Monocytes: 354 cells/uL (ref 200–950)
Basophils Absolute: 31 cells/uL (ref 0–200)
Basophils Relative: 0.6 %
Eosinophils Absolute: 203 cells/uL (ref 15–500)
Eosinophils Relative: 3.9 %
HCT: 40.8 % (ref 35.0–45.0)
Hemoglobin: 13.5 g/dL (ref 11.7–15.5)
Lymphs Abs: 1846 cells/uL (ref 850–3900)
MCH: 31.2 pg (ref 27.0–33.0)
MCHC: 33.1 g/dL (ref 32.0–36.0)
MCV: 94.2 fL (ref 80.0–100.0)
MPV: 10.5 fL (ref 7.5–12.5)
Monocytes Relative: 6.8 %
Neutro Abs: 2766 cells/uL (ref 1500–7800)
Neutrophils Relative %: 53.2 %
Platelets: 256 10*3/uL (ref 140–400)
RBC: 4.33 10*6/uL (ref 3.80–5.10)
RDW: 12.1 % (ref 11.0–15.0)
Total Lymphocyte: 35.5 %
WBC: 5.2 10*3/uL (ref 3.8–10.8)

## 2019-06-24 LAB — TSH: TSH: 0.32 mIU/L — ABNORMAL LOW (ref 0.40–4.50)

## 2019-06-24 LAB — MICROALBUMIN / CREATININE URINE RATIO
Creatinine, Urine: 45 mg/dL (ref 20–275)
Microalb Creat Ratio: 4 mcg/mg creat (ref <30)
Microalb, Ur: 0.2 mg/dL

## 2019-06-24 LAB — VITAMIN D 25 HYDROXY (VIT D DEFICIENCY, FRACTURES): Vit D, 25-Hydroxy: 60 ng/mL (ref 30–100)

## 2019-06-24 LAB — MAGNESIUM: Magnesium: 1.9 mg/dL (ref 1.5–2.5)

## 2019-06-24 LAB — HEMOGLOBIN A1C
Hgb A1c MFr Bld: 5.9 % of total Hgb — ABNORMAL HIGH (ref <5.7)
Mean Plasma Glucose: 123 (calc)
eAG (mmol/L): 6.8 (calc)

## 2019-06-28 ENCOUNTER — Other Ambulatory Visit: Payer: Self-pay | Admitting: Physician Assistant

## 2019-06-28 DIAGNOSIS — G4700 Insomnia, unspecified: Secondary | ICD-10-CM

## 2019-08-19 ENCOUNTER — Other Ambulatory Visit: Payer: Self-pay | Admitting: Internal Medicine

## 2019-08-19 MED ORDER — DEXAMETHASONE 4 MG PO TABS: ORAL_TABLET | ORAL | 0 refills | Status: DC

## 2019-08-25 ENCOUNTER — Other Ambulatory Visit: Payer: Self-pay

## 2019-08-25 ENCOUNTER — Encounter: Payer: Self-pay | Admitting: Physician Assistant

## 2019-08-25 ENCOUNTER — Ambulatory Visit (INDEPENDENT_AMBULATORY_CARE_PROVIDER_SITE_OTHER): Payer: Medicare Other | Admitting: Physician Assistant

## 2019-08-25 VITALS — BP 140/70 | HR 62 | Temp 97.3°F | Ht 64.0 in | Wt 165.6 lb

## 2019-08-25 DIAGNOSIS — G4700 Insomnia, unspecified: Secondary | ICD-10-CM

## 2019-08-25 DIAGNOSIS — I700 Atherosclerosis of aorta: Secondary | ICD-10-CM

## 2019-08-25 DIAGNOSIS — L42 Pityriasis rosea: Secondary | ICD-10-CM

## 2019-08-25 MED ORDER — ESZOPICLONE 2 MG PO TABS: ORAL_TABLET | ORAL | 0 refills | Status: DC

## 2019-08-25 MED ORDER — TRIAMCINOLONE ACETONIDE 0.5 % EX CREA: 1.0000 "application " | TOPICAL_CREAM | Freq: Two times a day (BID) | CUTANEOUS | 2 refills | Status: DC

## 2019-08-25 NOTE — Patient Instructions (Addendum)
Pityriasis Rosea Pityriasis rosea is a rash that usually appears on the chest, abdomen, and back. It may also appear on the upper arms and upper legs. It usually begins as a single patch, and then more patches start to develop. The rash may cause mild itching, but it normally does not cause other problems. It usually goes away without treatment. However, it may take weeks or months for the rash to go away completely. What are the causes? The cause of this condition is not known. The condition does not spread from person to person (is not contagious). What increases the risk?  It is more common in the winter or spring and fall seasons. What are the signs or symptoms? The main symptom of this condition is a rash.  The rash usually begins with a single oval patch that is larger than the ones that follow. This is called a herald patch. It generally appears a week or more before the rest of the rash appears.  When more patches start to develop, they spread quickly on the chest, abdomen, back, arms, and legs. These patches are smaller than the first one.  The patches that make up the rash are usually oval-shaped and pink or red in color. They are usually flat but may sometimes be raised so that they can be felt with a finger. They may also be finely crinkled and have a scaly ring around the edge. Some people may have mild itching and nonspecific symptoms, such as:  Nausea.  Loss of appetite.  Difficulty concentrating.  Headache.  Irritability.  Sore throat.  Mild fever. How is this diagnosed? This condition may be diagnosed based on:  Your medical history and a physical exam.  Tests to rule out other causes. This may include blood tests or a test in which a small sample of skin is removed from the rash (biopsy) and checked in a lab. How is this treated?     Treatment is not usually needed for this condition. The rash will often go away on its own in 4-8 weeks. In some cases, a  health care provider may recommend or prescribe medicine to reduce itching. Follow these instructions at home:  Take or apply over-the-counter and prescription medicines only as told by your health care provider.  Avoid scratching the affected areas of skin.  Do not take hot baths or use a sauna. Use only warm water when bathing or showering. Heat can increase itching. Adding cornstarch to your bath may help to relieve the itching.  Avoid exposure to the sun and other sources of UV light, such as tanning beds, as told by your health care provider. UV light may help the rash go away but may cause unwanted changes in skin color.  Keep all follow-up visits as told by your health care provider. This is important. Contact a health care provider if:  Your rash does not go away in 8 weeks.  Your rash gets much worse.  You have a fever.  You have swelling or pain in the rash area.  You have fluid, blood, or pus coming from the rash area. Summary  Pityriasis rosea is a rash that usually appears on the trunk of the body. It can also appear on the upper arms and upper legs.  The rash usually begins with a single oval patch (herald patch) that appears a week or more before the rest of the rash appears. The herald patch is larger than the ones that follow.  The rash  may cause mild itching, but it usually does not cause other problems. It usually goes away without treatment in 4-8 weeks.  In some cases, a health care provider may recommend or prescribe medicine to reduce itching. This information is not intended to replace advice given to you by your health care provider. Make sure you discuss any questions you have with your health care provider. Document Revised: 06/29/2017 Document Reviewed: 06/29/2017 Elsevier Patient Education  2020 [employer].

## 2019-08-25 NOTE — Progress Notes (Signed)
Subjective:    Patient ID: Susan Carr, female    DOB: [birth year], 69 y.o.   MRN: [id number]  HPI 69 y.o. AAF with history of GERD, HTN, anxiety, chol, insomnia presents with rash. She was given decadron 08/19/2019. She had red bumps on neck and back, very pruritic.   Denies specific medication, food, skin care product, soap, or other environmental triggers have been identified. She did recently change to gain pods, she has switched back.  She did not experience concomitant cardiopulmonary or GI symptoms.  She has no specific nasal symptom complaints today.  BMI is Body mass index is 28.43 kg/m., she is working on diet and exercise. Wt Readings from Last 3 Encounters:  08/25/19 165 lb 9.6 oz (75.1 kg)  06/23/19 165 lb (74.8 kg)  03/22/19 157 lb 3.2 oz (71.3 kg)     Blood pressure 140/70, pulse 62, temperature (!) 97.3 F (36.3 C), height 5\' 4"  (1.626 m), weight 165 lb 9.6 oz (75.1 kg), SpO2 98 %.  Medications  Current Outpatient Medications (Endocrine & Metabolic):  .  dexamethasone (DECADRON) 4 MG tablet, Take 1 tab 3 x day - 3 days, then 2 x day - 3 days, then 1 tab daily  Current Outpatient Medications (Cardiovascular):  .  ezetimibe (ZETIA) 10 MG tablet, Take 1 tablet Daily for Cholesterol   Current Outpatient Medications (Analgesics):  .  aspirin EC 81 MG tablet, Take 81 mg by mouth daily.   Current Outpatient Medications (Other):  .  acyclovir (ZOVIRAX) 200 MG capsule, Take 1 capsule 2 x /day as needed for Mouth Ulcers .  Cholecalciferol (VITAMIN D PO), Take 5,000 Units by mouth daily. .  eszopiclone (LUNESTA) 2 MG TABS tablet, Take 1/2-1 tablet at Bedtime  ONLY if needed for Sleep &  limit to 5 days /week to avoid addiction .  fish oil-omega-3 fatty acids 1000 MG capsule, Take 1 g by mouth every morning. .  Glycerin-Polysorbate 80 (REFRESH DRY EYE THERAPY OP), Apply 1 drop to eye daily.  .  pantoprazole (PROTONIX) 40 MG tablet, Take 1 tablet (40 mg total)  by mouth daily. .  vitamin C (ASCORBIC ACID) 500 MG tablet, Take 500 mg by mouth 2 (two) times daily. .  vitamin E 100 UNIT capsule, Take 100 Units by mouth every morning. .  Zinc 50 MG TABS, Take 1 tablet Daily  Problem list She has Hyperlipidemia; Abnormal glucose; Vitamin D deficiency; Insomnia; Anxiety state; Medication management; Hypertension; Osteoporosis; GERD (gastroesophageal reflux disease); Overweight (BMI 25.0-29.9); and Aortic atherosclerosis (HCC) on their problem list.   Review of Systems     Objective:   Physical Exam Constitutional:      General: She is not in acute distress.    Appearance: Normal appearance. She is not toxic-appearing.  HENT:     Head: Normocephalic and atraumatic.     Right Ear: Tympanic membrane normal.     Left Ear: Tympanic membrane normal.  Eyes:     Conjunctiva/sclera: Conjunctivae normal.     Pupils: Pupils are equal, round, and reactive to light.  Cardiovascular:     Rate and Rhythm: Normal rate and regular rhythm.  Pulmonary:     Effort: Pulmonary effort is normal.     Breath sounds: Normal breath sounds.  Abdominal:     Palpations: Abdomen is soft.     Tenderness: There is no abdominal tenderness.  Musculoskeletal:        General: Normal range of motion.     Cervical back:  Normal range of motion and neck supple.  Skin:    General: Skin is dry.     Findings: Rash present.     Comments: Dry scaly patch on right mid back- possible harold's patch with rough papules along back  Neurological:     General: No focal deficit present.     Mental Status: She is alert and oriented to person, place, and time.  Psychiatric:        Mood and Affect: Mood normal.        Behavior: Behavior normal.          Assessment & Plan:   Pityriasis rosea -     triamcinolone cream (KENALOG) 0.5 %; Apply 1 application topically 2 (two) times daily. Stop the gain pods and switch to other tablets  Aortic atherosclerosis (HCC) Control blood  pressure, cholesterol, glucose, increase exercise.   Insomnia, unspecified type -     eszopiclone (LUNESTA) 2 MG TABS tablet; Take 1 tablet at Bedtime for sleep

## 2019-09-21 DIAGNOSIS — Z23 Encounter for immunization: Secondary | ICD-10-CM | POA: Diagnosis not present

## 2019-09-29 ENCOUNTER — Other Ambulatory Visit: Payer: Self-pay | Admitting: Physician Assistant

## 2019-09-29 DIAGNOSIS — G4700 Insomnia, unspecified: Secondary | ICD-10-CM

## 2019-10-19 DIAGNOSIS — Z23 Encounter for immunization: Secondary | ICD-10-CM | POA: Diagnosis not present

## 2019-10-24 ENCOUNTER — Other Ambulatory Visit: Payer: Self-pay | Admitting: Internal Medicine

## 2019-10-24 DIAGNOSIS — Z1231 Encounter for screening mammogram for malignant neoplasm of breast: Secondary | ICD-10-CM

## 2019-10-25 ENCOUNTER — Other Ambulatory Visit: Payer: Self-pay

## 2019-10-25 ENCOUNTER — Ambulatory Visit (INDEPENDENT_AMBULATORY_CARE_PROVIDER_SITE_OTHER): Payer: Medicare Other | Admitting: Internal Medicine

## 2019-10-25 VITALS — BP 122/74 | HR 72 | Temp 97.0°F | Resp 16 | Ht 64.0 in | Wt 167.6 lb

## 2019-10-25 DIAGNOSIS — E782 Mixed hyperlipidemia: Secondary | ICD-10-CM | POA: Diagnosis not present

## 2019-10-25 DIAGNOSIS — E559 Vitamin D deficiency, unspecified: Secondary | ICD-10-CM

## 2019-10-25 DIAGNOSIS — Z79899 Other long term (current) drug therapy: Secondary | ICD-10-CM | POA: Diagnosis not present

## 2019-10-25 DIAGNOSIS — R7309 Other abnormal glucose: Secondary | ICD-10-CM

## 2019-10-25 DIAGNOSIS — R7303 Prediabetes: Secondary | ICD-10-CM

## 2019-10-25 DIAGNOSIS — R0989 Other specified symptoms and signs involving the circulatory and respiratory systems: Secondary | ICD-10-CM | POA: Diagnosis not present

## 2019-10-25 DIAGNOSIS — K219 Gastro-esophageal reflux disease without esophagitis: Secondary | ICD-10-CM | POA: Diagnosis not present

## 2019-10-25 NOTE — Progress Notes (Signed)
History of Present Illness:       This very nice 69 y.o. WBF  presents for 3 month follow up with HTN, HLD, Pre-Diabetes and Vitamin D Deficiency.       Patient is followed expectantly for labile HTN & BP has been controlled at home. Today's BP is at goal -  122/74. Patient has had no complaints of any cardiac type chest pain, palpitations, dyspnea / orthopnea / PND, dizziness, claudication, or dependent edema.      Patient is Statin Intolerant & her Hyperlipidemia is not controlled with diet & Zetia. Patient denies myalgias or other med SE's. Last Lipids were not at goal:  Lab Results  Component Value Date   CHOL 202 (H) 06/23/2019   HDL 59 06/23/2019   LDLCALC 118 (H) 06/23/2019   TRIG 133 06/23/2019   CHOLHDL 3.4 06/23/2019    Also, the patient has history of PreDiabetes(A1c 5.9% / 2014) and has had no symptoms of reactive hypoglycemia, diabetic polys, paresthesias or visual blurring.  Last A1c was not at goal -   Lab Results  Component Value Date   HGBA1C 5.9 (H) 06/23/2019           Further, the patient also has history of Vitamin D Deficiency ("25" / 2008)  and supplements vitamin D without any suspected side-effects. Last vitamin D was at goal:  Lab Results  Component Value Date   VD25OH 60 06/23/2019    Current Outpatient Medications on File Prior to Visit  Medication Sig  . acyclovir (ZOVIRAX) 200 MG capsule Take 1 capsule 2 x /day as needed for Mouth Ulcers  . aspirin EC 81 MG tablet Take 81 mg by mouth daily.  . Cholecalciferol (VITAMIN D PO) Take 5,000 Units by mouth daily.  . eszopiclone (LUNESTA) 2 MG TABS tablet TAKE 1 TABLET BY MOUTH AT BEDTIME FOR SLEEP  . ezetimibe (ZETIA) 10 MG tablet Take 1 tablet Daily for Cholesterol  . Glycerin-Polysorbate 80 (REFRESH DRY EYE THERAPY OP) Apply 1 drop to eye daily.   . pantoprazole (PROTONIX) 40 MG tablet Take 1 tablet (40 mg total) by mouth daily.  . vitamin C (ASCORBIC ACID) 500 MG tablet Take 500 mg by  mouth 2 (two) times daily.  . vitamin E 100 UNIT capsule Take 100 Units by mouth every morning.  . Zinc 50 MG TABS Take 1 tablet Daily   No current facility-administered medications on file prior to visit.    No Known Allergies  PMHx:   Past Medical History:  Diagnosis Date  . Arthritis   . Hyperlipidemia   . Labile hypertension     Immunization History  Administered Date(s) Administered  . Tdap 08/16/2012    Past Surgical History:  Procedure Laterality Date  . NECK SURGERY    . REDUCTION MAMMAPLASTY Bilateral 2007    FHx:    Reviewed / unchanged  SHx:    Reviewed / unchanged   Systems Review:  Constitutional: Denies fever, chills, wt changes, headaches, insomnia, fatigue, night sweats, change in appetite. Eyes: Denies redness, blurred vision, diplopia, discharge, itchy, watery eyes.  ENT: Denies discharge, congestion, post nasal drip, epistaxis, sore throat, earache, hearing loss, dental pain, tinnitus, vertigo, sinus pain, snoring.  CV: Denies chest pain, palpitations, irregular heartbeat, syncope, dyspnea, diaphoresis, orthopnea, PND, claudication or edema. Respiratory: denies cough, dyspnea, DOE, pleurisy, hoarseness, laryngitis, wheezing.  Gastrointestinal: Denies dysphagia, odynophagia, heartburn, reflux, water brash, abdominal pain or cramps, nausea, vomiting, bloating, diarrhea, constipation, hematemesis,  melena, hematochezia  or hemorrhoids. Genitourinary: Denies dysuria, frequency, urgency, nocturia, hesitancy, discharge, hematuria or flank pain. Musculoskeletal: Denies arthralgias, myalgias, stiffness, jt. swelling, pain, limping or strain/sprain.  Skin: Denies pruritus, rash, hives, warts, acne, eczema or change in skin lesion(s). Neuro: No weakness, tremor, incoordination, spasms, paresthesia or pain. Psychiatric: Denies confusion, memory loss or sensory loss. Endo: Denies change in weight, skin or hair change.  Heme/Lymph: No excessive bleeding, bruising or  enlarged lymph nodes.  Physical Exam  BP 122/74   Pulse 72   Temp (!) 97 F (36.1 C)   Resp 16   Ht 5\' 4"  (1.626 m)   Wt 167 lb 9.6 oz (76 kg)   BMI 28.77 kg/m   Appears  well nourished, well groomed  and in no distress.  Eyes: PERRLA, EOMs, conjunctiva no swelling or erythema. Sinuses: No frontal/maxillary tenderness ENT/Mouth: EAC's clear, TM's nl w/o erythema, bulging. Nares clear w/o erythema, swelling, exudates. Oropharynx clear without erythema or exudates. Oral hygiene is good. Tongue normal, non obstructing. Hearing intact.  Neck: Supple. Thyroid not palpable. Car 2+/2+ without bruits, nodes or JVD. Chest: Respirations nl with BS clear & equal w/o rales, rhonchi, wheezing or stridor.  Cor: Heart sounds normal w/ regular rate and rhythm without sig. murmurs, gallops, clicks or rubs. Peripheral pulses normal and equal  without edema.  Abdomen: Soft & bowel sounds normal. Non-tender w/o guarding, rebound, hernias, masses or organomegaly.  Lymphatics: Unremarkable.  Musculoskeletal: Full ROM all peripheral extremities, joint stability, 5/5 strength and normal gait.  Skin: Warm, dry without exposed rashes, lesions or ecchymosis apparent.  Neuro: Cranial nerves intact, reflexes equal bilaterally. Sensory-motor testing grossly intact. Tendon reflexes grossly intact.  Pysch: Alert & oriented x 3.  Insight and judgement nl & appropriate. No ideations.  Assessment and Plan:  1. Labile hypertension  - Continue medication, monitor blood pressure at home.  - Continue DASH diet.  Reminder to go to the ER if any CP,  SOB, nausea, dizziness, severe HA, changes vision/speech.  - CBC with Differential/Platelet - COMPLETE METABOLIC PANEL WITH GFR - Magnesium - TSH  2. Hyperlipidemia, mixed  - Continue diet/meds, exercise,& lifestyle modifications.  - Continue monitor periodic cholesterol/liver & renal functions   - Lipid panel - TSH  3. Abnormal glucose  - Hemoglobin A1c -  Insulin, random  4. Vitamin D deficiency  - Continue diet, exercise  - Lifestyle modifications.  - Monitor appropriate labs. - Continue supplementation.  - VITAMIN D 25 Hydroxy  5. Prediabetes  - Hemoglobin A1c - Insulin, random  6. Gastroesophageal reflux disease  - CBC with Differential/Platelet  7. Medication management  - CBC with Differential/Platelet - COMPLETE METABOLIC PANEL WITH GFR - Magnesium - Lipid panel - TSH - Hemoglobin A1c - Insulin, random - VITAMIN D 25 Hydroxy        Discussed  regular exercise, BP monitoring, weight control to achieve/maintain BMI less than 25 and discussed med and SE's. Recommended labs to assess and monitor clinical status with further disposition pending results of labs.  I discussed the assessment and treatment plan with the patient. The patient was provided an opportunity to ask questions and all were answered. The patient agreed with the plan and demonstrated an understanding of the instructions.  I provided over 30 minutes of exam, counseling, chart review and  complex critical decision making.   Kirtland Bouchard, MD

## 2019-10-25 NOTE — Patient Instructions (Signed)

## 2019-10-26 LAB — COMPLETE METABOLIC PANEL WITH GFR
AG Ratio: 1.4 (calc) (ref 1.0–2.5)
ALT: 11 U/L (ref 6–29)
AST: 19 U/L (ref 10–35)
Albumin: 4.2 g/dL (ref 3.6–5.1)
Alkaline phosphatase (APISO): 61 U/L (ref 37–153)
BUN: 20 mg/dL (ref 7–25)
CO2: 27 mmol/L (ref 20–32)
Calcium: 10.1 mg/dL (ref 8.6–10.4)
Chloride: 105 mmol/L (ref 98–110)
Creat: 0.88 mg/dL (ref 0.50–0.99)
GFR, Est African American: 78 mL/min/{1.73_m2} (ref >=60)
GFR, Est Non African American: 67 mL/min/{1.73_m2} (ref >=60)
Globulin: 2.9 g/dL (calc) (ref 1.9–3.7)
Glucose, Bld: 96 mg/dL (ref 65–99)
Potassium: 3.9 mmol/L (ref 3.5–5.3)
Sodium: 139 mmol/L (ref 135–146)
Total Bilirubin: 0.5 mg/dL (ref 0.2–1.2)
Total Protein: 7.1 g/dL (ref 6.1–8.1)

## 2019-10-26 LAB — MAGNESIUM: Magnesium: 2 mg/dL (ref 1.5–2.5)

## 2019-10-26 LAB — CBC WITH DIFFERENTIAL/PLATELET
Absolute Monocytes: 348 cells/uL (ref 200–950)
Basophils Absolute: 58 cells/uL (ref 0–200)
Basophils Relative: 1 %
Eosinophils Absolute: 267 cells/uL (ref 15–500)
Eosinophils Relative: 4.6 %
HCT: 40.6 % (ref 35.0–45.0)
Hemoglobin: 13.6 g/dL (ref 11.7–15.5)
Lymphs Abs: 2030 cells/uL (ref 850–3900)
MCH: 31.3 pg (ref 27.0–33.0)
MCHC: 33.5 g/dL (ref 32.0–36.0)
MCV: 93.3 fL (ref 80.0–100.0)
MPV: 10.4 fL (ref 7.5–12.5)
Monocytes Relative: 6 %
Neutro Abs: 3097 cells/uL (ref 1500–7800)
Neutrophils Relative %: 53.4 %
Platelets: 266 10*3/uL (ref 140–400)
RBC: 4.35 10*6/uL (ref 3.80–5.10)
RDW: 12.5 % (ref 11.0–15.0)
Total Lymphocyte: 35 %
WBC: 5.8 10*3/uL (ref 3.8–10.8)

## 2019-10-26 LAB — LIPID PANEL
Cholesterol: 233 mg/dL — ABNORMAL HIGH (ref <200)
HDL: 59 mg/dL (ref >=50)
LDL Cholesterol (Calc): 153 mg/dL (calc) — ABNORMAL HIGH
Non-HDL Cholesterol (Calc): 174 mg/dL (calc) — ABNORMAL HIGH (ref <130)
Total CHOL/HDL Ratio: 3.9 (calc) (ref <5.0)
Triglycerides: 97 mg/dL (ref <150)

## 2019-10-26 LAB — INSULIN, RANDOM: Insulin: 18.6 u[IU]/mL

## 2019-10-26 LAB — VITAMIN D 25 HYDROXY (VIT D DEFICIENCY, FRACTURES): Vit D, 25-Hydroxy: 58 ng/mL (ref 30–100)

## 2019-10-26 LAB — HEMOGLOBIN A1C
Hgb A1c MFr Bld: 5.9 % of total Hgb — ABNORMAL HIGH (ref <5.7)
Mean Plasma Glucose: 123 (calc)
eAG (mmol/L): 6.8 (calc)

## 2019-10-26 LAB — TSH: TSH: 0.37 mIU/L — ABNORMAL LOW (ref 0.40–4.50)

## 2019-10-30 ENCOUNTER — Encounter: Payer: Self-pay | Admitting: Internal Medicine

## 2019-12-02 ENCOUNTER — Ambulatory Visit
Admission: RE | Admit: 2019-12-02 | Discharge: 2019-12-02 | Disposition: A | Discharge status: Home / Self Care | Payer: Medicare Other | Source: Ambulatory Visit | Attending: Internal Medicine | Admitting: Internal Medicine

## 2019-12-02 ENCOUNTER — Other Ambulatory Visit: Payer: Self-pay

## 2019-12-02 DIAGNOSIS — Z1231 Encounter for screening mammogram for malignant neoplasm of breast: Secondary | ICD-10-CM | POA: Diagnosis not present

## 2019-12-15 DIAGNOSIS — Z01419 Encounter for gynecological examination (general) (routine) without abnormal findings: Secondary | ICD-10-CM | POA: Diagnosis not present

## 2020-02-29 NOTE — Progress Notes (Deleted)
FOLLOW UP  Assessment and Plan:   Atherosclerosis of aorta Control blood pressure, cholesterol, glucose, increase exercise.   Hypertension Well controlled with current medications  Monitor blood pressure at home; patient to call if consistently greater than 130/80 Continue DASH diet.   Reminder to go to the ER if any CP, SOB, nausea, dizziness, severe HA, changes vision/speech, left arm numbness and tingling and jaw pain.  Cholesterol Currently above goal; on zetia, didn't tolerate low dose low frequency rosuvastatin Continue low cholesterol diet and exercise.  Check lipid panel.   Prediabetes Continue diet and exercise.  Perform daily foot/skin check, notify office of any concerning changes.  Check A1C q43m  Overweight  Long discussion about weight loss, diet, and exercise Recommended diet heavy in fruits and veggies and low in animal meats, cheeses, and dairy products, appropriate calorie intake Discussed ideal weight for height (below ***) and initial weight goal (***) Patient will work on *** Will follow up in 3 months  Vitamin D Def At goal at last visit; continue supplementation to maintain goal of 70-100 Defer Vit D level  Anxiety/insomnia Fairly *** managed by current regimen; continue medications Stress management techniques discussed, increase water, good sleep hygiene discussed, increase exercise, and increase veggies.    Continue diet and meds as discussed. Further disposition pending results of labs. Discussed med's effects and SE's.   Over 30 minutes of exam, counseling, chart review, and critical decision making was performed.   Future Appointments  Date Time Provider [place]  03/01/2020  4:15 PM Liane Comber, NP [facility] None  07/11/2020  3:00 PM Vicie Mutters, PA-C [facility] None    ----------------------------------------------------------------------------------------------------------------------  HPI 69 y.o. female  presents  for 3 month follow up on hypertension, cholesterol, prediabetes, weight and vitamin D deficiency.   She has mild depression/anxiety, with husband passed after advanced dementia, has declined meds in lieu of lifestyle *** Has insomnia, has taken lunestra 2 mg tabs PRN   BMI is There is no height or weight on file to calculate BMI., she {HAS HAS ZOX:09604} been working on diet and exercise. Wt Readings from Last 3 Encounters:  10/25/19 167 lb 9.6 oz (76 kg)  08/25/19 165 lb 9.6 oz (75.1 kg)  06/23/19 165 lb (74.8 kg)   She has aortic atherosclerosis per CT in 2019  Her blood pressure {HAS HAS NOT:18834} been controlled at home, today their BP is    She {DOES_DOES VWU:98119} workout. She denies chest pain, shortness of breath, dizziness.   She is on cholesterol medication Zetia and denies myalgias (had myalgias taking rosuvastatin 5 mg even 1-2 times per week, hasn't tried other statins ***). Her cholesterol is not at goal. The cholesterol last visit was:   Lab Results  Component Value Date   CHOL 233 (H) 10/25/2019   HDL 59 10/25/2019   LDLCALC 153 (H) 10/25/2019   TRIG 97 10/25/2019   CHOLHDL 3.9 10/25/2019    She {Has/has not:18111} been working on diet and exercise for prediabetes, and denies {Symptoms; diabetes w/o none:19199}. Last A1C in the office was:  Lab Results  Component Value Date   HGBA1C 5.9 (H) 10/25/2019   Patient is on Vitamin D supplement.   Lab Results  Component Value Date   VD25OH 58 10/25/2019        Current Medications:  Current Outpatient Medications on File Prior to Visit  Medication Sig   acyclovir (ZOVIRAX) 200 MG capsule Take 1 capsule 2 x /day as needed for Mouth Ulcers  aspirin EC 81 MG tablet Take 81 mg by mouth daily.   Cholecalciferol (VITAMIN D PO) Take 5,000 Units by mouth daily.   eszopiclone (LUNESTA) 2 MG TABS tablet TAKE 1 TABLET BY MOUTH AT BEDTIME FOR SLEEP   ezetimibe (ZETIA) 10 MG tablet Take 1 tablet Daily for Cholesterol    Glycerin-Polysorbate 80 (REFRESH DRY EYE THERAPY OP) Apply 1 drop to eye daily.    pantoprazole (PROTONIX) 40 MG tablet Take 1 tablet (40 mg total) by mouth daily.   vitamin C (ASCORBIC ACID) 500 MG tablet Take 500 mg by mouth 2 (two) times daily.   vitamin E 100 UNIT capsule Take 100 Units by mouth every morning.   Zinc 50 MG TABS Take 1 tablet Daily   No current facility-administered medications on file prior to visit.     Allergies: No Known Allergies   Medical History:  Past Medical History:  Diagnosis Date   Arthritis    Hyperlipidemia    Labile hypertension    Family history- Reviewed and unchanged Social history- Reviewed and unchanged   Review of Systems:  Review of Systems  Constitutional: Negative for malaise/fatigue and weight loss.  HENT: Negative for hearing loss and tinnitus.   Eyes: Negative for blurred vision and double vision.  Respiratory: Negative for cough, shortness of breath and wheezing.   Cardiovascular: Negative for chest pain, palpitations, orthopnea, claudication and leg swelling.  Gastrointestinal: Negative for abdominal pain, blood in stool, constipation, diarrhea, heartburn, melena, nausea and vomiting.  Genitourinary: Negative.   Musculoskeletal: Negative for joint pain and myalgias.  Skin: Negative for rash.  Neurological: Negative for dizziness, tingling, sensory change, weakness and headaches.  Endo/Heme/Allergies: Negative for polydipsia.  Psychiatric/Behavioral: Negative.   All other systems reviewed and are negative.     Physical Exam: There were no vitals taken for this visit. Wt Readings from Last 3 Encounters:  10/25/19 167 lb 9.6 oz (76 kg)  08/25/19 165 lb 9.6 oz (75.1 kg)  06/23/19 165 lb (74.8 kg)   General Appearance: Well nourished, in no apparent distress. Eyes: PERRLA, EOMs, conjunctiva no swelling or erythema Sinuses: No Frontal/maxillary tenderness ENT/Mouth: Ext aud canals clear, TMs without erythema,  bulging. No erythema, swelling, or exudate on post pharynx.  Tonsils not swollen or erythematous. Hearing normal.  Neck: Supple, thyroid normal.  Respiratory: Respiratory effort normal, BS equal bilaterally without rales, rhonchi, wheezing or stridor.  Cardio: RRR with no MRGs. Brisk peripheral pulses without edema.  Abdomen: Soft, + BS.  Non tender, no guarding, rebound, hernias, masses. Lymphatics: Non tender without lymphadenopathy.  Musculoskeletal: Full ROM, 5/5 strength, {PSY - GAIT AND STATION:22860} gait Skin: Warm, dry without rashes, lesions, ecchymosis.  Neuro: Cranial nerves intact. No cerebellar symptoms.  Psych: Awake and oriented X 3, normal affect, Insight and Judgment appropriate.    Izora Ribas, NP 2:28 PM [facility]

## 2020-03-01 ENCOUNTER — Ambulatory Visit (INDEPENDENT_AMBULATORY_CARE_PROVIDER_SITE_OTHER): Payer: Medicare Other | Admitting: Adult Health

## 2020-03-01 ENCOUNTER — Encounter: Payer: Self-pay | Admitting: Adult Health

## 2020-03-01 ENCOUNTER — Other Ambulatory Visit: Payer: Self-pay

## 2020-03-01 VITALS — BP 110/76 | HR 84 | Temp 97.4°F | Resp 16 | Ht 64.0 in | Wt 169.6 lb

## 2020-03-01 DIAGNOSIS — I700 Atherosclerosis of aorta: Secondary | ICD-10-CM | POA: Diagnosis not present

## 2020-03-01 DIAGNOSIS — M25551 Pain in right hip: Secondary | ICD-10-CM | POA: Diagnosis not present

## 2020-03-01 DIAGNOSIS — K219 Gastro-esophageal reflux disease without esophagitis: Secondary | ICD-10-CM

## 2020-03-01 DIAGNOSIS — F411 Generalized anxiety disorder: Secondary | ICD-10-CM | POA: Diagnosis not present

## 2020-03-01 DIAGNOSIS — E785 Hyperlipidemia, unspecified: Secondary | ICD-10-CM

## 2020-03-01 DIAGNOSIS — R6889 Other general symptoms and signs: Secondary | ICD-10-CM | POA: Diagnosis not present

## 2020-03-01 DIAGNOSIS — R7309 Other abnormal glucose: Secondary | ICD-10-CM

## 2020-03-01 DIAGNOSIS — Z79899 Other long term (current) drug therapy: Secondary | ICD-10-CM | POA: Diagnosis not present

## 2020-03-01 DIAGNOSIS — G4700 Insomnia, unspecified: Secondary | ICD-10-CM | POA: Diagnosis not present

## 2020-03-01 DIAGNOSIS — E663 Overweight: Secondary | ICD-10-CM

## 2020-03-01 DIAGNOSIS — E559 Vitamin D deficiency, unspecified: Secondary | ICD-10-CM

## 2020-03-01 DIAGNOSIS — Z0000 Encounter for general adult medical examination without abnormal findings: Secondary | ICD-10-CM

## 2020-03-01 DIAGNOSIS — Z0001 Encounter for general adult medical examination with abnormal findings: Secondary | ICD-10-CM | POA: Diagnosis not present

## 2020-03-01 DIAGNOSIS — I10 Essential (primary) hypertension: Secondary | ICD-10-CM | POA: Diagnosis not present

## 2020-03-01 LAB — CBC WITH DIFFERENTIAL/PLATELET
Absolute Monocytes: 334 cells/uL (ref 200–950)
Basophils Absolute: 28 cells/uL (ref 0–200)
Basophils Relative: 0.6 %
Eosinophils Absolute: 132 cells/uL (ref 15–500)
Eosinophils Relative: 2.8 %
HCT: 40 % (ref 35.0–45.0)
Hemoglobin: 13.3 g/dL (ref 11.7–15.5)
Lymphs Abs: 1810 cells/uL (ref 850–3900)
MCH: 31 pg (ref 27.0–33.0)
MCHC: 33.3 g/dL (ref 32.0–36.0)
MCV: 93.2 fL (ref 80.0–100.0)
MPV: 10 fL (ref 7.5–12.5)
Monocytes Relative: 7.1 %
Neutro Abs: 2397 cells/uL (ref 1500–7800)
Neutrophils Relative %: 51 %
Platelets: 267 10*3/uL (ref 140–400)
RBC: 4.29 10*6/uL (ref 3.80–5.10)
RDW: 12.6 % (ref 11.0–15.0)
Total Lymphocyte: 38.5 %
WBC: 4.7 10*3/uL (ref 3.8–10.8)

## 2020-03-01 LAB — TSH: TSH: 0.39 mIU/L — ABNORMAL LOW (ref 0.40–4.50)

## 2020-03-01 LAB — COMPLETE METABOLIC PANEL WITH GFR
AG Ratio: 1.6 (calc) (ref 1.0–2.5)
ALT: 12 U/L (ref 6–29)
AST: 19 U/L (ref 10–35)
Albumin: 4.1 g/dL (ref 3.6–5.1)
Alkaline phosphatase (APISO): 58 U/L (ref 37–153)
BUN: 14 mg/dL (ref 7–25)
CO2: 27 mmol/L (ref 20–32)
Calcium: 9.5 mg/dL (ref 8.6–10.4)
Chloride: 106 mmol/L (ref 98–110)
Creat: 0.87 mg/dL (ref 0.50–0.99)
GFR, Est African American: 79 mL/min/{1.73_m2} (ref >=60)
GFR, Est Non African American: 68 mL/min/{1.73_m2} (ref >=60)
Globulin: 2.5 g/dL (calc) (ref 1.9–3.7)
Glucose, Bld: 87 mg/dL (ref 65–99)
Potassium: 4.3 mmol/L (ref 3.5–5.3)
Sodium: 140 mmol/L (ref 135–146)
Total Bilirubin: 0.6 mg/dL (ref 0.2–1.2)
Total Protein: 6.6 g/dL (ref 6.1–8.1)

## 2020-03-01 LAB — LIPID PANEL
Cholesterol: 202 mg/dL — ABNORMAL HIGH (ref <200)
HDL: 50 mg/dL (ref >=50)
LDL Cholesterol (Calc): 132 mg/dL (calc) — ABNORMAL HIGH
Non-HDL Cholesterol (Calc): 152 mg/dL (calc) — ABNORMAL HIGH (ref <130)
Total CHOL/HDL Ratio: 4 (calc) (ref <5.0)
Triglycerides: 102 mg/dL (ref <150)

## 2020-03-01 LAB — MAGNESIUM: Magnesium: 2.3 mg/dL (ref 1.5–2.5)

## 2020-03-01 MED ORDER — PANTOPRAZOLE SODIUM 40 MG PO TBEC: 40.0000 mg | DELAYED_RELEASE_TABLET | Freq: Every day | ORAL | 1 refills | Status: DC

## 2020-03-01 NOTE — Progress Notes (Signed)
Medicare wellness and 3 month follow up  Assessment and Plan:  Encounter for Medicare annual wellness exam 1 year  Essential hypertension - continue medications, DASH diet, exercise and monitor at home. Call if greater than 130/80.   Gastroesophageal reflux disease, esophagitis presence not specified Continue PPI/H2 blocker, take more regularly, take 30 min prior to breakfast, diet discussed  Vitamin D deficiency Continue supplement  Medication management  Age-related osteoporosis without current pathological fracture Off fosamax, getting via GYN, states will follow up next year to get this  Hyperlipidemia, unspecified hyperlipidemia type check lipids She reports wasn't taking zetia regularly, now doing daily  If LDL remains above 100, consider rosuvastatin 5 mg 1-2 days per week decrease fatty foods increase activity.   Abnormal glucose Discussed disease progression and risks Discussed diet/exercise, weight management and risk modification  Insomnia, unspecified type Gabriel Earing works fairly well for her; continue, monitor closely - good sleep hygiene discussed, increase day time activity, try melatonin or benadryl if this does not help we will call in sleep medication.   Anxiety state Declines meds at this time Stress management techniques discussed, increase water, good sleep hygiene discussed, increase exercise, and increase veggies.   Overweight (BMI 25.0-29.9) - long discussion about weight loss, diet, and exercise -recommended diet heavy in fruits and veggies and low in animal meats, cheeses, and dairy products  R hip pain Get xray, exam suggestive of adductor group tendonitis but will r/o underlying structural issue, may also be compensating for knees Consider steroid taper if xray unremarkable, discussed voltaren, ice, exercises If persistent consider ortho referral   Discussed med's effects and SE's. Screening labs and tests as requested with regular follow-up  as recommended. Over 40 minutes of exam, counseling, chart review and critical decision making was performed  Future Appointments  Date Time Provider Grace  07/11/2020  3:00 PM Vicie Mutters, PA-C [facility] None     During the course of the visit the patient was educated and counseled about appropriate screening and preventive services including:    Pneumococcal vaccine   Prevnar 13  Influenza vaccine  Td vaccine  Screening electrocardiogram  Bone densitometry screening  Colorectal cancer screening  Diabetes screening  Glaucoma screening  Nutrition counseling   Advanced directives: requested  HPI  69 y.o. female  presents for wellness visit and 3 month follow up for HTN, chol, preDM, and obesity.   She reports R sided deep hip pain x 4-5 months, intermittent, but sometimes will have pain with walking, lifting leg. Describes as sharp pain, non-radiating. Has been applying salon pas without much benefit. Has tried tylenol and aleve but unsure if these really helped. She does have bil knee pain suspect for arthritis, hasn't seen ortho.   Her blood pressure has been controlled at home, states up from rushing, today their BP is BP: 110/76.  She does workout and also fairly active job. She denies chest pain, shortness of breath, dizziness.    She is on cholesterol medication, JUST zetia, admits wasn't taking regularly but has been since last visit.She has taken rosuvastatin 40 mg in the past but stopped due to myalgias, unsure if tried low dose low frequency or other statins. Her cholesterol is at goal. The cholesterol last visit was:   Lab Results  Component Value Date   CHOL 233 (H) 10/25/2019   HDL 59 10/25/2019   LDLCALC 153 (H) 10/25/2019   TRIG 97 10/25/2019   CHOLHDL 3.9 10/25/2019  . She has been working on diet  and exercise for prediabetes, she is on bASA, she is on ACE/ARB and denies foot ulcerations, hyperglycemia, hypoglycemia , increased  appetite, nausea, paresthesia of the feet, polydipsia, polyuria, visual disturbances, vomiting and weight loss. Last A1C in the office was:  Lab Results  Component Value Date   HGBA1C 5.9 (H) 10/25/2019  .  Patient is on Vitamin D supplement.   Lab Results  Component Value Date   VD25OH 58 10/25/2019     BMI is Body mass index is 29.11 kg/m., she is working on diet and exercise. Wt Readings from Last 3 Encounters:  03/01/20 169 lb 9.6 oz (76.9 kg)  10/25/19 167 lb 9.6 oz (76 kg)  08/25/19 165 lb 9.6 oz (75.1 kg)    Current Medications:  Current Outpatient Medications on File Prior to Visit  Medication Sig Dispense Refill  . acyclovir (ZOVIRAX) 200 MG capsule Take 1 capsule 2 x /day as needed for Mouth Ulcers 180 capsule 3  . aspirin EC 81 MG tablet Take 81 mg by mouth daily.    . Cholecalciferol (VITAMIN D PO) Take 5,000 Units by mouth daily.    . eszopiclone (LUNESTA) 2 MG TABS tablet TAKE 1 TABLET BY MOUTH AT BEDTIME FOR SLEEP 30 tablet 2  . ezetimibe (ZETIA) 10 MG tablet Take 1 tablet Daily for Cholesterol 90 tablet 3  . Glycerin-Polysorbate 80 (REFRESH DRY EYE THERAPY OP) Apply 1 drop to eye daily.     . vitamin C (ASCORBIC ACID) 500 MG tablet Take 500 mg by mouth 2 (two) times daily.    . vitamin E 100 UNIT capsule Take 100 Units by mouth every morning.    . Zinc 50 MG TABS Take 1 tablet Daily  0   No current facility-administered medications on file prior to visit.    Health Maintenance:   Immunization History  Administered Date(s) Administered  . Tdap 08/16/2012   Tetanus: 2014 Pneumovax: Declined Zostavax: Declined Influenza declines Covid 19: 2/2, 2021, pfizer   MGM: 11/2019 DEXA: 2013, gets at [facility] cologuard 03/04/2016 + Colonoscopy 06/12/2017, normal, Dr. Watt Climes, 10 years Sleep study 04/2014  Last Eye Exam:  Dr. Truman Hayward, last 2019, glasses, needs to schedule Last dental exam: Dr. Earlie Lou, last 2021, goes q62m  Patient Care Team: Unk Pinto, MD as PCP -  General (Internal Medicine) Barbaraann Cao, OD as Referring Physician (Optometry) Clarene Essex, MD as Consulting Physician (Gastroenterology) Christophe Louis, MD as Consulting Physician (Obstetrics and Gynecology) Melrose Nakayama, MD as Consulting Physician (Orthopedic Surgery) Dorna Leitz, MD as Consulting Physician (Orthopedic Surgery)  Medical History:  Past Medical History:  Diagnosis Date  . Arthritis   . Hyperlipidemia   . Labile hypertension    Allergies No Known Allergies  SURGICAL HISTORY She  has a past surgical history that includes Neck surgery and Reduction mammaplasty (Bilateral, 2007). FAMILY HISTORY Her family history includes Diabetes in her mother and another family member; Heart attack in her father and mother; Multiple sclerosis in an other family member. SOCIAL HISTORY She  reports that she has never smoked. She has never used smokeless tobacco. She reports that she does not drink alcohol and does not use drugs.  MEDICARE WELLNESS OBJECTIVES: Physical activity: Current Exercise Habits: Home exercise routine, Type of exercise: walking, Time (Minutes): 20, Frequency (Times/Week): 7, Weekly Exercise (Minutes/Week): 140, Intensity: Mild, Exercise limited by: orthopedic condition(s) Cardiac risk factors: Cardiac Risk Factors include: advanced age (>17men, >23 women);dyslipidemia;hypertension Depression/mood screen:   Depression screen [facility] 2/9 03/01/2020  Decreased Interest 0  Down, Depressed, Hopeless 0  PHQ - 2 Score 0    ADLs:  In your present state of health, do you have any difficulty performing the following activities: 03/01/2020 03/25/2019  Hearing? N N  Vision? N N  Difficulty concentrating or making decisions? N N  Walking or climbing stairs? N N  Dressing or bathing? N N  Doing errands, shopping? N N  Some recent data might be hidden     Cognitive Testing  Alert? Yes  Normal Appearance?Yes  Oriented to person? Yes  Place? Yes   Time?  Yes  Recall of three objects?  Yes  Can perform simple calculations? Yes  Displays appropriate judgment?Yes  Can read the correct time from a watch face?Yes  EOL planning: Does Patient Have a Medical Advance Directive?: No Would patient like information on creating a medical advance directive?: Yes (MAU/Ambulatory/Procedural Areas - Information given)  Review of Systems: Review of Systems  Constitutional: Negative for chills, fever and malaise/fatigue.  HENT: Negative for congestion, ear pain and sore throat.   Eyes: Negative.   Respiratory: Negative for cough, shortness of breath and wheezing.   Cardiovascular: Negative for chest pain, palpitations and leg swelling.  Gastrointestinal: Positive for heartburn. Negative for abdominal pain, blood in stool, constipation, diarrhea, melena, nausea and vomiting.  Genitourinary: Negative.   Musculoskeletal: Positive for joint pain (R hip).  Skin: Negative.   Neurological: Negative for dizziness, sensory change, loss of consciousness and headaches.  Psychiatric/Behavioral: Negative for depression. The patient is not nervous/anxious and does not have insomnia.     Physical Exam: Estimated body mass index is 29.11 kg/m as calculated from the following:   Height as of this encounter: 5\' 4"  (1.626 m).   Weight as of this encounter: 169 lb 9.6 oz (76.9 kg). BP 110/76   Pulse 84   Temp (!) 97.4 F (36.3 C)   Resp 16   Ht 5\' 4"  (1.626 m)   Wt 169 lb 9.6 oz (76.9 kg)   BMI 29.11 kg/m   General Appearance: Well nourished well developed, in no apparent distress.  Eyes: PERRLA, EOMs, conjunctiva no swelling or erythema ENT/Mouth: Ear canals normal without obstruction, swelling, erythema, or discharge.  TMs normal bilaterally with no erythema, bulging, retraction, or loss of landmark.  Oropharynx moist and clear with no exudate, erythema, or swelling.   Neck: Supple, thyroid normal. No bruits.  No cervical adenopathy Respiratory: Respiratory  effort normal, Breath sounds clear A&P without wheeze, rhonchi, rales.   Cardio: RRR without murmurs, rubs or gallops. Brisk peripheral pulses without edema.  Chest: non-tender, symmetric, with normal excursions Abdomen: Soft, non tender no guarding,  hernias, masses, or organomegaly.  Lymphatics: Non tender without lymphadenopathy.  Musculoskeletal: Full ROM all peripheral extremities, 5/5 strength, and normal gait. She does have tenderness over adductor and flexor groups of R lower extremity Skin: Warm, dry without rashes, lesions, ecchymosis. Neuro: Awake and oriented X 3, Cranial nerves intact, reflexes equal bilaterally. Normal muscle tone, no cerebellar symptoms. Sensation intact.  Psych:  normal affect, Insight and Judgment appropriate.    Medicare Attestation I have personally reviewed: The patient's medical and social history Their use of alcohol, tobacco or illicit drugs Their current medications and supplements The patient's functional ability including ADLs,fall risks, home safety risks, cognitive, and hearing and visual impairment Diet and physical activities Evidence for depression or mood disorders  The patient's weight, height, BMI, and visual acuity have been recorded in the chart.  I have made referrals, counseling,  and provided education to the patient based on review of the above and I have provided the patient with a written personalized care plan for preventive services.    Vicie Mutters 5:03 PM [facility]

## 2020-03-02 ENCOUNTER — Ambulatory Visit
Admission: RE | Admit: 2020-03-02 | Discharge: 2020-03-02 | Disposition: A | Discharge status: Home / Self Care | Payer: Medicare Other | Source: Ambulatory Visit | Attending: Physician Assistant | Admitting: Physician Assistant

## 2020-03-02 DIAGNOSIS — M25551 Pain in right hip: Secondary | ICD-10-CM | POA: Diagnosis not present

## 2020-03-02 DIAGNOSIS — M868X8 Other osteomyelitis, other site: Secondary | ICD-10-CM | POA: Diagnosis not present

## 2020-03-04 ENCOUNTER — Other Ambulatory Visit: Payer: Self-pay | Admitting: Physician Assistant

## 2020-03-04 ENCOUNTER — Other Ambulatory Visit: Payer: Self-pay | Admitting: Adult Health

## 2020-03-04 DIAGNOSIS — G4700 Insomnia, unspecified: Secondary | ICD-10-CM

## 2020-03-04 MED ORDER — ROSUVASTATIN CALCIUM 5 MG PO TABS: ORAL_TABLET | ORAL | 1 refills | Status: DC

## 2020-03-04 MED ORDER — PREDNISONE 20 MG PO TABS: ORAL_TABLET | ORAL | 0 refills | Status: DC

## 2020-03-13 ENCOUNTER — Other Ambulatory Visit: Payer: Self-pay

## 2020-03-13 DIAGNOSIS — Z111 Encounter for screening for respiratory tuberculosis: Secondary | ICD-10-CM

## 2020-03-14 ENCOUNTER — Ambulatory Visit (INDEPENDENT_AMBULATORY_CARE_PROVIDER_SITE_OTHER): Payer: Medicare Other

## 2020-03-14 ENCOUNTER — Other Ambulatory Visit: Payer: Self-pay

## 2020-03-14 VITALS — BP 128/88 | HR 67 | Temp 98.6°F | Wt 171.0 lb

## 2020-03-14 DIAGNOSIS — Z111 Encounter for screening for respiratory tuberculosis: Secondary | ICD-10-CM

## 2020-03-14 NOTE — Progress Notes (Signed)
Pt reports for TB test. Given in rt forearm  Pt will return on Friday for reading.

## 2020-03-16 ENCOUNTER — Other Ambulatory Visit: Payer: Self-pay | Admitting: *Deleted

## 2020-03-16 DIAGNOSIS — Z111 Encounter for screening for respiratory tuberculosis: Secondary | ICD-10-CM

## 2020-03-16 LAB — TB SKIN TEST
Induration: 0 mm
TB Skin Test: NEGATIVE

## 2020-04-18 ENCOUNTER — Other Ambulatory Visit: Payer: Self-pay | Admitting: Internal Medicine

## 2020-04-18 DIAGNOSIS — M25531 Pain in right wrist: Secondary | ICD-10-CM

## 2020-04-18 MED ORDER — CELECOXIB 100 MG PO CAPS: ORAL_CAPSULE | ORAL | 1 refills | Status: DC

## 2020-05-04 ENCOUNTER — Other Ambulatory Visit: Payer: Self-pay | Admitting: Internal Medicine

## 2020-05-04 MED ORDER — EZETIMIBE 10 MG PO TABS
ORAL_TABLET | ORAL | 0 refills | Status: DC
Start: 2020-05-04 — End: 2020-08-07

## 2020-05-09 ENCOUNTER — Other Ambulatory Visit: Payer: Self-pay | Admitting: Adult Health Nurse Practitioner

## 2020-05-09 DIAGNOSIS — G4700 Insomnia, unspecified: Secondary | ICD-10-CM

## 2020-05-09 MED ORDER — ESZOPICLONE 2 MG PO TABS: ORAL_TABLET | ORAL | 1 refills | Status: DC

## 2020-06-27 ENCOUNTER — Encounter: Payer: PPO | Admitting: Physician Assistant

## 2020-06-28 ENCOUNTER — Encounter: Payer: PPO | Admitting: Physician Assistant

## 2020-06-29 ENCOUNTER — Ambulatory Visit: Payer: Medicare Other | Attending: Internal Medicine

## 2020-06-29 DIAGNOSIS — Z23 Encounter for immunization: Secondary | ICD-10-CM

## 2020-06-29 NOTE — Progress Notes (Signed)
   Covid-19 Vaccination Clinic  Name:  Susan Carr    MRN: [id number] DOB: 10/19/1950  06/29/2020  Ms. Vanzandt was observed post Covid-19 immunization for 15 minutes without incident. She was provided with Vaccine Information Sheet and instruction to access the V-Safe system.   Ms. Brashears was instructed to call 911 with any severe reactions post vaccine: Marland Kitchen Difficulty breathing  . Swelling of face and throat  . A fast heartbeat  . A bad rash all over body  . Dizziness and weakness   Immunizations Administered    Name Date Dose VIS Date Route   Pfizer COVID-19 Vaccine 06/29/2020  4:57 PM 0.3 mL 05/02/2020 Intramuscular   Manufacturer: [place]   Lot: [id number]   [place]: [id number]

## 2020-07-11 ENCOUNTER — Ambulatory Visit: Payer: Medicare Other | Admitting: Adult Health Nurse Practitioner

## 2020-07-11 ENCOUNTER — Encounter: Payer: Self-pay | Admitting: Adult Health Nurse Practitioner

## 2020-07-11 ENCOUNTER — Other Ambulatory Visit: Payer: Self-pay

## 2020-07-11 DIAGNOSIS — F411 Generalized anxiety disorder: Secondary | ICD-10-CM | POA: Diagnosis not present

## 2020-07-11 DIAGNOSIS — Z79899 Other long term (current) drug therapy: Secondary | ICD-10-CM | POA: Diagnosis not present

## 2020-07-11 DIAGNOSIS — J324 Chronic pansinusitis: Secondary | ICD-10-CM | POA: Diagnosis not present

## 2020-07-11 DIAGNOSIS — G4700 Insomnia, unspecified: Secondary | ICD-10-CM

## 2020-07-11 DIAGNOSIS — Z1152 Encounter for screening for COVID-19: Secondary | ICD-10-CM | POA: Diagnosis not present

## 2020-07-11 LAB — POC COVID19 BINAXNOW: SARS Coronavirus 2 Ag: POSITIVE — AB

## 2020-07-11 MED ORDER — DEXAMETHASONE 4 MG PO TABS: ORAL_TABLET | ORAL | 1 refills | Status: DC

## 2020-07-11 NOTE — Patient Instructions (Addendum)
   Continue taking Mucinex every 12hours while you have symptoms.    You can continue to take Dayquil / theraflu.  A Dexamethasone steroid taper has been sent to the pharmacy for you to help reduce inflammation.  Please start taking this if your symptoms worsen.  At the pharmacy purchase and Oxygen sensor.  Goal is 93% or higher.  Make sure your finger is warm.  Nail polish may alter readings.  IF reading is less that 93% take a few deep breaths in through your nose and out your mouth to increase the readings.  **IF you are less than 90% and short of breath please SEEK IMMEDIATE ATTENTION via 911.**   Continue to take Vitamin D supplement to help your immune system.  Stay hydrated drinking lots of water or some Gatorade.   Please let us know if your symptoms change or worsen and you need help with symptoms management.   If You Test Positive for COVID-19 (Isolate) Everyone, regardless of vaccination status.  -Stay home for 5 days.  -If you have no fever or symptoms or your symptoms are resolving after 5 days, you can leave your house.  -Continue to wear a mask around others for 5 additional days. If you have a fever, continue to stay home until your fever resolves.

## 2020-07-11 NOTE — Progress Notes (Signed)
ACUTE VISIT  Virtual Visit via Telephone Note  I connected with Susan Carr on 07/11/20 at  3:00 PM EST by telephone and verified that I am speaking with the correct person using two identifiers.   I discussed the limitations, risks, security and privacy concerns of performing an evaluation and management service by telephone and the availability of in person appointments. I also discussed with the patient that there may be a patient responsible charge related to this service. The patient expressed understanding and agreed to proceed.  Diagnoses and all orders for this visit:  Encounter for screening for COVID-19 -     POC COVID-19  Discussed quarantine precautions with recent changes, 5 days.  Printed instructions provided to patient.  Pansinusitis, unspecified chronicity -     dexamethasone (DECADRON) 4 MG tablet; Take 1 tab 3 x day - 3 days, then 2 x day - 3 days, then 1 tab daily  Medication management Continued  Insomnia, unspecified type [name] works fairly well for her; continue, monitor closely - good sleep hygiene discussed, increase day time activity, try melatonin or benadryl if this does not help we will call in sleep medication.   Anxiety state Declines meds at this time Stress management techniques discussed, increase water, good sleep hygiene discussed, increase exercise, and increase veggies.    Discussed med's effects and SE's. Screening labs and tests as requested with regular follow-up as recommended. Over 30 minutes of face to face exam, counseling, chart review and critical decision making was performed  Future Appointments  Date Time Provider Department Center  07/11/2020  3:00 PM Elder NegusMcClanahan, Zayvian Mcmurtry, NP [facility] None  07/11/2021  3:00 PM [name], [name], NP [facility] None     HPI  69 y.o. female  presents for wellness visit and 3 month follow up for HTN, chol, preDM, and obesity.   She reports that her symptoms started on Sunday and she  thought she had a cold.  She had a mild cough and slightly irritated throat. She reports that her energy level has been normal and she has been working.  She reports she does not know if she has been around anyone of [facility] She has been taking mucinex and theraflu for her symptoms.   Current Medications:  Current Outpatient Medications on File Prior to Visit  Medication Sig Dispense Refill  . acyclovir (ZOVIRAX) 200 MG capsule Take 1 capsule 2 x /day as needed for Mouth Ulcers 180 capsule 3  . aspirin EC 81 MG tablet Take 81 mg by mouth daily.    . celecoxib (CELEBREX) 100 MG capsule Take     1 capsule     Daily      with Food for Pain & Inflammation 90 capsule 1  . Cholecalciferol (VITAMIN D PO) Take 5,000 Units by mouth daily.    . eszopiclone (LUNESTA) 2 MG TABS tablet Take immediately before bedtime 30 tablet 1  . ezetimibe (ZETIA) 10 MG tablet Take     1 tablet     Daily     for Cholesterol 90 tablet 0  . Glycerin-Polysorbate 80 (REFRESH DRY EYE THERAPY OP) Apply 1 drop to eye daily.     . pantoprazole (PROTONIX) 40 MG tablet Take 1 tablet (40 mg total) by mouth daily. 30 min before breakfast. 90 tablet 1  . rosuvastatin (CRESTOR) 5 MG tablet Please start by taking 1 tab once a week in the evening for cholesterol for 4 weeks. If tolerating without unusual  muscle aches, increase to taking twice a week. 30 tablet 1  . vitamin C (ASCORBIC ACID) 500 MG tablet Take 500 mg by mouth 2 (two) times daily.    . vitamin E 100 UNIT capsule Take 100 Units by mouth every morning.    . Zinc 50 MG TABS Take 1 tablet Daily  0   No current facility-administered medications on file prior to visit.    Health Maintenance:   Immunization History  Administered Date(s) Administered  . PFIZER SARS-COV-2 Vaccination 09/21/2019, 10/19/2019, 06/29/2020  . Tdap 08/16/2012   Tetanus: 2014 Pneumovax: Declined Zostavax: Declined Influenza declines Covid 19: 2/2, 2021, pfizer    MGM: 11/2019 DEXA: 2013, gets [facility] cologuard 03/04/2016 + Colonoscopy 06/12/2017, normal, Dr. Ewing Schlein, 10 years Sleep study 04/2014  Patient Care Team: Lucky Cowboy, MD as PCP - General (Internal Medicine) Antonietta Barcelona, OD as Referring Physician (Optometry) Vida Rigger, MD as Consulting Physician (Gastroenterology) Gerald Leitz, MD as Consulting Physician (Obstetrics and Gynecology) Marcene Corning, MD as Consulting Physician (Orthopedic Surgery) Jodi Geralds, MD as Consulting Physician (Orthopedic Surgery)  Medical History:  Past Medical History:  Diagnosis Date  . Arthritis   . Hyperlipidemia   . Labile hypertension    Allergies No Known Allergies  Review of Systems: Review of Systems  Constitutional: Negative for chills, fever and malaise/fatigue.  HENT: Negative for congestion, ear pain and sore throat.   Eyes: Negative.   Respiratory: Negative for cough, shortness of breath and wheezing.   Cardiovascular: Negative for chest pain, palpitations and leg swelling.  Gastrointestinal: Negative for abdominal pain, blood in stool, constipation, diarrhea, heartburn, melena, nausea and vomiting.  Genitourinary: Negative.   Musculoskeletal: Positive for joint pain (R hip). Negative for back pain, myalgias and neck pain.  Skin: Negative.   Neurological: Negative for dizziness, sensory change, loss of consciousness and headaches.  Psychiatric/Behavioral: Negative for depression. The patient is not nervous/anxious and does not have insomnia.     Physical Exam: Estimated body mass index is 29.35 kg/m as calculated from the following:   Height as of 03/01/20: 5\' 4"  (1.626 m).   Weight as of 03/14/20: 171 lb (77.6 kg). There were no vitals taken for this visit.   General : Well sounding patient in no apparent distress HEENT: no hoarseness, no cough for duration of visit Lungs: speaks in complete sentences, no audible wheezing, no apparent distress Neurological:  alert, oriented x 3 Psychiatric: pleasant, judgement appropriate    05/14/20, DNP [facility] 07/11/2020  4:33 PM

## 2020-07-18 ENCOUNTER — Other Ambulatory Visit: Payer: Self-pay | Admitting: Adult Health Nurse Practitioner

## 2020-07-18 DIAGNOSIS — G4700 Insomnia, unspecified: Secondary | ICD-10-CM

## 2020-08-07 ENCOUNTER — Other Ambulatory Visit: Payer: Self-pay | Admitting: Internal Medicine

## 2020-08-20 ENCOUNTER — Other Ambulatory Visit: Payer: Self-pay | Admitting: Adult Health

## 2020-08-20 DIAGNOSIS — G4700 Insomnia, unspecified: Secondary | ICD-10-CM

## 2020-09-18 ENCOUNTER — Other Ambulatory Visit: Payer: Self-pay | Admitting: Adult Health

## 2020-09-18 DIAGNOSIS — G4700 Insomnia, unspecified: Secondary | ICD-10-CM

## 2020-09-20 ENCOUNTER — Other Ambulatory Visit: Payer: Self-pay | Admitting: Adult Health

## 2020-09-20 DIAGNOSIS — G4700 Insomnia, unspecified: Secondary | ICD-10-CM

## 2020-09-20 MED ORDER — ESZOPICLONE 2 MG PO TABS: 2.0000 mg | ORAL_TABLET | Freq: Every day | ORAL | 0 refills | Status: DC

## 2020-09-27 ENCOUNTER — Ambulatory Visit: Payer: Medicare Other | Admitting: Adult Health

## 2020-10-18 ENCOUNTER — Other Ambulatory Visit: Payer: Self-pay | Admitting: Adult Health

## 2020-10-18 DIAGNOSIS — G4700 Insomnia, unspecified: Secondary | ICD-10-CM

## 2020-11-07 ENCOUNTER — Ambulatory Visit (INDEPENDENT_AMBULATORY_CARE_PROVIDER_SITE_OTHER): Payer: Medicare Other | Admitting: Adult Health

## 2020-11-07 ENCOUNTER — Other Ambulatory Visit: Payer: Self-pay

## 2020-11-07 ENCOUNTER — Encounter: Payer: Self-pay | Admitting: Adult Health

## 2020-11-07 VITALS — BP 110/66 | HR 76 | Temp 97.3°F | Wt 173.0 lb

## 2020-11-07 DIAGNOSIS — E785 Hyperlipidemia, unspecified: Secondary | ICD-10-CM | POA: Diagnosis not present

## 2020-11-07 DIAGNOSIS — E559 Vitamin D deficiency, unspecified: Secondary | ICD-10-CM

## 2020-11-07 DIAGNOSIS — G4700 Insomnia, unspecified: Secondary | ICD-10-CM | POA: Diagnosis not present

## 2020-11-07 DIAGNOSIS — I10 Essential (primary) hypertension: Secondary | ICD-10-CM

## 2020-11-07 DIAGNOSIS — K219 Gastro-esophageal reflux disease without esophagitis: Secondary | ICD-10-CM

## 2020-11-07 DIAGNOSIS — Z79899 Other long term (current) drug therapy: Secondary | ICD-10-CM

## 2020-11-07 DIAGNOSIS — I700 Atherosclerosis of aorta: Secondary | ICD-10-CM | POA: Diagnosis not present

## 2020-11-07 DIAGNOSIS — E663 Overweight: Secondary | ICD-10-CM | POA: Diagnosis not present

## 2020-11-07 DIAGNOSIS — R7309 Other abnormal glucose: Secondary | ICD-10-CM

## 2020-11-07 MED ORDER — ESZOPICLONE 2 MG PO TABS: ORAL_TABLET | ORAL | 2 refills | Status: DC

## 2020-11-07 NOTE — Patient Instructions (Addendum)
Tylenol, can take 2 extra strength morning and night as needed for joints  Voltaren or aspercreme - 3-4 times day, apply to joints and night add socks or gloves  Weight loss, regular exercise with water aerobics Try to wear supportive shoes Regular stretching   Try to eat lots of varieties of plants high in fiber - fresh fruits, veggies, beans, whole grains, nuts and seeds  Please set alarm for pantoprazole/protonix for acid reflux - ideally 30 min before breakfast, dinner or bedtime  Avoid lying down <4 hours after eating    Aim for 7+ servings of fruits and vegetables daily  65-80+ fluid ounces of water or unsweet tea for healthy kidneys  Limit to max 1 drink of alcohol per day;   Limit animal fats in diet for cholesterol and heart health - choose grass fed whenever available  Avoid highly processed foods, and foods high in saturated/trans fats  Aim for low stress - take time to unwind and care for your mental health  Aim for 150 min of moderate intensity exercise weekly for heart health, and weights twice weekly for bone health  Aim for 7-9 hours of sleep daily   Antiinflammatory diet  Doctors are learning that one of the best ways to reduce inflammation lies not in the medicine cabinet, but in the refrigerator.  By following an anti-inflammatory diet you can fight off inflammation for good.  What does an anti-inflammatory diet do? Your immune system becomes activated when your body recognizes anything that is foreign--such as an invading microbe, plant pollen, or chemical. This often triggers a process called inflammation. Intermittent bouts of inflammation directed at truly threatening invaders protect your health.  However, sometimes inflammation persists, day in and day out, even when you are not threatened by a foreign invader. That's when inflammation can become your enemy. Many major diseases that plague us--including cancer, heart disease, diabetes, arthritis,  depression, and Alzheimer's--have been linked to chronic inflammation.  One of the most powerful tools to combat inflammation comes not from the pharmacy, but from the grocery store.   Protect yourself from the damage of chronic inflammation. Science has proven that chronic, low-grade inflammation can turn into a silent killer that contributes to cardiovascular disease, cancer, type 2 diabetes and other conditions.   Choose the right anti-inflammatory foods, and you may be able to reduce your risk of illness. Consistently pick the wrong ones, and you could accelerate the inflammatory disease process.     Foods that cause inflammation Try to avoid or limit these foods as much as possible:   refined carbohydrates, such as white bread and pastries  [place] fries and other fried foods  soda and other sugar-sweetened beverages  red meat (burgers, steaks) and processed meat (hot dogs, sausage)  margarine, shortening, and lard  The health risks of inflammatory foods Not surprisingly, the same foods on an inflammation diet are generally considered bad for our health, including sodas and refined carbohydrates, as well as red meat and processed meats.  "Some of the foods that have been associated with an increased risk for chronic diseases such as type 2 diabetes and heart disease are also associated with excess inflammation," Dr. Melanee Spry says. "It's not surprising, since inflammation is an important underlying mechanism for the development of these diseases."  Unhealthy foods also contribute to weight gain, which is itself a risk factor for inflammation. Yet in several studies, even after researchers took obesity into account, the link between foods and inflammation remained, which  suggests weight gain isn't the sole driver. "Some of the food components or ingredients may have independent effects on inflammation over and above increased caloric intake," Dr. Melanee Spry says.  Anti-inflammatory foods An  anti-inflammatory diet should include these foods:   tomatoes  olive oil  green leafy vegetables, such as spinach, kale, and collards  nuts like almonds and walnuts  fatty fish like salmon, mackerel, tuna, and sardines  fruits such as strawberries, blueberries, cherries, and oranges  Benefits of anti-inflammatory foods On the flip side are beverages and foods that reduce inflammation, in particular fruits and vegetables such as blueberries, apples, and leafy greens that are high in natural antioxidants and polyphenols--protective compounds found in plants.  Studies have also associated nuts with reduced markers of inflammation and a lower risk of cardiovascular disease and diabetes. Coffee, which contains polyphenols and other anti-inflammatory compounds, may protect against inflammation, as well.  Anti-inflammatory diet To reduce levels of inflammation, aim for an overall healthy diet. If you're looking for an eating plan that closely follows the tenets of anti-inflammatory eating, consider the Mediterranean diet, which is high in fruits, vegetables, nuts, whole grains, fish, and healthy oils.  In addition to lowering inflammation, a more natural, less processed diet can have noticeable effects on your physical and emotional health and overall quality of life.   [email or web address]

## 2020-11-07 NOTE — Progress Notes (Signed)
6 month follow up  Assessment and Plan:   Essential hypertension - continue medications, DASH diet, exercise and monitor at home. Call if greater than 130/80.   Gastroesophageal reflux disease, esophagitis presence not specified Continue PPI/H2 blocker, take more regularly, take 30 min prior to breakfast, dinner or bedtime; diet/lifestyle reviewed  Vitamin D deficiency Continue supplement  Hyperlipidemia, unspecified hyperlipidemia type check lipids She reports taking zetia, stopped rosuvastatin, very resistant to statin therapy, questionable arthralgias Minimum LDL goal <130 Decrease saturated fats, increase soluble fiber increase activity.   Abnormal glucose Discussed disease progression and risks Discussed diet/exercise, weight management and risk modification  Insomnia, unspecified type Gabriel Earing works fairly well for her; continue, monitor closely - good sleep hygiene discussed, increase day time activity  Anxiety state Declines meds at this time Stress management techniques discussed, increase water, good sleep hygiene discussed, increase exercise, and increase veggies.   Overweight (BMI 25.0-29.9) - long discussion about weight loss, diet, and exercise -recommended diet heavy in fruits and veggies and low in animal meats, cheeses, and dairy products  Joint pain Likely some arthritis, though none significant on knee xray from 2020; on celebrex but with reflux/GI concerns defer increasing dose declines ortho referral at this time  Discussed voltaren, aspercreme, tylenol  Water aerobics, weight loss encouraged Follow up if not improving   Discussed med's effects and SE's. Screening labs and tests as requested with regular follow-up as recommended. Over 30 minutes of exam, counseling, chart review and critical decision making was performed  No future appointments.   HPI  70 y.o. female  presents for wellness visit and 3 month follow up for HTN, chol, preDM, and  obesity.   She reports many joints have been bothering her, bil knees swollen at night, also R hip still bothers her. She reports sx began without injury, started intermittently/gradually, just never mentioned, recently with daily pain. Has been using topical essential oil, salon pas. She does take celebrex 100 mg daily   BMI is Body mass index is 29.7 kg/m., she has been working on diet and exercise, she stretches in AM, wants to walk or stepper but limited time. Wants to restart with nephew as accountability partner. She has also joined a swim class.  Wt Readings from Last 3 Encounters:  11/07/20 173 lb (78.5 kg)  03/14/20 171 lb (77.6 kg)  03/01/20 169 lb 9.6 oz (76.9 kg)   Her blood pressure has been controlled at home, today their BP is BP: 110/66.  She does workout and also fairly active job. She denies chest pain, shortness of breath, dizziness.    She is on cholesterol medication, JUST zetia, does take regularly. She was prescribed rosuvastatin 5 mg, stopped as she thought was making joints worse but not sure, very resistant to statin therapy. Her cholesterol is at goal. The cholesterol last visit was:   Lab Results  Component Value Date   CHOL 202 (H) 03/01/2020   HDL 50 03/01/2020   LDLCALC 132 (H) 03/01/2020   TRIG 102 03/01/2020   CHOLHDL 4.0 03/01/2020  . She has been working on diet and exercise for prediabetes, she is on bASA, she is on ACE/ARB and denies foot ulcerations, hyperglycemia, hypoglycemia , increased appetite, nausea, paresthesia of the feet, polydipsia, polyuria, visual disturbances, vomiting and weight loss. Last A1C in the office was:  Lab Results  Component Value Date   HGBA1C 5.9 (H) 10/25/2019  .  Patient is on Vitamin D supplement.   Lab Results  Component Value  Date   VD25OH 13 10/25/2019     She does not have thyroid history, personal or family, denies weight loss, diarrhea, heat intolerance.  Lab Results  Component Value Date   TSH 0.39 (L)  03/01/2020    Lab Results  Component Value Date   WVPXTGGY69 485 02/20/2016     Current Medications:  Current Outpatient Medications on File Prior to Visit  Medication Sig Dispense Refill  . acyclovir (ZOVIRAX) 200 MG capsule Take 1 capsule 2 x /day as needed for Mouth Ulcers 180 capsule 3  . celecoxib (CELEBREX) 100 MG capsule Take     1 capsule     Daily      with Food for Pain & Inflammation 90 capsule 1  . Cholecalciferol (VITAMIN D PO) Take 5,000 Units by mouth daily.    . eszopiclone (LUNESTA) 2 MG TABS tablet TAKE 1 TABLET(2 MG) BY MOUTH AT BEDTIME 30 tablet 2  . ezetimibe (ZETIA) 10 MG tablet TAKE 1 TABLET BY MOUTH DAILY FOR CHOLESTEROL 90 tablet 0  . Glycerin-Polysorbate 80 (REFRESH DRY EYE THERAPY OP) Apply 1 drop to eye daily.    . pantoprazole (PROTONIX) 40 MG tablet Take 1 tablet (40 mg total) by mouth daily. 30 min before breakfast. 90 tablet 1  . vitamin C (ASCORBIC ACID) 500 MG tablet Take 500 mg by mouth 2 (two) times daily.    . vitamin E 100 UNIT capsule Take 100 Units by mouth every morning.    . Zinc 50 MG TABS Take 1 tablet Daily  0  . aspirin EC 81 MG tablet Take 81 mg by mouth daily. (Patient not taking: Reported on 11/07/2020)    . dexamethasone (DECADRON) 4 MG tablet Take 1 tab 3 x day - 3 days, then 2 x day - 3 days, then 1 tab daily 20 tablet 1  . rosuvastatin (CRESTOR) 5 MG tablet Please start by taking 1 tab once a week in the evening for cholesterol for 4 weeks. If tolerating without unusual muscle aches, increase to taking twice a week. (Patient not taking: Reported on 11/07/2020) 30 tablet 1   No current facility-administered medications on file prior to visit.    Medical History:  Past Medical History:  Diagnosis Date  . Arthritis   . Hyperlipidemia   . Labile hypertension    Allergies No Known Allergies  SURGICAL HISTORY She  has a past surgical history that includes Neck surgery and Reduction mammaplasty (Bilateral, 2007). FAMILY HISTORY Her  family history includes Diabetes in her mother and another family member; Heart attack in her father and mother; Multiple sclerosis in an other family member. SOCIAL HISTORY She  reports that she has never smoked. She has never used smokeless tobacco. She reports that she does not drink alcohol and does not use drugs.  Review of Systems: Review of Systems  Constitutional: Negative for chills, fever and malaise/fatigue.  HENT: Negative for congestion, ear pain and sore throat.   Eyes: Negative.   Respiratory: Negative for cough, shortness of breath and wheezing.   Cardiovascular: Negative for chest pain, palpitations and leg swelling.  Gastrointestinal: Positive for heartburn. Negative for abdominal pain, blood in stool, constipation, diarrhea, melena, nausea and vomiting.  Genitourinary: Negative.   Musculoskeletal: Positive for joint pain (R hip, knees, hands; stiffness <30 min). Negative for back pain and myalgias.  Skin: Negative.   Neurological: Negative for dizziness, sensory change, loss of consciousness and headaches.  Psychiatric/Behavioral: Negative for depression. The patient is not nervous/anxious and  does not have insomnia.     Physical Exam: Estimated body mass index is 29.7 kg/m as calculated from the following:   Height as of 03/01/20: 5\' 4"  (1.626 m).   Weight as of this encounter: 173 lb (78.5 kg). BP 110/66   Pulse 76   Temp (!) 97.3 F (36.3 C)   Wt 173 lb (78.5 kg)   SpO2 96%   BMI 29.70 kg/m   General Appearance: Well nourished well developed, in no apparent distress.  Eyes: PERRLA, EOMs, conjunctiva no swelling or erythema ENT/Mouth: Ear canals normal without obstruction, swelling, erythema, or discharge.  TMs normal bilaterally with no erythema, bulging, retraction, or loss of landmark.  Oropharynx moist and clear with no exudate, erythema, or swelling.   Neck: Supple, thyroid normal. No bruits.  No cervical adenopathy Respiratory: Respiratory effort normal,  Breath sounds clear A&P without wheeze, rhonchi, rales.   Cardio: RRR without murmurs, rubs or gallops. Brisk peripheral pulses without edema.  Chest: non-tender, symmetric, with normal excursions Abdomen: Soft, non tender no guarding,  hernias, masses, or organomegaly.  Lymphatics: Non tender without lymphadenopathy.  Musculoskeletal: Full ROM all peripheral extremities, 5/5 strength, and normal gait.  Skin: Warm, dry without rashes, lesions, ecchymosis. Neuro: Awake and oriented X 3, Cranial nerves intact, reflexes equal bilaterally. Normal muscle tone, no cerebellar symptoms. Sensation intact.  Psych:  normal affect, Insight and Judgment appropriate.    Gorden Harms Shauntea Lok 5:22 PM [facility]

## 2020-11-08 ENCOUNTER — Other Ambulatory Visit: Payer: Self-pay | Admitting: Adult Health

## 2020-11-08 LAB — CBC WITH DIFFERENTIAL/PLATELET
Absolute Monocytes: 442 cells/uL (ref 200–950)
Basophils Absolute: 31 cells/uL (ref 0–200)
Basophils Relative: 0.6 %
Eosinophils Absolute: 182 cells/uL (ref 15–500)
Eosinophils Relative: 3.5 %
HCT: 40.5 % (ref 35.0–45.0)
Hemoglobin: 13.5 g/dL (ref 11.7–15.5)
Lymphs Abs: 1867 cells/uL (ref 850–3900)
MCH: 31.5 pg (ref 27.0–33.0)
MCHC: 33.3 g/dL (ref 32.0–36.0)
MCV: 94.6 fL (ref 80.0–100.0)
MPV: 10.5 fL (ref 7.5–12.5)
Monocytes Relative: 8.5 %
Neutro Abs: 2678 cells/uL (ref 1500–7800)
Neutrophils Relative %: 51.5 %
Platelets: 257 10*3/uL (ref 140–400)
RBC: 4.28 10*6/uL (ref 3.80–5.10)
RDW: 12.3 % (ref 11.0–15.0)
Total Lymphocyte: 35.9 %
WBC: 5.2 10*3/uL (ref 3.8–10.8)

## 2020-11-08 LAB — MAGNESIUM: Magnesium: 2.4 mg/dL (ref 1.5–2.5)

## 2020-11-08 LAB — COMPLETE METABOLIC PANEL WITH GFR
AG Ratio: 1.6 (calc) (ref 1.0–2.5)
ALT: 17 U/L (ref 6–29)
AST: 23 U/L (ref 10–35)
Albumin: 4.3 g/dL (ref 3.6–5.1)
Alkaline phosphatase (APISO): 55 U/L (ref 37–153)
BUN: 19 mg/dL (ref 7–25)
CO2: 30 mmol/L (ref 20–32)
Calcium: 9.7 mg/dL (ref 8.6–10.4)
Chloride: 104 mmol/L (ref 98–110)
Creat: 0.85 mg/dL (ref 0.60–0.93)
GFR, Est African American: 80 mL/min/{1.73_m2} (ref >=60)
GFR, Est Non African American: 69 mL/min/{1.73_m2} (ref >=60)
Globulin: 2.7 g/dL (calc) (ref 1.9–3.7)
Glucose, Bld: 84 mg/dL (ref 65–99)
Potassium: 4.3 mmol/L (ref 3.5–5.3)
Sodium: 140 mmol/L (ref 135–146)
Total Bilirubin: 0.7 mg/dL (ref 0.2–1.2)
Total Protein: 7 g/dL (ref 6.1–8.1)

## 2020-11-08 LAB — HEMOGLOBIN A1C
Hgb A1c MFr Bld: 5.9 % of total Hgb — ABNORMAL HIGH (ref <5.7)
Mean Plasma Glucose: 123 mg/dL
eAG (mmol/L): 6.8 mmol/L

## 2020-11-08 LAB — LIPID PANEL
Cholesterol: 214 mg/dL — ABNORMAL HIGH (ref <200)
HDL: 59 mg/dL (ref >=50)
LDL Cholesterol (Calc): 130 mg/dL (calc) — ABNORMAL HIGH
Non-HDL Cholesterol (Calc): 155 mg/dL (calc) — ABNORMAL HIGH (ref <130)
Total CHOL/HDL Ratio: 3.6 (calc) (ref <5.0)
Triglycerides: 131 mg/dL (ref <150)

## 2020-11-08 LAB — VITAMIN D 25 HYDROXY (VIT D DEFICIENCY, FRACTURES): Vit D, 25-Hydroxy: 55 ng/mL (ref 30–100)

## 2020-11-08 LAB — TSH: TSH: 0.44 mIU/L (ref 0.40–4.50)

## 2020-11-20 ENCOUNTER — Other Ambulatory Visit: Payer: Self-pay | Admitting: Internal Medicine

## 2020-11-20 DIAGNOSIS — Z1231 Encounter for screening mammogram for malignant neoplasm of breast: Secondary | ICD-10-CM

## 2020-11-21 ENCOUNTER — Other Ambulatory Visit: Payer: Self-pay | Admitting: Internal Medicine

## 2020-11-21 DIAGNOSIS — B029 Zoster without complications: Secondary | ICD-10-CM

## 2020-11-21 MED ORDER — ACYCLOVIR 200 MG PO CAPS: ORAL_CAPSULE | ORAL | 3 refills | Status: DC

## 2021-01-15 ENCOUNTER — Other Ambulatory Visit: Payer: Self-pay

## 2021-01-15 ENCOUNTER — Ambulatory Visit
Admission: RE | Admit: 2021-01-15 | Discharge: 2021-01-15 | Disposition: A | Discharge status: Home / Self Care | Payer: Medicare Other | Source: Ambulatory Visit | Attending: Internal Medicine | Admitting: Internal Medicine

## 2021-01-15 DIAGNOSIS — Z1231 Encounter for screening mammogram for malignant neoplasm of breast: Secondary | ICD-10-CM | POA: Diagnosis not present

## 2021-01-31 ENCOUNTER — Other Ambulatory Visit: Payer: Self-pay

## 2021-01-31 ENCOUNTER — Ambulatory Visit (INDEPENDENT_AMBULATORY_CARE_PROVIDER_SITE_OTHER): Payer: Medicare Other | Admitting: Sports Medicine

## 2021-01-31 ENCOUNTER — Encounter: Payer: Self-pay | Admitting: Sports Medicine

## 2021-01-31 DIAGNOSIS — B351 Tinea unguium: Secondary | ICD-10-CM

## 2021-01-31 DIAGNOSIS — L603 Nail dystrophy: Secondary | ICD-10-CM | POA: Diagnosis not present

## 2021-01-31 DIAGNOSIS — M79674 Pain in right toe(s): Secondary | ICD-10-CM

## 2021-01-31 DIAGNOSIS — M79675 Pain in left toe(s): Secondary | ICD-10-CM | POA: Diagnosis not present

## 2021-01-31 NOTE — Patient Instructions (Signed)
Vinegar soaks 1 cup of white distilled vinegar to 8 cups of warm water.  Soak 20 mins. May repeat soak two times per week.  If there is thickness to nails may file nails after soaks or after bath/shower with nail file and apply tea tree oil. Apply oil daily to nails after filing for the best result.  

## 2021-01-31 NOTE — Progress Notes (Signed)
Subjective: Susan Carr is a 70 y.o. female patient seen today in office with complaint of mildly painful thickened and discolored nails that are lifting up at the big toenails that she just noticed. Patient is desiring treatment for nail changes; has not tried OTC topicals/Medication in the past with no improvement. Reports that nails are becoming difficult to manage because of the thickness and she is more concerned about the lifting up of the nails. Patient has no other pedal complaints at this time.   Patient Active Problem List   Diagnosis Date Noted   Right hip pain 03/01/2020   Aortic atherosclerosis ([place]) 06/22/2019   GERD (gastroesophageal reflux disease) 03/04/2018   Overweight (BMI 25.0-29.9) 03/04/2018   Hypertension 02/20/2016   Osteoporosis 02/20/2016   Medication management 11/28/2013   Abnormal glucose 06/30/2013   Vitamin D deficiency 06/30/2013   Insomnia 06/30/2013   Anxiety state 06/30/2013   Hyperlipidemia     Current Outpatient Medications on File Prior to Visit  Medication Sig Dispense Refill   acyclovir (ZOVIRAX) 200 MG capsule Take 1 capsule 2 x /day as needed for Mouth Ulcers 180 capsule 3   celecoxib (CELEBREX) 100 MG capsule Take     1 capsule     Daily      with Food for Pain & Inflammation 90 capsule 1   Cholecalciferol (VITAMIN D PO) Take 5,000 Units by mouth daily.     eszopiclone (LUNESTA) 2 MG TABS tablet TAKE 1 TABLET(2 MG) BY MOUTH AT BEDTIME 30 tablet 2   ezetimibe (ZETIA) 10 MG tablet TAKE 1 TABLET BY MOUTH DAILY FOR CHOLESTEROL 90 tablet 0   Glycerin-Polysorbate 80 (REFRESH DRY EYE THERAPY OP) Apply 1 drop to eye daily.     pantoprazole (PROTONIX) 40 MG tablet Take 1 tablet (40 mg total) by mouth daily. 30 min before breakfast. 90 tablet 1   vitamin C (ASCORBIC ACID) 500 MG tablet Take 500 mg by mouth 2 (two) times daily.     vitamin E 100 UNIT capsule Take 100 Units by mouth every morning.     Zinc 50 MG TABS Take 1 tablet Daily  0   No  current facility-administered medications on file prior to visit.    No Known Allergies  Objective: Physical Exam  General: Well developed, nourished, no acute distress, awake, alert and oriented x 3  Vascular: Dorsalis pedis artery 2/4 bilateral, Posterior tibial artery 2/4 bilateral, skin temperature warm to warm proximal to distal bilateral lower extremities, no varicosities, pedal hair present bilateral.  Neurological: Gross sensation present via light touch bilateral.   Dermatological: Skin is warm, dry, and supple bilateral, bilateral hallux and fifth toenails are tender, short thick, and discolored with mild subungal debris with significant partial detachment bilateral hallux, no webspace macerations present bilateral, no open lesions present bilateral, no callus/corns/hyperkeratotic tissue present bilateral. No signs of infection bilateral.  Musculoskeletal: Asymptomatic bunion boney deformities noted bilateral. Muscular strength within normal limits without painon range of motion. No pain with calf compression bilateral.  Assessment and Plan:  Problem List Items Addressed This Visit   None Visit Diagnoses     Nail fungus    -  Primary   Relevant Orders   Fungus culture w smear   Toe pain, bilateral           -Examined patient -Discussed treatment options for painful dystrophic nails  -Fungal culture was obtained by removing a portion of the hard nail itself from each of the involved toenails using a  sterile nail nipper and sent to [facility] lab. Patient tolerated the biopsy procedure well without discomfort or need for anesthesia.  -Recommend vinegar soaks and tea tree oil meanwhile until we get the culture results back -Patient to return in 4 weeks for follow up evaluation and discussion of fungal culture results or sooner if symptoms worsen.  Landis Martins, DPM

## 2021-02-17 ENCOUNTER — Other Ambulatory Visit: Payer: Self-pay | Admitting: Internal Medicine

## 2021-02-17 ENCOUNTER — Other Ambulatory Visit: Payer: Self-pay | Admitting: Adult Health

## 2021-02-17 DIAGNOSIS — K219 Gastro-esophageal reflux disease without esophagitis: Secondary | ICD-10-CM

## 2021-02-17 MED ORDER — PANTOPRAZOLE SODIUM 40 MG PO TBEC: DELAYED_RELEASE_TABLET | ORAL | 3 refills | Status: DC

## 2021-02-20 NOTE — Progress Notes (Signed)
Assessment and Plan:  Susan Carr was seen today for acute visit.  Diagnoses and all orders for this visit:  Neck muscle spasm Prednisone was not prescribed, Continue NSAIDs, try heat, massage, ROM - exercise given Discussed muscle relaxers but declines for now, can contact back and give if needed If not better follow up in office or will refer to PT for dry needling /orthopedics.  Further disposition pending results of labs. Discussed med's effects and SE's.   Over 15 minutes of exam, counseling, chart review, and critical decision making was performed.   Future Appointments  Date Time Provider [place]  02/28/2021  3:15 PM Landis Martins, DPM [facility]  05/14/2021  4:00 PM Liane Comber, NP [facility] None    ------------------------------------------------------------------------------------------------------------------   HPI BP 118/70   Temp (!) 97.5 F (36.4 C)   Wt 174 lb (78.9 kg)   BMI 29.87 kg/m  70 y.o.female presents for evaluation of swelling in R side of neck with tenderness.   She reports this began last week. Gradual onset without notable event prior. Denies URI sx, fever chills. She notes some pulling sensation with rotation, particularly with quick movement. No pain at rest, only with movement. 7/10 with movement, none at rest. She has tried some tylenol without benefit. Notes after shower seems to improve. Denies upper extremity weakness, radicular pain, changes in sensation. She takes celebrex 100 mg daily. Has improved since onset of sx.   Past Medical History:  Diagnosis Date   Arthritis    Hyperlipidemia    Labile hypertension      No Known Allergies  Current Outpatient Medications on File Prior to Visit  Medication Sig   acyclovir (ZOVIRAX) 200 MG capsule Take 1 capsule 2 x /day as needed for Mouth Ulcers   celecoxib (CELEBREX) 100 MG capsule Take     1 capsule     Daily      with Food for Pain & Inflammation   Cholecalciferol  (VITAMIN D PO) Take 5,000 Units by mouth daily.   eszopiclone (LUNESTA) 2 MG TABS tablet TAKE 1 TABLET(2 MG) BY MOUTH AT BEDTIME   ezetimibe (ZETIA) 10 MG tablet Take  1 tablet  Daily  for Cholesterol  / Patient knows to take by mouth   Glycerin-Polysorbate 80 (REFRESH DRY EYE THERAPY OP) Apply 1 drop to eye daily.   pantoprazole (PROTONIX) 40 MG tablet Take  1 tablet  Daily  to Prevent Heartburn and Indigestion   vitamin C (ASCORBIC ACID) 500 MG tablet Take 500 mg by mouth 2 (two) times daily.   vitamin E 100 UNIT capsule Take 100 Units by mouth every morning.   Zinc 50 MG TABS Take 1 tablet Daily   No current facility-administered medications on file prior to visit.    ROS: all negative except above.   Physical Exam:  BP 118/70   Temp (!) 97.5 F (36.4 C)   Wt 174 lb (78.9 kg)   BMI 29.87 kg/m   General Appearance: Well nourished, in no apparent distress. Eyes: PERRLA, conjunctiva no swelling or erythema Sinuses: No Frontal/maxillary tenderness ENT/Mouth: Ext aud canals clear, TMs without erythema, bulging. No erythema, swelling, or exudate on post pharynx.  Tonsils not swollen or erythematous. Hearing normal.  Neck: Supple, thyroid normal.  Respiratory: Respiratory effort normal, BS equal bilaterally without rales, rhonchi, wheezing or stridor.  Cardio: RRR with no MRGs. Brisk peripheral pulses without edema.  Lymphatics: Non tender without lymphadenopathy.  Musculoskeletal: Full ROM cervical and bil upper extremities, 5/5  strength, normal gait. No midline spinous tenderness, pain with neck rotation in R trap, muscle tenderness. Non-tender sternocleidomastoid.  Skin: Warm, dry without rashes, lesions, ecchymosis.  Neuro: Normal muscle tone, Sensation intact.  Psych: Awake and oriented X 3, normal affect, Insight and Judgment appropriate.     Izora Ribas, NP 3:32 PM [facility]

## 2021-02-21 ENCOUNTER — Other Ambulatory Visit: Payer: Self-pay

## 2021-02-21 ENCOUNTER — Encounter: Payer: Self-pay | Admitting: Adult Health

## 2021-02-21 ENCOUNTER — Other Ambulatory Visit: Payer: Self-pay | Admitting: Adult Health

## 2021-02-21 ENCOUNTER — Ambulatory Visit (INDEPENDENT_AMBULATORY_CARE_PROVIDER_SITE_OTHER): Payer: Medicare Other | Admitting: Adult Health

## 2021-02-21 VITALS — BP 118/70 | Temp 97.5°F | Wt 174.0 lb

## 2021-02-21 DIAGNOSIS — M62838 Other muscle spasm: Secondary | ICD-10-CM | POA: Diagnosis not present

## 2021-02-21 DIAGNOSIS — G4700 Insomnia, unspecified: Secondary | ICD-10-CM

## 2021-02-21 NOTE — Patient Instructions (Signed)
Neck Exercises Ask your health care provider which exercises are safe for you. Do exercises exactly as told by your health care provider and adjust them as directed. It is normal to feel mild stretching, pulling, tightness, or discomfort as you do these exercises. Stop right away if you feel sudden pain or your pain gets worse. Do not begin these exercises until told by your health care provider. Neck exercises can be important for many reasons. They can improve strength and maintain flexibility in your neck, which will help your upper back and preventneck pain. Stretching exercises Rotation neck stretching  Sit in a chair or stand up. Place your feet flat on the floor, shoulder width apart. Slowly turn your head (rotate) to the right until a slight stretch is felt. Turn it all the way to the right so you can look over your right shoulder. Do not tilt or tip your head. Hold this position for 10-30 seconds. Slowly turn your head (rotate) to the left until a slight stretch is felt. Turn it all the way to the left so you can look over your left shoulder. Do not tilt or tip your head. Hold this position for 10-30 seconds. Repeat __________ times. Complete this exercise __________ times a day. Neck retraction Sit in a sturdy chair or stand up. Look straight ahead. Do not bend your neck. Use your fingers to push your chin backward (retraction). Do not bend your neck for this movement. Continue to face straight ahead. If you are doing the exercise properly, you will feel a slight sensation in your throat and a stretch at the back of your neck. Hold the stretch for 1-2 seconds. Repeat __________ times. Complete this exercise __________ times a day. Strengthening exercises Neck press Lie on your back on a firm bed or on the floor with a pillow under your head. Use your neck muscles to push your head down on the pillow and straighten your spine. Hold the position as well as you can. Keep your head  facing up (in a neutral position) and your chin tucked. Slowly count to 5 while holding this position. Repeat __________ times. Complete this exercise __________ times a day. Isometrics These are exercises in which you strengthen the muscles in your neck while keeping your neck still (isometrics). Sit in a supportive chair and place your hand on your forehead. Keep your head and face facing straight ahead. Do not flex or extend your neck while doing isometrics. Push forward with your head and neck while pushing back with your hand. Hold for 10 seconds. Do the sequence again, this time putting your hand against the back of your head. Use your head and neck to push backward against the hand pressure. Finally, do the same exercise on either side of your head, pushing sideways against the pressure of your hand. Repeat __________ times. Complete this exercise __________ times a day. Prone head lifts Lie face-down (prone position), resting on your elbows so that your chest and upper back are raised. Start with your head facing downward, near your chest. Position your chin either on or near your chest. Slowly lift your head upward. Lift until you are looking straight ahead. Then continue lifting your head as far back as you can comfortably stretch. Hold your head up for 5 seconds. Then slowly lower it to your starting position. Repeat __________ times. Complete this exercise __________ times a day. Supine head lifts Lie on your back (supine position), bending your knees to point to the   ceiling and keeping your feet flat on the floor. Lift your head slowly off the floor, raising your chin toward your chest. Hold for 5 seconds. Repeat __________ times. Complete this exercise __________ times a day. Scapular retraction Stand with your arms at your sides. Look straight ahead. Slowly pull both shoulders (scapulae) backward and downward (retraction) until you feel a stretch between your shoulder blades in  your upper back. Hold for 10-30 seconds. Relax and repeat. Repeat __________ times. Complete this exercise __________ times a day. Contact a health care provider if: Your neck pain or discomfort gets much worse when you do an exercise. Your neck pain or discomfort does not improve within 2 hours after you exercise. If you have any of these problems, stop exercising right away. Do not do the exercises again unless your health care provider says that you can. Get help right away if: You develop sudden, severe neck pain. If this happens, stop exercising right away. Do not do the exercises again unless your health care provider says that you can. This information is not intended to replace advice given to you by your health care provider. Make sure you discuss any questions you have with your healthcare provider. Document Revised: 04/28/2018 Document Reviewed: 04/28/2018 Elsevier Patient Education  2022 [employer]  

## 2021-02-28 ENCOUNTER — Encounter: Payer: Self-pay | Admitting: Sports Medicine

## 2021-02-28 ENCOUNTER — Telehealth: Payer: Self-pay

## 2021-02-28 ENCOUNTER — Other Ambulatory Visit: Payer: Self-pay

## 2021-02-28 ENCOUNTER — Ambulatory Visit (INDEPENDENT_AMBULATORY_CARE_PROVIDER_SITE_OTHER): Payer: Medicare Other | Admitting: Sports Medicine

## 2021-02-28 DIAGNOSIS — M79674 Pain in right toe(s): Secondary | ICD-10-CM

## 2021-02-28 DIAGNOSIS — B372 Candidiasis of skin and nail: Secondary | ICD-10-CM | POA: Diagnosis not present

## 2021-02-28 DIAGNOSIS — Z79899 Other long term (current) drug therapy: Secondary | ICD-10-CM | POA: Diagnosis not present

## 2021-02-28 DIAGNOSIS — M79675 Pain in left toe(s): Secondary | ICD-10-CM | POA: Diagnosis not present

## 2021-02-28 NOTE — Progress Notes (Signed)
Subjective: Susan Carr is a 70 y.o. female patient seen today in office for fungal culture results. Patient has no other pedal complaints at this time.   Patient Active Problem List   Diagnosis Date Noted   Right hip pain 03/01/2020   Aortic atherosclerosis ([place]) 06/22/2019   GERD (gastroesophageal reflux disease) 03/04/2018   Overweight (BMI 25.0-29.9) 03/04/2018   Hypertension 02/20/2016   Osteoporosis 02/20/2016   Medication management 11/28/2013   Abnormal glucose 06/30/2013   Vitamin D deficiency 06/30/2013   Insomnia 06/30/2013   Anxiety state 06/30/2013   Hyperlipidemia     Current Outpatient Medications on File Prior to Visit  Medication Sig Dispense Refill   acyclovir (ZOVIRAX) 200 MG capsule Take 1 capsule 2 x /day as needed for Mouth Ulcers 180 capsule 3   celecoxib (CELEBREX) 100 MG capsule Take     1 capsule     Daily      with Food for Pain & Inflammation 90 capsule 1   Cholecalciferol (VITAMIN D PO) Take 5,000 Units by mouth daily.     eszopiclone (LUNESTA) 2 MG TABS tablet TAKE 1 TABLET(2 MG) BY MOUTH AT BEDTIME AS NEEDED FOR SLEEP. 30 tablet 0   ezetimibe (ZETIA) 10 MG tablet Take  1 tablet  Daily  for Cholesterol  / Patient knows to take by mouth 90 tablet 3   Glycerin-Polysorbate 80 (REFRESH DRY EYE THERAPY OP) Apply 1 drop to eye daily.     pantoprazole (PROTONIX) 40 MG tablet Take  1 tablet  Daily  to Prevent Heartburn and Indigestion 90 tablet 3   vitamin C (ASCORBIC ACID) 500 MG tablet Take 500 mg by mouth 2 (two) times daily.     vitamin E 100 UNIT capsule Take 100 Units by mouth every morning.     Zinc 50 MG TABS Take 1 tablet Daily  0   No current facility-administered medications on file prior to visit.    No Known Allergies  Objective: Physical Exam  General: Well developed, nourished, no acute distress, awake, alert and oriented x 3  Vascular: Dorsalis pedis artery 2/4 bilateral, Posterior tibial artery 2/4 bilateral, skin temperature  warm to warm proximal to distal bilateral lower extremities, no varicosities, pedal hair present bilateral.   Neurological: Gross sensation present via light touch bilateral.    Dermatological: Skin is warm, dry, and supple bilateral, bilateral hallux and fifth toenails are tender, short thick, and discolored with mild subungal debris with significant partial detachment bilateral hallux, no webspace macerations present bilateral, no open lesions present bilateral, no callus/corns/hyperkeratotic tissue present bilateral. No signs of infection bilateral.   Musculoskeletal: Asymptomatic bunion boney deformities noted bilateral. Muscular strength within normal limits without painon range of motion. No pain with calf compression bilateral.  Fungal culture + Candida onychomycosis   Assessment and Plan:  Problem List Items Addressed This Visit   None Visit Diagnoses     Long-term use of high-risk medication    -  Primary   Relevant Orders   Hepatic Function Panel   Candida onychomycosis       Candida   Toe pain, bilateral           -Examined patient -Discussed treatment options for painful mycotic nails re; Candida and microtrauma to nails -Patient opt for oral Itraconazole with full understanding of medication risks; ordered LFTs for review if within normal limits will proceed with sending Rx to pharmacy for Itraconazole '100mg'$  PO daily. Anticipate 12 week course.  -Patient opt for topical  Tolcylen as well -Advised good hygiene habits and good supportive shoes -Patient to return in 6 weeks for follow up evaluation or sooner if symptoms worsen.  Landis Martins, DPM

## 2021-03-01 ENCOUNTER — Other Ambulatory Visit: Payer: Medicare Other

## 2021-03-01 ENCOUNTER — Other Ambulatory Visit: Payer: Self-pay | Admitting: *Deleted

## 2021-03-01 ENCOUNTER — Other Ambulatory Visit: Payer: Self-pay | Admitting: Internal Medicine

## 2021-03-01 DIAGNOSIS — Z79899 Other long term (current) drug therapy: Secondary | ICD-10-CM | POA: Diagnosis not present

## 2021-03-01 DIAGNOSIS — B351 Tinea unguium: Secondary | ICD-10-CM

## 2021-03-01 LAB — HEPATIC FUNCTION PANEL
AG Ratio: 1.6 (calc) (ref 1.0–2.5)
ALT: 14 U/L (ref 6–29)
AST: 20 U/L (ref 10–35)
Albumin: 4.2 g/dL (ref 3.6–5.1)
Alkaline phosphatase (APISO): 57 U/L (ref 37–153)
Bilirubin, Direct: 0.1 mg/dL (ref 0.0–0.2)
Globulin: 2.7 g/dL (calc) (ref 1.9–3.7)
Indirect Bilirubin: 0.6 mg/dL (calc) (ref 0.2–1.2)
Total Bilirubin: 0.7 mg/dL (ref 0.2–1.2)
Total Protein: 6.9 g/dL (ref 6.1–8.1)

## 2021-03-01 NOTE — Telephone Encounter (Signed)
error 

## 2021-03-02 NOTE — Progress Notes (Signed)
============================================================ ============================================================  -    Liver Enzyme tests are Normal   ============================================================ ============================================================

## 2021-03-04 ENCOUNTER — Other Ambulatory Visit: Payer: Self-pay | Admitting: Sports Medicine

## 2021-03-04 MED ORDER — ITRACONAZOLE 100 MG PO CAPS: 100.0000 mg | ORAL_CAPSULE | Freq: Every day | ORAL | 0 refills | Status: DC

## 2021-03-04 NOTE — Progress Notes (Signed)
Sent Itraconazole. LFTs normal

## 2021-03-16 ENCOUNTER — Other Ambulatory Visit: Payer: Self-pay | Admitting: Internal Medicine

## 2021-03-16 DIAGNOSIS — M25531 Pain in right wrist: Secondary | ICD-10-CM

## 2021-03-16 MED ORDER — CELECOXIB 100 MG PO CAPS: ORAL_CAPSULE | ORAL | 3 refills | Status: DC

## 2021-03-28 DIAGNOSIS — N898 Other specified noninflammatory disorders of vagina: Secondary | ICD-10-CM | POA: Diagnosis not present

## 2021-04-11 ENCOUNTER — Ambulatory Visit (INDEPENDENT_AMBULATORY_CARE_PROVIDER_SITE_OTHER): Payer: Medicare Other | Admitting: Sports Medicine

## 2021-04-11 ENCOUNTER — Other Ambulatory Visit: Payer: Self-pay

## 2021-04-11 DIAGNOSIS — M79675 Pain in left toe(s): Secondary | ICD-10-CM

## 2021-04-11 DIAGNOSIS — B372 Candidiasis of skin and nail: Secondary | ICD-10-CM | POA: Diagnosis not present

## 2021-04-11 DIAGNOSIS — M79674 Pain in right toe(s): Secondary | ICD-10-CM

## 2021-04-11 DIAGNOSIS — Z79899 Other long term (current) drug therapy: Secondary | ICD-10-CM

## 2021-04-11 NOTE — Progress Notes (Signed)
Subjective: Susan Carr is a 70 y.o. female patient seen today in office for medication check currently on I trichomonads all for [place]ida onychomycosis.  Patient reports that it is difficult for her to tell if anything is happening and does not like the appearance of her big toenails but otherwise she is doing okay using a different topical than the 1 I recommended.  No other pedal complaints noted.  Patient Active Problem List   Diagnosis Date Noted   Right hip pain 03/01/2020   Aortic atherosclerosis ([place]) 06/22/2019   GERD (gastroesophageal reflux disease) 03/04/2018   Overweight (BMI 25.0-29.9) 03/04/2018   Hypertension 02/20/2016   Osteoporosis 02/20/2016   Medication management 11/28/2013   Abnormal glucose 06/30/2013   Vitamin D deficiency 06/30/2013   Insomnia 06/30/2013   Anxiety state 06/30/2013   Hyperlipidemia     Current Outpatient Medications on File Prior to Visit  Medication Sig Dispense Refill   acyclovir (ZOVIRAX) 200 MG capsule Take 1 capsule 2 x /day as needed for Mouth Ulcers 180 capsule 3   celecoxib (CELEBREX) 100 MG capsule Take  1 capsule  Daily  with Food for Pain & Inflammation 90 capsule 3   Cholecalciferol (VITAMIN D PO) Take 5,000 Units by mouth daily.     eszopiclone (LUNESTA) 2 MG TABS tablet TAKE 1 TABLET(2 MG) BY MOUTH AT BEDTIME AS NEEDED FOR SLEEP. 30 tablet 0   ezetimibe (ZETIA) 10 MG tablet Take  1 tablet  Daily  for Cholesterol  / Patient knows to take by mouth 90 tablet 3   Glycerin-Polysorbate 80 (REFRESH DRY EYE THERAPY OP) Apply 1 drop to eye daily.     itraconazole (SPORANOX) 100 MG capsule Take 1 capsule (100 mg total) by mouth daily. 90 capsule 0   pantoprazole (PROTONIX) 40 MG tablet Take  1 tablet  Daily  to Prevent Heartburn and Indigestion 90 tablet 3   vitamin C (ASCORBIC ACID) 500 MG tablet Take 500 mg by mouth 2 (two) times daily.     vitamin E 100 UNIT capsule Take 100 Units by mouth every morning.     Zinc 50 MG TABS Take 1  tablet Daily  0   No current facility-administered medications on file prior to visit.    No Known Allergies  Objective: Physical Exam  General: Well developed, nourished, no acute distress, awake, alert and oriented x 3  Vascular: Dorsalis pedis artery 2/4 bilateral, Posterior tibial artery 2/4 bilateral, skin temperature warm to warm proximal to distal bilateral lower extremities, no varicosities, pedal hair present bilateral.   Neurological: Gross sensation present via light touch bilateral.    Dermatological: Skin is warm, dry, and supple bilateral, bilateral hallux and fifth toenails are tender, short thick, and discolored with mild subungal debris with significant partial detachment bilateral hallux, no webspace macerations present bilateral, no open lesions present bilateral, no callus/corns/hyperkeratotic tissue present bilateral. No signs of infection bilateral.   Musculoskeletal: Asymptomatic bunion boney deformities noted bilateral. Muscular strength within normal limits without painon range of motion. No pain with calf compression bilateral.  Fungal culture + Candida onychomycosis and microtrauma as previously noted  Assessment and Plan:  Problem List Items Addressed This Visit   None Visit Diagnoses     Long-term use of high-risk medication    -  Primary   Candida onychomycosis       Toe pain, bilateral           -Examined patient -Re-Discussed treatment options for painful mycotic nails re; Candida  and microtrauma to nails -At no charge mechanically debrided all nails using a sterile nail nipper without incident. -Continue with Itracoazole -Advised patient to bring with her the topical she got for me to review next visit  -Advised good hygiene habits and good supportive shoes like previous  -Patient to return in 6 weeks for follow up evaluation or sooner if symptoms worsen.  Landis Martins, DPM

## 2021-04-23 ENCOUNTER — Other Ambulatory Visit: Payer: Self-pay | Admitting: Adult Health

## 2021-04-23 DIAGNOSIS — G4700 Insomnia, unspecified: Secondary | ICD-10-CM

## 2021-04-30 DIAGNOSIS — H524 Presbyopia: Secondary | ICD-10-CM | POA: Diagnosis not present

## 2021-04-30 DIAGNOSIS — H2513 Age-related nuclear cataract, bilateral: Secondary | ICD-10-CM | POA: Diagnosis not present

## 2021-04-30 DIAGNOSIS — H5203 Hypermetropia, bilateral: Secondary | ICD-10-CM | POA: Diagnosis not present

## 2021-04-30 DIAGNOSIS — H52221 Regular astigmatism, right eye: Secondary | ICD-10-CM | POA: Diagnosis not present

## 2021-05-10 NOTE — Progress Notes (Signed)
AWV  Assessment and Plan:  Annual Medicare Wellness Visit Due annually  Health maintenance reviewed DNR filled out and signed with patient today per her request  Atherosclerosis of aorta ([place])- per CT 05/2018 Control blood pressure, cholesterol, glucose, increase exercise.   Essential hypertension - continue medications, DASH diet, exercise and monitor at home. Call if greater than 130/80.   Gastroesophageal reflux disease, esophagitis presence not specified Continue PPI/H2 blocker, add carafate for 6-12 weeks, if not improving emphasized important of GI evaluation  take 30 min prior to breakfast, diet discussed  Vitamin D deficiency Continue supplement  Medication management  Age-related osteoporosis without current pathological fracture Off fosamax, getting via GYN, requested report, states will follow up next year to get this  Hyperlipidemia, unspecified hyperlipidemia type check lipids She reports wasn't taking zetia regularly, now doing daily   Myalgia with rosuvastatin  decrease fatty foods increase activity.  - lipid panel   Abnormal glucose Discussed disease progression and risks Discussed diet/exercise, weight management and risk modification - Check A1C  Insomnia, unspecified type Gabriel Earing works fairly well for her; continue, monitor closely - good sleep hygiene discussed, increase day time activity, try melatonin or benadryl if this does not help we will call in sleep medication.   Anxiety state Declines meds at this time Stress management techniques discussed, increase water, good sleep hygiene discussed, increase exercise, and increase veggies.   Overweight (BMI 25.0-29.9) - long discussion about weight loss, diet, and exercise -recommended diet heavy in fruits and veggies and low in animal meats, cheeses, and dairy products  Bil knee pain Declines imaging, est with ortho, declines follow up at this time Monitor  Bil impacted cerumen Insuficient  time, schedule for NV irrigation on Thursday  - stop using Qtips, use OTC drops/oil  Orders Placed This Encounter  Procedures   CBC with Differential/Platelet   COMPLETE METABOLIC PANEL WITH GFR   Magnesium   Lipid panel   TSH   Hemoglobin A1c     Discussed med's effects and SE's. Screening labs and tests as requested with regular follow-up as recommended. Over 40 minutes of exam, counseling, chart review and critical decision making was performed  Future Appointments  Date Time Provider [place]  05/23/2021  4:15 PM Landis Martins, DPM [facility]  05/14/2022  4:00 PM Liane Comber, NP [facility] None     During the course of the visit the patient was educated and counseled about appropriate screening and preventive services including:   Pneumococcal vaccine  Prevnar 13 Influenza vaccine Td vaccine Screening electrocardiogram Bone densitometry screening Colorectal cancer screening Diabetes screening Glaucoma screening Nutrition counseling  Advanced directives: requested  HPI  70 y.o. female  presents for wellness visit and 3 month follow up. She has Hyperlipidemia; Abnormal glucose; Vitamin D deficiency; Insomnia; Anxiety state; Medication management; Hypertension; Osteoporosis; GERD (gastroesophageal reflux disease); Overweight (BMI 25.0-29.9); Aortic atherosclerosis ([place]) - per CT 05/2018; Right hip pain; and DNR (do not resuscitate) on their problem list.   She reports R sided deep hip pain x 4-5 months, intermittent, but sometimes will have pain with walking, lifting leg. Describes as sharp pain, non-radiating. Has been applying salon pas without much benefit. Has tried tylenol and aleve but unsure if these really helped. She did have normal xray in 2021.   She does have bil knee pain suspect for arthritis, hasn't seen ortho, has been wearing knee compression, taking celebrex with good results. She has seen Dr. Norm Salt in the past for shoulder.  She take lunestra for sleep, also reports taking ashwaganda and melatonin which she feels is helpful.   She has GERD, taking protonix 40 mg daily in AM, reports having some sx in the evening, will intermittently have sense of food "dragging" in chest.   BMI is Body mass index is 29.87 kg/m., she has been working on diet and exercise, was going to the gym, trying to restart. Minimal red meat/pork/shellfish.  Wt Readings from Last 3 Encounters:  05/14/21 174 lb (78.9 kg)  02/21/21 174 lb (78.9 kg)  11/07/20 173 lb (78.5 kg)   Her blood pressure has been controlled at home, states up from rushing, today their BP is BP: 128/70.  She does workout and also fairly active job. She denies chest pain, shortness of breath, dizziness.    She has aortic atherosclerosis per CT 05/2018.   She is on cholesterol medication, JUST zetia. Mostly eats poultry. She has taken rosuvastatin 40 mg in the past but stopped due to myalgias, unsure if tried low dose low frequency or other statins. Her cholesterol is at goal. The cholesterol last visit was:   Lab Results  Component Value Date   CHOL 214 (H) 11/07/2020   HDL 59 11/07/2020   LDLCALC 130 (H) 11/07/2020   TRIG 131 11/07/2020   CHOLHDL 3.6 11/07/2020  . She has been working on diet and exercise for prediabetes, she is on bASA, she is on ACE/ARB and denies foot ulcerations, hyperglycemia, hypoglycemia , increased appetite, nausea, paresthesia of the feet, polydipsia, polyuria, visual disturbances, vomiting and weight loss. Last A1C in the office was:  Lab Results  Component Value Date   HGBA1C 5.9 (H) 11/07/2020  .  Patient is on Vitamin D supplement.   Lab Results  Component Value Date   VD25OH 55 11/07/2020        Current Medications:  Current Outpatient Medications on File Prior to Visit  Medication Sig Dispense Refill   acyclovir (ZOVIRAX) 200 MG capsule Take 1 capsule 2 x /day as needed for Mouth Ulcers 180 capsule 3   celecoxib  (CELEBREX) 100 MG capsule Take  1 capsule  Daily  with Food for Pain & Inflammation 90 capsule 3   Cholecalciferol (VITAMIN D PO) Take 5,000 Units by mouth daily.     eszopiclone (LUNESTA) 2 MG TABS tablet TAKE 1 TABLET BY MOUTH AT BEDTIME 30 tablet 0   ezetimibe (ZETIA) 10 MG tablet Take  1 tablet  Daily  for Cholesterol  / Patient knows to take by mouth 90 tablet 3   Glycerin-Polysorbate 80 (REFRESH DRY EYE THERAPY OP) Apply 1 drop to eye daily.     itraconazole (SPORANOX) 100 MG capsule Take 1 capsule (100 mg total) by mouth daily. 90 capsule 0   pantoprazole (PROTONIX) 40 MG tablet Take  1 tablet  Daily  to Prevent Heartburn and Indigestion 90 tablet 3   vitamin C (ASCORBIC ACID) 500 MG tablet Take 500 mg by mouth 2 (two) times daily.     vitamin E 100 UNIT capsule Take 100 Units by mouth every morning.     Zinc 50 MG TABS Take 1 tablet Daily  0   No current facility-administered medications on file prior to visit.    Health Maintenance:   Immunization History  Administered Date(s) Administered   PFIZER(Purple Top)SARS-COV-2 Vaccination 09/21/2019, 10/19/2019, 06/29/2020   Tdap 08/16/2012   Tetanus: 2014 Pneumovax: Declined Zostavax: Declined Influenza declines Covid 19: 2/2, 2021, pfizer, booster 06/2020  MGM: 01/2021 DEXA: 2013,  gets at [facility], reports has planned for 2023 cologuard 03/04/2016 + Colonoscopy 06/12/2017, normal, Dr. Watt Climes, 10 years Sleep study 04/2014  Last Eye Exam:  Dr. Sabra Heck, last 2022, glasses Last dental exam: [facility], last 2022, goes q61m  Patient Care Team: Unk Pinto, MD as PCP - General (Internal Medicine) Barbaraann Cao, [place] as Referring Physician (Optometry) Clarene Essex, MD as Consulting Physician (Gastroenterology) Christophe Louis, MD as Consulting Physician (Obstetrics and Gynecology) Melrose Nakayama, MD as Consulting Physician (Orthopedic Surgery) Dorna Leitz, MD as Consulting Physician (Orthopedic Surgery)  Medical History:   Past Medical History:  Diagnosis Date   Arthritis    Hyperlipidemia    Labile hypertension    Allergies No Known Allergies  SURGICAL HISTORY She  has a past surgical history that includes Neck surgery and Reduction mammaplasty (Bilateral, 2007). FAMILY HISTORY Her family history includes Diabetes in her mother and another family member; Heart attack in her father and mother; Multiple sclerosis in an other family member. SOCIAL HISTORY She  reports that she has never smoked. She has never used smokeless tobacco. She reports that she does not drink alcohol and does not use drugs.  MEDICARE WELLNESS OBJECTIVES: Physical activity: Current Exercise Habits: Home exercise routine, Type of exercise: strength training/weights;walking, Time (Minutes): 30, Frequency (Times/Week): 3, Weekly Exercise (Minutes/Week): 90, Intensity: Mild, Exercise limited by: orthopedic condition(s) Cardiac risk factors: Cardiac Risk Factors include: advanced age (>70men, >68 women);dyslipidemia;hypertension Depression/mood screen:   Depression screen [facility] 2/9 05/14/2021  Decreased Interest 0  Down, Depressed, Hopeless 0  PHQ - 2 Score 0    ADLs:  In your present state of health, do you have any difficulty performing the following activities: 05/14/2021  Hearing? N  Vision? N  Difficulty concentrating or making decisions? N  Walking or climbing stairs? N  Dressing or bathing? N  Doing errands, shopping? N  Some recent data might be hidden      Cognitive Testing  Alert? Yes  Normal Appearance?Yes  Oriented to person? Yes  Place? Yes   Time? Yes  Recall of three objects?  Yes  Can perform simple calculations? Yes  Displays appropriate judgment?Yes  Can read the correct time from a watch face?Yes  EOL planning: Does Patient Have a Medical Advance Directive?: No Would patient like information on creating a medical advance directive?: Yes (MAU/Ambulatory/Procedural Areas - Information given)   Review of  Systems: Review of Systems  Constitutional:  Negative for chills, fever and malaise/fatigue.  HENT:  Negative for congestion, ear pain and sore throat.   Eyes: Negative.   Respiratory:  Negative for cough, shortness of breath and wheezing.   Cardiovascular:  Negative for chest pain, palpitations and leg swelling.  Gastrointestinal:  Positive for heartburn. Negative for abdominal pain, blood in stool, constipation, diarrhea, melena, nausea and vomiting.  Genitourinary: Negative.   Musculoskeletal:  Positive for joint pain (bil knees, L worse). Negative for back pain.  Skin: Negative.   Neurological:  Negative for dizziness, sensory change, loss of consciousness and headaches.  Psychiatric/Behavioral:  Negative for depression. The patient is not nervous/anxious and does not have insomnia.    Physical Exam: Estimated body mass index is 29.87 kg/m as calculated from the following:   Height as of 03/01/20: 5\' 4"  (1.626 m).   Weight as of this encounter: 174 lb (78.9 kg). BP 128/70   Pulse 75   Temp 97.7 F (36.5 C)   Wt 174 lb (78.9 kg)   SpO2 99%   BMI 29.87 kg/m  General Appearance: Well nourished well developed, in no apparent distress.  Eyes: PERRLA, EOMs, conjunctiva no swelling or erythema ENT/Mouth: Ear canals bil obstructed by soft wax.  Oropharynx moist and clear with no exudate, erythema, or swelling.   Neck: Supple, thyroid normal. No bruits.  No cervical adenopathy Respiratory: Respiratory effort normal, Breath sounds clear A&P without wheeze, rhonchi, rales.   Cardio: RRR without murmurs, rubs or gallops. Brisk peripheral pulses without edema.  Chest: non-tender, symmetric, with normal excursions Abdomen: Soft, non tender no guarding,  hernias, masses, or organomegaly.  Lymphatics: Non tender without lymphadenopathy.  Musculoskeletal: Full ROM all peripheral extremities, 5/5 strength, and normal gait. No effusions or obvious deformity.  Skin: Warm, dry without rashes,  lesions, ecchymosis. Neuro: Awake and oriented X 3, Cranial nerves intact, reflexes equal bilaterally. Normal muscle tone, no cerebellar symptoms. Sensation intact.  Psych:  normal affect, Insight and Judgment appropriate.    Medicare Attestation I have personally reviewed: The patient's medical and social history Their use of alcohol, tobacco or illicit drugs Their current medications and supplements The patient's functional ability including ADLs,fall risks, home safety risks, cognitive, and hearing and visual impairment Diet and physical activities Evidence for depression or mood disorders  The patient's weight, height, BMI, and visual acuity have been recorded in the chart.  I have made referrals, counseling, and provided education to the patient based on review of the above and I have provided the patient with a written personalized care plan for preventive services.    Gorden Harms Avett Reineck 4:54 PM [facility]

## 2021-05-14 ENCOUNTER — Other Ambulatory Visit: Payer: Self-pay

## 2021-05-14 ENCOUNTER — Other Ambulatory Visit: Payer: Self-pay | Admitting: Adult Health

## 2021-05-14 ENCOUNTER — Ambulatory Visit (INDEPENDENT_AMBULATORY_CARE_PROVIDER_SITE_OTHER): Payer: Medicare Other | Admitting: Adult Health

## 2021-05-14 ENCOUNTER — Encounter: Payer: Self-pay | Admitting: Adult Health

## 2021-05-14 VITALS — BP 128/70 | HR 75 | Temp 97.7°F | Wt 174.0 lb

## 2021-05-14 DIAGNOSIS — Z66 Do not resuscitate: Secondary | ICD-10-CM | POA: Diagnosis not present

## 2021-05-14 DIAGNOSIS — Z79899 Other long term (current) drug therapy: Secondary | ICD-10-CM | POA: Diagnosis not present

## 2021-05-14 DIAGNOSIS — M810 Age-related osteoporosis without current pathological fracture: Secondary | ICD-10-CM

## 2021-05-14 DIAGNOSIS — Z7189 Other specified counseling: Secondary | ICD-10-CM | POA: Diagnosis not present

## 2021-05-14 DIAGNOSIS — K219 Gastro-esophageal reflux disease without esophagitis: Secondary | ICD-10-CM | POA: Diagnosis not present

## 2021-05-14 DIAGNOSIS — G4700 Insomnia, unspecified: Secondary | ICD-10-CM | POA: Diagnosis not present

## 2021-05-14 DIAGNOSIS — M25551 Pain in right hip: Secondary | ICD-10-CM

## 2021-05-14 DIAGNOSIS — M25561 Pain in right knee: Secondary | ICD-10-CM

## 2021-05-14 DIAGNOSIS — I10 Essential (primary) hypertension: Secondary | ICD-10-CM | POA: Diagnosis not present

## 2021-05-14 DIAGNOSIS — E559 Vitamin D deficiency, unspecified: Secondary | ICD-10-CM | POA: Diagnosis not present

## 2021-05-14 DIAGNOSIS — F411 Generalized anxiety disorder: Secondary | ICD-10-CM

## 2021-05-14 DIAGNOSIS — E663 Overweight: Secondary | ICD-10-CM

## 2021-05-14 DIAGNOSIS — M25562 Pain in left knee: Secondary | ICD-10-CM

## 2021-05-14 DIAGNOSIS — R7309 Other abnormal glucose: Secondary | ICD-10-CM | POA: Diagnosis not present

## 2021-05-14 DIAGNOSIS — R6889 Other general symptoms and signs: Secondary | ICD-10-CM

## 2021-05-14 DIAGNOSIS — Z0001 Encounter for general adult medical examination with abnormal findings: Secondary | ICD-10-CM | POA: Diagnosis not present

## 2021-05-14 DIAGNOSIS — I700 Atherosclerosis of aorta: Secondary | ICD-10-CM | POA: Diagnosis not present

## 2021-05-14 DIAGNOSIS — Z0000 Encounter for general adult medical examination without abnormal findings: Secondary | ICD-10-CM

## 2021-05-14 DIAGNOSIS — E785 Hyperlipidemia, unspecified: Secondary | ICD-10-CM

## 2021-05-14 DIAGNOSIS — G8929 Other chronic pain: Secondary | ICD-10-CM | POA: Insufficient documentation

## 2021-05-14 DIAGNOSIS — H6123 Impacted cerumen, bilateral: Secondary | ICD-10-CM

## 2021-05-14 MED ORDER — SUCRALFATE 1 G PO TABS: ORAL_TABLET | ORAL | 1 refills | Status: DC

## 2021-05-15 LAB — CBC WITH DIFFERENTIAL/PLATELET
Absolute Monocytes: 320 cells/uL (ref 200–950)
Basophils Absolute: 41 cells/uL (ref 0–200)
Basophils Relative: 0.9 %
Eosinophils Absolute: 149 cells/uL (ref 15–500)
Eosinophils Relative: 3.3 %
HCT: 39 % (ref 35.0–45.0)
Hemoglobin: 13.3 g/dL (ref 11.7–15.5)
Lymphs Abs: 2007 cells/uL (ref 850–3900)
MCH: 32.4 pg (ref 27.0–33.0)
MCHC: 34.1 g/dL (ref 32.0–36.0)
MCV: 95.1 fL (ref 80.0–100.0)
MPV: 10.6 fL (ref 7.5–12.5)
Monocytes Relative: 7.1 %
Neutro Abs: 1985 cells/uL (ref 1500–7800)
Neutrophils Relative %: 44.1 %
Platelets: 246 10*3/uL (ref 140–400)
RBC: 4.1 10*6/uL (ref 3.80–5.10)
RDW: 11.9 % (ref 11.0–15.0)
Total Lymphocyte: 44.6 %
WBC: 4.5 10*3/uL (ref 3.8–10.8)

## 2021-05-15 LAB — COMPLETE METABOLIC PANEL WITH GFR
AG Ratio: 1.6 (calc) (ref 1.0–2.5)
ALT: 15 U/L (ref 6–29)
AST: 20 U/L (ref 10–35)
Albumin: 4.2 g/dL (ref 3.6–5.1)
Alkaline phosphatase (APISO): 60 U/L (ref 37–153)
BUN: 17 mg/dL (ref 7–25)
CO2: 30 mmol/L (ref 20–32)
Calcium: 9.5 mg/dL (ref 8.6–10.4)
Chloride: 106 mmol/L (ref 98–110)
Creat: 0.77 mg/dL (ref 0.60–1.00)
Globulin: 2.6 g/dL (calc) (ref 1.9–3.7)
Glucose, Bld: 107 mg/dL — ABNORMAL HIGH (ref 65–99)
Potassium: 4.3 mmol/L (ref 3.5–5.3)
Sodium: 142 mmol/L (ref 135–146)
Total Bilirubin: 0.5 mg/dL (ref 0.2–1.2)
Total Protein: 6.8 g/dL (ref 6.1–8.1)
eGFR: 83 mL/min/{1.73_m2} (ref >=60)

## 2021-05-15 LAB — LIPID PANEL
Cholesterol: 175 mg/dL (ref <200)
HDL: 52 mg/dL (ref >=50)
LDL Cholesterol (Calc): 99 mg/dL (calc)
Non-HDL Cholesterol (Calc): 123 mg/dL (calc) (ref <130)
Total CHOL/HDL Ratio: 3.4 (calc) (ref <5.0)
Triglycerides: 142 mg/dL (ref <150)

## 2021-05-15 LAB — HEMOGLOBIN A1C
Hgb A1c MFr Bld: 5.7 % of total Hgb — ABNORMAL HIGH (ref <5.7)
Mean Plasma Glucose: 117 mg/dL
eAG (mmol/L): 6.5 mmol/L

## 2021-05-15 LAB — MAGNESIUM: Magnesium: 2.3 mg/dL (ref 1.5–2.5)

## 2021-05-15 LAB — TSH: TSH: 0.33 mIU/L — ABNORMAL LOW (ref 0.40–4.50)

## 2021-05-21 ENCOUNTER — Other Ambulatory Visit: Payer: Self-pay

## 2021-05-21 ENCOUNTER — Ambulatory Visit: Payer: Medicare Other

## 2021-05-23 ENCOUNTER — Other Ambulatory Visit: Payer: Self-pay

## 2021-05-23 ENCOUNTER — Ambulatory Visit (INDEPENDENT_AMBULATORY_CARE_PROVIDER_SITE_OTHER): Payer: Medicare Other | Admitting: Sports Medicine

## 2021-05-23 DIAGNOSIS — Z79899 Other long term (current) drug therapy: Secondary | ICD-10-CM

## 2021-05-23 DIAGNOSIS — M79674 Pain in right toe(s): Secondary | ICD-10-CM | POA: Diagnosis not present

## 2021-05-23 DIAGNOSIS — B372 Candidiasis of skin and nail: Secondary | ICD-10-CM

## 2021-05-23 DIAGNOSIS — M79675 Pain in left toe(s): Secondary | ICD-10-CM

## 2021-05-23 NOTE — Progress Notes (Signed)
Subjective: Shadawn Hanaway is a 70 y.o. female patient seen today in office for medication check currently on itraconazole for Candida onychomycosis.  Patient reports that it is hard to tell if her nails are doing anything but denies any problems or adverse reactions from the medication.  No other pedal complaints noted.  Patient Active Problem List   Diagnosis Date Noted   DNR (do not resuscitate) 05/14/2021   Bilateral impacted cerumen 05/14/2021   Chronic pain of both knees 05/14/2021   Aortic atherosclerosis ([place]) - per CT 05/2018 06/22/2019   GERD (gastroesophageal reflux disease) 03/04/2018   Overweight (BMI 25.0-29.9) 03/04/2018   Hypertension 02/20/2016   Osteoporosis 02/20/2016   Medication management 11/28/2013   Abnormal glucose 06/30/2013   Vitamin D deficiency 06/30/2013   Insomnia 06/30/2013   Anxiety state 06/30/2013   Hyperlipidemia     Current Outpatient Medications on File Prior to Visit  Medication Sig Dispense Refill   acyclovir (ZOVIRAX) 200 MG capsule Take 1 capsule 2 x /day as needed for Mouth Ulcers 180 capsule 3   celecoxib (CELEBREX) 100 MG capsule Take  1 capsule  Daily  with Food for Pain & Inflammation 90 capsule 3   Cholecalciferol (VITAMIN D PO) Take 5,000 Units by mouth daily.     eszopiclone (LUNESTA) 2 MG TABS tablet TAKE 1 TABLET BY MOUTH AT BEDTIME 30 tablet 0   ezetimibe (ZETIA) 10 MG tablet Take  1 tablet  Daily  for Cholesterol  / Patient knows to take by mouth 90 tablet 3   Glycerin-Polysorbate 80 (REFRESH DRY EYE THERAPY OP) Apply 1 drop to eye daily.     itraconazole (SPORANOX) 100 MG capsule Take 1 capsule (100 mg total) by mouth daily. 90 capsule 0   pantoprazole (PROTONIX) 40 MG tablet Take  1 tablet  Daily  to Prevent Heartburn and Indigestion 90 tablet 3   sucralfate (CARAFATE) 1 g tablet Dissolve 1 tab in 2-3 oz of water and drink 30 min prior to each meal and dinner (4 times a day). 120 tablet 1   vitamin C (ASCORBIC ACID) 500 MG  tablet Take 500 mg by mouth 2 (two) times daily.     vitamin E 100 UNIT capsule Take 100 Units by mouth every morning.     Zinc 50 MG TABS Take 1 tablet Daily  0   No current facility-administered medications on file prior to visit.    No Known Allergies  Objective: Physical Exam  General: Well developed, nourished, no acute distress, awake, alert and oriented x 3  Vascular: Dorsalis pedis artery 2/4 bilateral, Posterior tibial artery 2/4 bilateral, skin temperature warm to warm proximal to distal bilateral lower extremities, no varicosities, pedal hair present bilateral.   Neurological: Gross sensation present via light touch bilateral.    Dermatological: Skin is warm, dry, and supple bilateral, bilateral hallux and fifth toenails are tender, short thick, and discolored with decreasing subungual debris, no webspace macerations present bilateral, no open lesions present bilateral, no callus/corns/hyperkeratotic tissue present bilateral. No signs of infection bilateral.   Musculoskeletal: Asymptomatic bunion boney deformities noted bilateral. Muscular strength within normal limits without painon range of motion. No pain with calf compression bilateral.   Assessment and Plan:  Problem List Items Addressed This Visit   None Visit Diagnoses     Long-term use of high-risk medication    -  Primary   Candida onychomycosis       Toe pain, bilateral           -  Examined patient -Re-Discussed treatment options for painful mycotic nails re; Candida and microtrauma to nails -Previous blood work performed by PCP on 05/14/2021 reviewed and is in normal limits liver panel is good -Continue with Itracoazole until completed -Advised patient that she may also continue with fungal nail topical -Advised good hygiene habits like before -Patient to return in 6 weeks for final med check or sooner if symptoms worsen.  Landis Martins, DPM

## 2021-06-13 ENCOUNTER — Other Ambulatory Visit: Payer: Self-pay

## 2021-06-13 ENCOUNTER — Encounter: Payer: Self-pay | Admitting: Adult Health

## 2021-06-13 ENCOUNTER — Ambulatory Visit (INDEPENDENT_AMBULATORY_CARE_PROVIDER_SITE_OTHER): Payer: Medicare Other | Admitting: Adult Health

## 2021-06-13 VITALS — BP 140/80 | HR 72 | Temp 97.2°F | Wt 172.0 lb

## 2021-06-13 DIAGNOSIS — M25531 Pain in right wrist: Secondary | ICD-10-CM

## 2021-06-13 DIAGNOSIS — M65341 Trigger finger, right ring finger: Secondary | ICD-10-CM | POA: Diagnosis not present

## 2021-06-13 MED ORDER — CELECOXIB 100 MG PO CAPS: ORAL_CAPSULE | ORAL | 3 refills | Status: DC

## 2021-06-13 NOTE — Progress Notes (Signed)
Assessment and Plan:  Jeralyn was seen today for hand pain and neck pain.  Diagnoses and all orders for this visit:  Trigger ring finger of right hand Referral back to established ortho per patient preference -     Ambulatory referral to Orthopedics  Right wrist pain Rest, ice, elevate, wrap if needed; increase celebrex to BID, DO NOT TAKE WITH IBUPROFEN. Continue PPI, resume carafate, always take with food.  -     celecoxib (CELEBREX) 100 MG capsule; Take  1 capsule Twice Daily as needed with Food for Pain & Inflammation   Further disposition pending results of labs. Discussed med's effects and SE's.   Over 20 minutes of exam, counseling, chart review, and critical decision making was performed.   Future Appointments  Date Time Provider [place]  07/04/2021  4:30 PM Landis Martins, DPM [facility]  05/14/2022  4:00 PM Liane Comber, NP [facility] None    ------------------------------------------------------------------------------------------------------------------   HPI BP 140/80   Pulse 72   Temp (!) 97.2 F (36.2 C)   Wt 172 lb (78 kg)   SpO2 99%   BMI 29.52 kg/m  70 y.o.female presents for evaluation hand pain and weakness.   She is R handed; works Tax inspector. She reports hx of R 4th finger trigger finger, has seen ortho (hand specialist at Dr. Jillyn Hidden office) several years back for injections, hasn't followed up since ? 2019. She reports in the last month started noting stiff hands in the morning, also hit hand on cart yesterday and has been inflamed and tender, though already notably improved. At baseline, she is concerned about decreased grip strength.   Also R 4th digit trigger finger  She has been taking celebrex 100 mg once daily only in AM, recently added ibuprofen in PM but limited benefit and has noted upset stomach.   She is taking protonix 40 mg daily and PRN carafate, does have some dysphagia sx intermittently  Past  Medical History:  Diagnosis Date   Arthritis    Hyperlipidemia    Labile hypertension      No Known Allergies  Current Outpatient Medications on File Prior to Visit  Medication Sig   acyclovir (ZOVIRAX) 200 MG capsule Take 1 capsule 2 x /day as needed for Mouth Ulcers   celecoxib (CELEBREX) 100 MG capsule Take  1 capsule  Daily  with Food for Pain & Inflammation   Cholecalciferol (VITAMIN D PO) Take 5,000 Units by mouth daily.   eszopiclone (LUNESTA) 2 MG TABS tablet TAKE 1 TABLET BY MOUTH AT BEDTIME   ezetimibe (ZETIA) 10 MG tablet Take  1 tablet  Daily  for Cholesterol  / Patient knows to take by mouth   Glycerin-Polysorbate 80 (REFRESH DRY EYE THERAPY OP) Apply 1 drop to eye daily.   itraconazole (SPORANOX) 100 MG capsule Take 1 capsule (100 mg total) by mouth daily.   pantoprazole (PROTONIX) 40 MG tablet Take  1 tablet  Daily  to Prevent Heartburn and Indigestion   sucralfate (CARAFATE) 1 g tablet Dissolve 1 tab in 2-3 oz of water and drink 30 min prior to each meal and dinner (4 times a day).   vitamin C (ASCORBIC ACID) 500 MG tablet Take 500 mg by mouth 2 (two) times daily.   vitamin E 100 UNIT capsule Take 100 Units by mouth every morning.   Zinc 50 MG TABS Take 1 tablet Daily   No current facility-administered medications on file prior to visit.    ROS: all negative except  above.   Physical Exam:  BP 140/80   Pulse 72   Temp (!) 97.2 F (36.2 C)   Wt 172 lb (78 kg)   SpO2 99%   BMI 29.52 kg/m   General Appearance: Well nourished, well dressed AA elder female in no apparent distress. Eyes: PERRL, conjunctiva no swelling or erythema ENT/Mouth: Mask in place; Hearing normal.  Neck: Supple Respiratory: Respiratory effort normal Cardio: Appears well perfused. Brisk radial pulses and cap refill bil. Musculoskeletal: normal gait. R hand mildly inflamed, R 4th digit palmar aspect with tenderness, deep hard lump; otherwise without palpable bony abnormality, no localized  effusion, no ecchymoses. Neg phalen's. Bil hand grip is diminished, R > L without muscle wasting. Intact fine motor and sensation.  Skin: Warm, dry without rashes, lesions, ecchymosis.  Neuro: Normal muscle tone, bil hand fine motor and sensation intact.  Psych: Awake and oriented X 3, normal affect, Insight and Judgment appropriate.    Izora Ribas, NP 4:39 PM [facility]

## 2021-06-21 ENCOUNTER — Other Ambulatory Visit: Payer: Self-pay | Admitting: Adult Health

## 2021-06-21 DIAGNOSIS — G4700 Insomnia, unspecified: Secondary | ICD-10-CM

## 2021-06-24 DIAGNOSIS — Z20822 Contact with and (suspected) exposure to covid-19: Secondary | ICD-10-CM | POA: Diagnosis not present

## 2021-07-04 ENCOUNTER — Encounter: Payer: Self-pay | Admitting: Sports Medicine

## 2021-07-04 ENCOUNTER — Ambulatory Visit (INDEPENDENT_AMBULATORY_CARE_PROVIDER_SITE_OTHER): Payer: Medicare Other | Admitting: Sports Medicine

## 2021-07-04 ENCOUNTER — Other Ambulatory Visit: Payer: Self-pay

## 2021-07-04 DIAGNOSIS — Z79899 Other long term (current) drug therapy: Secondary | ICD-10-CM | POA: Diagnosis not present

## 2021-07-04 DIAGNOSIS — B372 Candidiasis of skin and nail: Secondary | ICD-10-CM

## 2021-07-04 DIAGNOSIS — M79674 Pain in right toe(s): Secondary | ICD-10-CM

## 2021-07-04 DIAGNOSIS — M79675 Pain in left toe(s): Secondary | ICD-10-CM | POA: Diagnosis not present

## 2021-07-04 DIAGNOSIS — M792 Neuralgia and neuritis, unspecified: Secondary | ICD-10-CM | POA: Diagnosis not present

## 2021-07-04 NOTE — Patient Instructions (Signed)
Nervive nerve relief supplement for your nerves can be purchased OTC at [facility]  Try lidoderm patch on your feet as needed for burning pain or OTC Capsician cream.   If burning pain gets worse see if your PCP has any other recommendations

## 2021-07-04 NOTE — Progress Notes (Signed)
Subjective: Susan Carr is a 70 y.o. female patient seen today in office for medication check after finishing itraconazole for Candida onychomycosis.  Patient reports that her nails look much better. Reports that she has been having worsening burning pain to both feet. No other pedal complaints noted.  Patient Active Problem List   Diagnosis Date Noted   DNR (do not resuscitate) 05/14/2021   Bilateral impacted cerumen 05/14/2021   Chronic pain of both knees 05/14/2021   Aortic atherosclerosis ([place]) - per CT 05/2018 06/22/2019   GERD (gastroesophageal reflux disease) 03/04/2018   Overweight (BMI 25.0-29.9) 03/04/2018   Hypertension 02/20/2016   Osteoporosis 02/20/2016   Medication management 11/28/2013   Abnormal glucose 06/30/2013   Vitamin D deficiency 06/30/2013   Insomnia 06/30/2013   Anxiety state 06/30/2013   Hyperlipidemia     Current Outpatient Medications on File Prior to Visit  Medication Sig Dispense Refill   acyclovir (ZOVIRAX) 200 MG capsule Take 1 capsule 2 x /day as needed for Mouth Ulcers 180 capsule 3   celecoxib (CELEBREX) 100 MG capsule Take  1 capsule Twice Daily as needed with Food for Pain & Inflammation 180 capsule 3   Cholecalciferol (VITAMIN D PO) Take 5,000 Units by mouth daily.     eszopiclone (LUNESTA) 2 MG TABS tablet TAKE 1 TABLET(2 MG) BY MOUTH AT BEDTIME AS NEEDED FOR SLEEP 30 tablet 0   ezetimibe (ZETIA) 10 MG tablet Take  1 tablet  Daily  for Cholesterol  / Patient knows to take by mouth 90 tablet 3   Glycerin-Polysorbate 80 (REFRESH DRY EYE THERAPY OP) Apply 1 drop to eye daily.     itraconazole (SPORANOX) 100 MG capsule Take 1 capsule (100 mg total) by mouth daily. 90 capsule 0   pantoprazole (PROTONIX) 40 MG tablet Take  1 tablet  Daily  to Prevent Heartburn and Indigestion 90 tablet 3   sucralfate (CARAFATE) 1 g tablet Dissolve 1 tab in 2-3 oz of water and drink 30 min prior to each meal and dinner (4 times a day). 120 tablet 1   vitamin C  (ASCORBIC ACID) 500 MG tablet Take 500 mg by mouth 2 (two) times daily.     vitamin E 100 UNIT capsule Take 100 Units by mouth every morning.     Zinc 50 MG TABS Take 1 tablet Daily  0   No current facility-administered medications on file prior to visit.    No Known Allergies  Objective: Physical Exam  General: Well developed, nourished, no acute distress, awake, alert and oriented x 3  Vascular: Dorsalis pedis artery 2/4 bilateral, Posterior tibial artery 2/4 bilateral, skin temperature warm to warm proximal to distal bilateral lower extremities, no varicosities, pedal hair present bilateral.   Neurological: Gross sensation present via light touch bilateral. Subjective burning pain to both feet.    Dermatological: Skin is warm, dry, and supple bilateral, bilateral hallux and fifth toenails are almost completely normal with very minimal distal thickening, no webspace macerations present bilateral, no open lesions present bilateral, no callus/corns/hyperkeratotic tissue present bilateral. No signs of infection bilateral.   Musculoskeletal: Asymptomatic bunion boney deformities noted bilateral. Muscular strength within normal limits without painon range of motion. No pain with calf compression bilateral.   Assessment and Plan:  Problem List Items Addressed This Visit   None Visit Diagnoses     Candida onychomycosis    -  Primary   Long-term use of high-risk medication       Toe pain, bilateral  Neuritis           -Examined patient -Discussed long term care of nails -Patient has completed Itraconazole; advised patient that we will continue to monitor her nails as they grow out -Recommend nervive nerve relief for burning pain and to try lidoderm pain patch if needed and if symptoms worsens may want to discuss with PCP further work up for Neuropathy -Patient to return PRN or sooner if symptoms worsen.  Landis Martins, DPM

## 2021-07-11 ENCOUNTER — Encounter: Payer: Medicare Other | Admitting: Adult Health Nurse Practitioner

## 2021-07-22 ENCOUNTER — Other Ambulatory Visit: Payer: Self-pay | Admitting: Nurse Practitioner

## 2021-07-22 DIAGNOSIS — G4700 Insomnia, unspecified: Secondary | ICD-10-CM

## 2021-07-25 DIAGNOSIS — M65341 Trigger finger, right ring finger: Secondary | ICD-10-CM | POA: Diagnosis not present

## 2021-08-20 ENCOUNTER — Other Ambulatory Visit: Payer: Self-pay | Admitting: Nurse Practitioner

## 2021-08-20 DIAGNOSIS — G4700 Insomnia, unspecified: Secondary | ICD-10-CM

## 2021-08-21 ENCOUNTER — Encounter: Payer: Self-pay | Admitting: Internal Medicine

## 2021-08-26 ENCOUNTER — Other Ambulatory Visit: Payer: Self-pay

## 2021-08-26 DIAGNOSIS — G4700 Insomnia, unspecified: Secondary | ICD-10-CM

## 2021-08-26 MED ORDER — ESZOPICLONE 2 MG PO TABS: ORAL_TABLET | ORAL | 0 refills | Status: DC

## 2021-08-29 DIAGNOSIS — M65341 Trigger finger, right ring finger: Secondary | ICD-10-CM | POA: Diagnosis not present

## 2021-09-04 ENCOUNTER — Ambulatory Visit: Payer: Medicare Other | Admitting: Adult Health

## 2021-09-05 ENCOUNTER — Ambulatory Visit (INDEPENDENT_AMBULATORY_CARE_PROVIDER_SITE_OTHER): Payer: Medicare Other | Admitting: Adult Health

## 2021-09-05 ENCOUNTER — Other Ambulatory Visit: Payer: Self-pay

## 2021-09-05 ENCOUNTER — Encounter: Payer: Self-pay | Admitting: Adult Health

## 2021-09-05 VITALS — BP 138/80 | HR 79 | Temp 97.3°F | Wt 175.0 lb

## 2021-09-05 DIAGNOSIS — R252 Cramp and spasm: Secondary | ICD-10-CM

## 2021-09-05 NOTE — Progress Notes (Signed)
Assessment and Plan:  Mashawn was seen today for leg cramps.  Diagnoses and all orders for this visit:  Nocturnal muscle cramp Since episode in each thigh 2 weeks ago without recurrence No other concerning sx, benign exam  Low risk meds Low suspicion that labs would reveal anything considering lack of recurrence in several weeks; defer today, she is in agreement  Discussed avoiding dehydration, plenty of fruits and veggies for electrolytes, regular exercise (gentle walking) and stretching.  Follow up if recurrent   Further disposition pending results of labs. Discussed med's effects and SE's.   Over 15 minutes of exam, counseling, chart review, and critical decision making was performed.   Future Appointments  Date Time Provider [place]  09/26/2021  3:45 PM Landis Martins, DPM [facility]  05/14/2022  4:00 PM Liane Comber, NP [facility] None    ------------------------------------------------------------------------------------------------------------------   HPI BP 138/80    Pulse 79    Temp (!) 97.3 F (36.3 C)    Wt 175 lb (79.4 kg)    SpO2 99%    BMI 30.04 kg/m  71 y.o.female presents for evaluation of leg cramps.   She reports 2-3 weeks ago, she had a severe cramp in each of her thighs, single episode lasting several minutes, resolved after taking tsp of mustard followed by gatorade, muscle was sore for several days following. Denies recurrence in the last 2 weeks.   No new meds, or concerning sx; she doesn't recall any dehydration. Not using "no salt." No high risk meds. Denies new exercise program. Does have ongoing intermittent bil knee pain. Denies back pain, hip pain. Denies recent hx of iron def, denies fatigue, dizziness, fatigue, blood in stool or black stools.    Past Medical History:  Diagnosis Date   Arthritis    Hyperlipidemia    Labile hypertension      No Known Allergies  Current Outpatient Medications on File Prior to Visit   Medication Sig   acyclovir (ZOVIRAX) 200 MG capsule Take 1 capsule 2 x /day as needed for Mouth Ulcers   celecoxib (CELEBREX) 100 MG capsule Take  1 capsule Twice Daily as needed with Food for Pain & Inflammation   Cholecalciferol (VITAMIN D PO) Take 5,000 Units by mouth daily.   eszopiclone (LUNESTA) 2 MG TABS tablet Take immediately before bedtime   ezetimibe (ZETIA) 10 MG tablet Take  1 tablet  Daily  for Cholesterol  / Patient knows to take by mouth   Glycerin-Polysorbate 80 (REFRESH DRY EYE THERAPY OP) Apply 1 drop to eye daily.   itraconazole (SPORANOX) 100 MG capsule Take 1 capsule (100 mg total) by mouth daily.   pantoprazole (PROTONIX) 40 MG tablet Take  1 tablet  Daily  to Prevent Heartburn and Indigestion   sucralfate (CARAFATE) 1 g tablet Dissolve 1 tab in 2-3 oz of water and drink 30 min prior to each meal and dinner (4 times a day).   vitamin C (ASCORBIC ACID) 500 MG tablet Take 500 mg by mouth 2 (two) times daily.   vitamin E 100 UNIT capsule Take 100 Units by mouth every morning.   Zinc 50 MG TABS Take 1 tablet Daily   No current facility-administered medications on file prior to visit.    ROS: all negative except above.   Physical Exam:  BP 138/80    Pulse 79    Temp (!) 97.3 F (36.3 C)    Wt 175 lb (79.4 kg)    SpO2 99%    BMI  30.04 kg/m   General Appearance: Well nourished, in no apparent distress. Eyes: PERRLA, conjunctiva no swelling or erythema ENT/Mouth: mask in place; Hearing normal.  Neck: Supple, thyroid normal.  Respiratory: Respiratory effort normal, BS equal bilaterally without rales, rhonchi, wheezing or stridor.  Cardio: RRR with no MRGs. Brisk peripheral pulses without edema.  Abdomen: Soft, + BS.  Non tender, no guarding, rebound, hernias, masses. Lymphatics: Non tender without lymphadenopathy.  Musculoskeletal: Full ROM all peripheral extremities, 5/5 strength, and normal gait. No effusions or obvious deformity. No muscle spasm. Neg straigth leg  raise. Skin: Warm, dry without rashes, lesions, ecchymosis.  Neuro: Normal muscle tone, bil LE neuro exam and reflexes normal  Psych: Awake and oriented X 3, normal affect, Insight and Judgment appropriate.   Izora Ribas, NP 4:52 PM [facility]

## 2021-09-26 ENCOUNTER — Ambulatory Visit (INDEPENDENT_AMBULATORY_CARE_PROVIDER_SITE_OTHER): Payer: Medicare Other

## 2021-09-26 ENCOUNTER — Other Ambulatory Visit: Payer: Self-pay | Admitting: Adult Health

## 2021-09-26 ENCOUNTER — Encounter: Payer: Self-pay | Admitting: Sports Medicine

## 2021-09-26 ENCOUNTER — Ambulatory Visit (INDEPENDENT_AMBULATORY_CARE_PROVIDER_SITE_OTHER): Payer: Medicare Other | Admitting: Sports Medicine

## 2021-09-26 ENCOUNTER — Telehealth: Payer: Self-pay | Admitting: Adult Health

## 2021-09-26 ENCOUNTER — Other Ambulatory Visit: Payer: Self-pay

## 2021-09-26 DIAGNOSIS — M79674 Pain in right toe(s): Secondary | ICD-10-CM | POA: Diagnosis not present

## 2021-09-26 DIAGNOSIS — M2041 Other hammer toe(s) (acquired), right foot: Secondary | ICD-10-CM

## 2021-09-26 DIAGNOSIS — G4700 Insomnia, unspecified: Secondary | ICD-10-CM

## 2021-09-26 DIAGNOSIS — L84 Corns and callosities: Secondary | ICD-10-CM | POA: Diagnosis not present

## 2021-09-26 MED ORDER — ESZOPICLONE 2 MG PO TABS: ORAL_TABLET | ORAL | 0 refills | Status: DC

## 2021-09-26 NOTE — Progress Notes (Signed)
Future Appointments  ?Date Time Provider [place]  ?05/14/2022  4:00 PM Liane Comber, NP [facility] None  ? ? ?PDMP reviewed for lunestra refill request.  ? ?

## 2021-09-26 NOTE — Telephone Encounter (Signed)
Patient is requesting a refill on Lunesta to [facility] on [place] ?

## 2021-09-26 NOTE — Progress Notes (Signed)
Subjective: ?Susan Carr is a 71 y.o. female patient who presents to office for evaluation of Right fourth toe pain.  Patient reports that the toe has been getting worse over the last several weeks very tender to touch sometimes after 10/27/2017 at the distal femur tip of the toe.  Patient denies any known trauma or injury but states that he has been suffering with increasing right knee pain.  No other pedal complaints noted. ? ?Patient Active Problem List  ? Diagnosis Date Noted  ? DNR (do not resuscitate) 05/14/2021  ? Bilateral impacted cerumen 05/14/2021  ? Chronic pain of both knees 05/14/2021  ? Aortic atherosclerosis ([place]) - per CT 05/2018 06/22/2019  ? GERD (gastroesophageal reflux disease) 03/04/2018  ? Overweight (BMI 25.0-29.9) 03/04/2018  ? Hypertension 02/20/2016  ? Osteoporosis 02/20/2016  ? Medication management 11/28/2013  ? Abnormal glucose 06/30/2013  ? Vitamin D deficiency 06/30/2013  ? Insomnia 06/30/2013  ? Anxiety state 06/30/2013  ? Hyperlipidemia   ? ? ?Current Outpatient Medications on File Prior to Visit  ?Medication Sig Dispense Refill  ? omeprazole (PRILOSEC) 20 MG capsule 1 capsule    ? acyclovir (ZOVIRAX) 200 MG capsule Take 1 capsule 2 x /day as needed for Mouth Ulcers 180 capsule 3  ? alendronate (FOSAMAX) 70 MG tablet 1 tablet    ? celecoxib (CELEBREX) 100 MG capsule Take  1 capsule Twice Daily as needed with Food for Pain & Inflammation 180 capsule 3  ? Cholecalciferol (VITAMIN D PO) Take 5,000 Units by mouth daily.    ? eszopiclone (LUNESTA) 2 MG TABS tablet Take immediately before bedtime 30 tablet 0  ? ezetimibe (ZETIA) 10 MG tablet Take  1 tablet  Daily  for Cholesterol  / Patient knows to take by mouth 90 tablet 3  ? gabapentin (NEURONTIN) 100 MG capsule 1 tablet    ? Glycerin-Polysorbate 80 (REFRESH DRY EYE THERAPY OP) Apply 1 drop to eye daily.    ? itraconazole (SPORANOX) 100 MG capsule Take 1 capsule (100 mg total) by mouth daily. 90 capsule 0  ? pantoprazole  (PROTONIX) 40 MG tablet Take  1 tablet  Daily  to Prevent Heartburn and Indigestion 90 tablet 3  ? sucralfate (CARAFATE) 1 g tablet Dissolve 1 tab in 2-3 oz of water and drink 30 min prior to each meal and dinner (4 times a day). 120 tablet 1  ? vitamin C (ASCORBIC ACID) 500 MG tablet Take 500 mg by mouth 2 (two) times daily.    ? vitamin E 100 UNIT capsule Take 100 Units by mouth every morning.    ? Zinc 50 MG TABS Take 1 tablet Daily  0  ? ?No current facility-administered medications on file prior to visit.  ? ? ?No Known Allergies ? ?Objective:  ?General: Alert and oriented x3 in no acute distress ? ?Dermatology: Keratotic lesion present distal tuft of the right fourth toe with skin lines transversing the lesion, pain is present with direct pressure to the lesion with a central nucleated core noted, no webspace macerations, no ecchymosis bilateral, all nails x 10 are short and thick but well manicured. ? ?Vascular: Dorsalis Pedis and Posterior Tibial pedal pulses 2/4, Capillary Fill Time 3 seconds, + pedal hair growth bilateral, no edema bilateral lower extremities, Temperature gradient within normal limits. ? ?Neurology: Gross sensation intact via light touch bilateral.  Subjective burning pain to both feet. ? ?Musculoskeletal: Mild tenderness with palpation at the keratotic lesion site on Right fourth toe with significant hammertoe deformity  noted with right fourth and fifth toes most contracted. ? ?Assessment and Plan: ?Problem List Items Addressed This Visit   ?None ?Visit Diagnoses   ? ? Hammertoe of right foot    -  Primary  ? Relevant Orders  ? DG Foot Complete Right  ? Corn of toe      ? Toe pain, right      ? ?  ? ? ?-Complete examination performed ?-X-rays reviewed ?-Discussed treatment options for painful corn at the distal tuft of the right fourth hammertoe ?-Complimentary parred keratoic lesion at the right fourth toe using a 15 blade; advised patient to continue with daily skin emollients like  Vaseline bath or shower to the affected area ?-Dispensed toe crest or toe cap for patient to use as directed ?-Advised good supportive shoes for foot type does not ground toes ?-Advised patient if her knee pain or burning pain in which I think is not related worsens to discuss with PCP or orthopedic ?-Patient to return to office as needed or sooner if condition worsens. ? ?Landis Martins, DPM ?

## 2021-10-24 ENCOUNTER — Other Ambulatory Visit: Payer: Self-pay | Admitting: Adult Health

## 2021-10-24 DIAGNOSIS — G4700 Insomnia, unspecified: Secondary | ICD-10-CM

## 2021-11-08 ENCOUNTER — Other Ambulatory Visit: Payer: Self-pay | Admitting: Adult Health

## 2021-11-19 DIAGNOSIS — R102 Pelvic and perineal pain: Secondary | ICD-10-CM | POA: Diagnosis not present

## 2021-11-19 DIAGNOSIS — N898 Other specified noninflammatory disorders of vagina: Secondary | ICD-10-CM | POA: Diagnosis not present

## 2021-11-19 DIAGNOSIS — M545 Low back pain, unspecified: Secondary | ICD-10-CM | POA: Diagnosis not present

## 2021-11-26 ENCOUNTER — Other Ambulatory Visit: Payer: Self-pay | Admitting: Adult Health

## 2021-11-26 ENCOUNTER — Telehealth: Payer: Self-pay

## 2021-11-26 DIAGNOSIS — G4700 Insomnia, unspecified: Secondary | ICD-10-CM

## 2021-11-26 MED ORDER — ESZOPICLONE 2 MG PO TABS: ORAL_TABLET | ORAL | 0 refills | Status: DC

## 2021-11-26 NOTE — Telephone Encounter (Signed)
Lunesta refill request.  ?

## 2021-11-26 NOTE — Progress Notes (Signed)
Future Appointments  ?Date Time Provider [place]  ?11/28/2021  3:30 PM Liane Comber, NP [facility] None  ?05/14/2022  4:00 PM Liane Comber, NP [facility] None  ? ? ?PDMP reviewed for lunestra refill request.  ? ?

## 2021-11-28 ENCOUNTER — Encounter: Payer: Self-pay | Admitting: Adult Health

## 2021-11-28 ENCOUNTER — Other Ambulatory Visit: Payer: Self-pay | Admitting: Adult Health

## 2021-11-28 ENCOUNTER — Ambulatory Visit (INDEPENDENT_AMBULATORY_CARE_PROVIDER_SITE_OTHER): Payer: Medicare Other | Admitting: Adult Health

## 2021-11-28 VITALS — BP 138/80 | HR 70 | Temp 97.2°F | Wt 174.0 lb

## 2021-11-28 DIAGNOSIS — L918 Other hypertrophic disorders of the skin: Secondary | ICD-10-CM | POA: Diagnosis not present

## 2021-11-28 DIAGNOSIS — L819 Disorder of pigmentation, unspecified: Secondary | ICD-10-CM | POA: Diagnosis not present

## 2021-11-28 DIAGNOSIS — G4700 Insomnia, unspecified: Secondary | ICD-10-CM | POA: Diagnosis not present

## 2021-11-28 DIAGNOSIS — L659 Nonscarring hair loss, unspecified: Secondary | ICD-10-CM

## 2021-11-28 MED ORDER — ESZOPICLONE 2 MG PO TABS: ORAL_TABLET | ORAL | 0 refills | Status: DC

## 2021-11-28 NOTE — Progress Notes (Signed)
Assessment and Plan:  Susan Carr was seen today for nevus.  Diagnoses and all orders for this visit:  Insomnia, unspecified type -     eszopiclone (LUNESTA) 2 MG TABS tablet; TAKE ONE TABLET BY MOUTH IMMEDIATELY BEFORE BEDTIME  Hair loss Tension type, possible mild androgenic type loss as well  Avoid hair styles with hair pulled back, tight braids - referral to derm per patient preference  Pigmented skin lesions Several with similar characteristics, discussed most likely benign, age related lesions, low suspicion of malignant etiology  Patient preference to proceed with derm evaluation due to several concerns  - referral to D. Jamse Belfast per patient request   Multiple acquired skin tags Referral to derm but we could also remove in office if desired;  Follow up to schedule in procedure room if prefers to proceed   Further disposition pending results of labs. Discussed med's effects and SE's.   Over 15 minutes of exam, counseling, chart review, and critical decision making was performed.   Future Appointments  Date Time Provider [place]  05/14/2022  4:00 PM Liane Comber, NP [facility] None    ------------------------------------------------------------------------------------------------------------------   HPI BP 138/80   Pulse 70   Temp (!) 97.2 F (36.2 C)   Wt 174 lb (78.9 kg)   SpO2 98%   BMI 29.87 kg/m  71 y.o.female presents requesting referral to derm. She has some receding hairline and several nevi on scalp, skin tags of R axilla; requesting referral to Dr. Jamse Belfast.   Past Medical History:  Diagnosis Date   Arthritis    Hyperlipidemia    Labile hypertension      No Known Allergies  Current Outpatient Medications on File Prior to Visit  Medication Sig   acyclovir (ZOVIRAX) 200 MG capsule Take 1 capsule 2 x /day as needed for Mouth Ulcers   alendronate (FOSAMAX) 70 MG tablet 1 tablet   celecoxib (CELEBREX) 100 MG capsule Take  1  capsule Twice Daily as needed with Food for Pain & Inflammation   Cholecalciferol (VITAMIN D PO) Take 5,000 Units by mouth daily.   eszopiclone (LUNESTA) 2 MG TABS tablet TAKE ONE TABLET BY MOUTH IMMEDIATELY BEFORE BEDTIME   ezetimibe (ZETIA) 10 MG tablet Take  1 tablet  Daily  for Cholesterol  / Patient knows to take by mouth   gabapentin (NEURONTIN) 100 MG capsule 1 tablet   Glycerin-Polysorbate 80 (REFRESH DRY EYE THERAPY OP) Apply 1 drop to eye daily.   itraconazole (SPORANOX) 100 MG capsule Take 1 capsule (100 mg total) by mouth daily.   omeprazole (PRILOSEC) 20 MG capsule 1 capsule   pantoprazole (PROTONIX) 40 MG tablet Take  1 tablet  Daily  to Prevent Heartburn and Indigestion   sucralfate (CARAFATE) 1 g tablet DISSOLVE 1 TABLET IN 2-3 OZ WATER AND DRINK 30 MINUTES PRIOR TO EACH MEAL AND DINNER(4 TIMES DAILY)   vitamin C (ASCORBIC ACID) 500 MG tablet Take 500 mg by mouth 2 (two) times daily.   vitamin E 100 UNIT capsule Take 100 Units by mouth every morning.   Zinc 50 MG TABS Take 1 tablet Daily   No current facility-administered medications on file prior to visit.    ROS: all negative except above.   Physical Exam:  BP 138/80   Pulse 70   Temp (!) 97.2 F (36.2 C)   Wt 174 lb (78.9 kg)   SpO2 98%   BMI 29.87 kg/m   General Appearance: Well nourished, in no apparent distress. Eyes: PERRLA,  conjunctiva no swelling or erythema ENT/Mouth: mask in place; Hearing normal.  Neck: Supple, thyroid normal.  Respiratory: Respiratory effort normal Cardio: Appears well perfused Lymphatics: Non tender without lymphadenopathy.  Musculoskeletal: no obvious deformity; normal gait.  Skin: Warm, dry without rashes, ecchymosis. R axilla with 3 skin tags, non-inflamed, single warty nevi. Receding scalp hairline with tension pattern, mild androgenic thinning pattern. Several dark nevi along hair border, flat, even tone, 0.6+ cm with variable edging.  Neuro: Normal muscle tone Psych: Awake  and oriented X 3, normal affect, Insight and Judgment appropriate.      Izora Ribas, NP 3:56 PM [facility]

## 2021-12-10 DIAGNOSIS — R102 Pelvic and perineal pain: Secondary | ICD-10-CM | POA: Diagnosis not present

## 2021-12-26 ENCOUNTER — Other Ambulatory Visit: Payer: Self-pay

## 2021-12-26 ENCOUNTER — Other Ambulatory Visit: Payer: Self-pay | Admitting: Internal Medicine

## 2021-12-26 ENCOUNTER — Other Ambulatory Visit: Payer: Self-pay | Admitting: Adult Health

## 2021-12-26 ENCOUNTER — Telehealth: Payer: Self-pay | Admitting: Adult Health

## 2021-12-26 DIAGNOSIS — B029 Zoster without complications: Secondary | ICD-10-CM

## 2021-12-26 DIAGNOSIS — G4700 Insomnia, unspecified: Secondary | ICD-10-CM

## 2021-12-26 MED ORDER — ACYCLOVIR 200 MG PO CAPS: ORAL_CAPSULE | ORAL | 3 refills | Status: DC

## 2021-12-26 NOTE — Telephone Encounter (Signed)
Pt is requesting a refill on Acyclovir to go to [facility] on [address].

## 2022-01-26 ENCOUNTER — Other Ambulatory Visit: Payer: Self-pay | Admitting: Internal Medicine

## 2022-01-26 DIAGNOSIS — G4700 Insomnia, unspecified: Secondary | ICD-10-CM

## 2022-01-26 MED ORDER — ESZOPICLONE 2 MG PO TABS: ORAL_TABLET | ORAL | 0 refills | Status: DC

## 2022-02-26 ENCOUNTER — Other Ambulatory Visit: Payer: Self-pay | Admitting: Internal Medicine

## 2022-02-26 DIAGNOSIS — G4700 Insomnia, unspecified: Secondary | ICD-10-CM

## 2022-02-28 DIAGNOSIS — M2241 Chondromalacia patellae, right knee: Secondary | ICD-10-CM | POA: Diagnosis not present

## 2022-02-28 DIAGNOSIS — M65341 Trigger finger, right ring finger: Secondary | ICD-10-CM | POA: Diagnosis not present

## 2022-02-28 DIAGNOSIS — M2242 Chondromalacia patellae, left knee: Secondary | ICD-10-CM | POA: Diagnosis not present

## 2022-03-02 ENCOUNTER — Emergency Department (HOSPITAL_COMMUNITY)
Admission: EM | Admit: 2022-03-02 | Discharge: 2022-03-02 | Discharge status: Against Medical Advice | Payer: Medicare Other | Attending: Student | Admitting: Student

## 2022-03-02 ENCOUNTER — Emergency Department (HOSPITAL_COMMUNITY)
Admission: EM | Admit: 2022-03-02 | Discharge: 2022-03-03 | Disposition: A | Discharge status: Home / Self Care | Payer: Medicare Other | Source: Home / Self Care | Attending: Emergency Medicine | Admitting: Emergency Medicine

## 2022-03-02 ENCOUNTER — Other Ambulatory Visit: Payer: Self-pay

## 2022-03-02 ENCOUNTER — Emergency Department (HOSPITAL_COMMUNITY): Payer: Medicare Other

## 2022-03-02 DIAGNOSIS — J9811 Atelectasis: Secondary | ICD-10-CM | POA: Diagnosis not present

## 2022-03-02 DIAGNOSIS — R112 Nausea with vomiting, unspecified: Secondary | ICD-10-CM | POA: Insufficient documentation

## 2022-03-02 DIAGNOSIS — G4489 Other headache syndrome: Secondary | ICD-10-CM | POA: Diagnosis not present

## 2022-03-02 DIAGNOSIS — R1013 Epigastric pain: Secondary | ICD-10-CM | POA: Diagnosis not present

## 2022-03-02 DIAGNOSIS — R5381 Other malaise: Secondary | ICD-10-CM | POA: Insufficient documentation

## 2022-03-02 DIAGNOSIS — R0789 Other chest pain: Secondary | ICD-10-CM | POA: Diagnosis not present

## 2022-03-02 DIAGNOSIS — I10 Essential (primary) hypertension: Secondary | ICD-10-CM | POA: Insufficient documentation

## 2022-03-02 DIAGNOSIS — R1110 Vomiting, unspecified: Secondary | ICD-10-CM | POA: Insufficient documentation

## 2022-03-02 DIAGNOSIS — Z5321 Procedure and treatment not carried out due to patient leaving prior to being seen by health care provider: Secondary | ICD-10-CM | POA: Diagnosis not present

## 2022-03-02 DIAGNOSIS — R079 Chest pain, unspecified: Secondary | ICD-10-CM

## 2022-03-02 DIAGNOSIS — R52 Pain, unspecified: Secondary | ICD-10-CM | POA: Diagnosis not present

## 2022-03-02 DIAGNOSIS — R1111 Vomiting without nausea: Secondary | ICD-10-CM | POA: Diagnosis not present

## 2022-03-02 LAB — BASIC METABOLIC PANEL
Anion gap: 6 (ref 5–15)
BUN: 12 mg/dL (ref 8–23)
CO2: 29 mmol/L (ref 22–32)
Calcium: 9.5 mg/dL (ref 8.9–10.3)
Chloride: 106 mmol/L (ref 98–111)
Creatinine, Ser: 0.86 mg/dL (ref 0.44–1.00)
GFR, Estimated: >60 mL/min (ref >60)
Glucose, Bld: 129 mg/dL — ABNORMAL HIGH (ref 70–99)
Potassium: 3.6 mmol/L (ref 3.5–5.1)
Sodium: 141 mmol/L (ref 135–145)

## 2022-03-02 LAB — CBC
HCT: 40 % (ref 36.0–46.0)
Hemoglobin: 13.3 g/dL (ref 12.0–15.0)
MCH: 31.8 pg (ref 26.0–34.0)
MCHC: 33.3 g/dL (ref 30.0–36.0)
MCV: 95.7 fL (ref 80.0–100.0)
Platelets: 264 10*3/uL (ref 150–400)
RBC: 4.18 MIL/uL (ref 3.87–5.11)
RDW: 12.5 % (ref 11.5–15.5)
WBC: 6.8 10*3/uL (ref 4.0–10.5)
nRBC: 0 % (ref 0.0–0.2)

## 2022-03-02 LAB — LIPASE, BLOOD: Lipase: 30 U/L (ref 11–51)

## 2022-03-02 LAB — TROPONIN I (HIGH SENSITIVITY): Troponin I (High Sensitivity): 3 ng/L (ref <18)

## 2022-03-02 NOTE — ED Triage Notes (Signed)
Patient coming to ED for evaluation of epigastric abdominal pain.  Reports pain started suddenly.  Worse with eating.  Has had nausea and vomiting.  No reports of diarrhea.  Brought here by family after being taken to [facility] by EMS.  Was given Zofran 4 mg IV PTA.  Left due to length of wait.

## 2022-03-02 NOTE — ED Notes (Signed)
Patient states the wait is too long and she is leaving. EMS IV removed by this NT

## 2022-03-02 NOTE — ED Triage Notes (Signed)
Pt here from home for epigastric/chest pain and emesis that started today at 1400, worse w/ eating. Pt reports 3 episodes of emesis, 2 at home and 1 w/ ems. Ems gave '324mg'$  ASA but pt had episode of emesis after. Ems gave '4mg'$  zofran. 158/94, 80HR, 16RR, 99% RA.

## 2022-03-03 MED ORDER — ALUM & MAG HYDROXIDE-SIMETH 200-200-20 MG/5ML PO SUSP: 30.0000 mL | Freq: Once | ORAL | Status: DC

## 2022-03-03 NOTE — Discharge Instructions (Signed)
You were evaluated in the Emergency Department and after careful evaluation, we did not find any emergent condition requiring admission or further testing in the hospital.  Your exam/testing today is overall reassuring.  Recommend close follow-up with your primary care doctor to discuss your symptoms.  Please return to the Emergency Department if you experience any worsening of your condition.   Thank you for allowing [place] to be a part of your care.

## 2022-03-03 NOTE — ED Provider Notes (Signed)
[facility] Emergency Department Provider Note MRN:  [id number]  Arrival date & time: 03/03/22     Chief Complaint   Chest pain History of Present Illness   Susan Carr is a 71 y.o. year-old female with a history of hyperlipidemia presenting to the ED with chief complaint of chest pain.  After dinner patient experienced pain to the bilateral chest, malaise, nausea, single episode of vomiting.  Tried some of her antiacid medications but did not help.  Feeling a lot better at this time.  No shortness of breath, no leg pain or swelling, no abdominal pain.  Review of Systems  A thorough review of systems was obtained and all systems are negative except as noted in the HPI and PMH.   Patient's Health History    Past Medical History:  Diagnosis Date   Arthritis    Hyperlipidemia    Labile hypertension     Past Surgical History:  Procedure Laterality Date   NECK SURGERY     REDUCTION MAMMAPLASTY Bilateral 2007    Family History  Problem Relation Age of Onset   Diabetes Other    Multiple sclerosis Other    Heart attack Mother    Diabetes Mother    Heart attack Father     Social History   Socioeconomic History   Marital status: Widowed    Spouse name: Not on file   Number of children: Not on file   Years of education: Not on file   Highest education level: Not on file  Occupational History   Not on file  Tobacco Use   Smoking status: Never   Smokeless tobacco: Never  Substance and Sexual Activity   Alcohol use: No   Drug use: No   Sexual activity: Not on file  Other Topics Concern   Not on file  Social History Narrative   Not on file   Social Determinants of Health   Financial Resource Strain: Not on file  Food Insecurity: Not on file  Transportation Needs: Not on file  Physical Activity: Not on file  Stress: Not on file  Social Connections: Not on file  Intimate Partner Violence: Not on file     Physical Exam   Vitals:    03/02/22 2330 03/02/22 2349  BP:  (!) 167/79  Pulse: 70 74  Resp:    Temp:    SpO2:  100%    CONSTITUTIONAL: Well-appearing, NAD NEURO/PSYCH:  Alert and oriented x 3, no focal deficits EYES:  eyes equal and reactive ENT/NECK:  no LAD, no JVD CARDIO: Regular rate, well-perfused, normal S1 and S2 PULM:  CTAB no wheezing or rhonchi GI/GU:  non-distended, non-tender MSK/SPINE:  No gross deformities, no edema SKIN:  no rash, atraumatic   *Additional and/or pertinent findings included in MDM below  Diagnostic and Interventional Summary    EKG Interpretation  Date/Time:  March 02, 2022 at 21:15:31 Ventricular Rate:   66 PR Interval:   166 QRS Duration:  78 QT Interval: 422   QTC Calculation:442   R Axis:     Text Interpretation: Sinus rhythm, no concerning ischemic features       Labs Reviewed - No data to display  No orders to display    Medications - No data to display   Procedures  /  Critical Care Procedures  ED Course and Medical Decision Making  Initial Impression and Ddx Chest pain after eating, resolved at this time.  No significant cardiovascular risk factors.  Vital signs  normal, largely asymptomatic, abdomen benign, favoring GI cause, ACS is considered as well.  Patient coming [facility] from [facility], extended wait times.  Her work-up there demonstrating normal labs, negative troponin, EKG reassuring, normal chest x-ray.  We discussed the pros and cons of a second troponin, declined by patient who wants to go home, feeling better, she will follow-up with PCP.  Past medical/surgical history that increases complexity of ED encounter: None  Interpretation of Diagnostics I personally reviewed the EKG and my interpretation is as follows: Sinus rhythm without concerning ischemic features    Patient Reassessment and Ultimate Disposition/Management     Discharge  Patient management required discussion with the following services or consulting groups:   None  Complexity of Problems Addressed Acute illness or injury that poses threat of life of bodily function  Additional Data Reviewed and Analyzed Further history obtained from: Further history from spouse/family member  Additional Factors Impacting ED Encounter Risk None  Barth Kirks. [place] Small, [place] [email or web address]  Final Clinical Impressions(s) / ED Diagnoses     ICD-10-CM   1. Chest pain, unspecified type  R07.9       ED Discharge Orders     None        Discharge Instructions Discussed with and Provided to Patient:    Discharge Instructions      You were evaluated in the Emergency Department and after careful evaluation, we did not find any emergent condition requiring admission or further testing in the hospital.  Your exam/testing today is overall reassuring.  Recommend close follow-up with your primary care doctor to discuss your symptoms.  Please return to the Emergency Department if you experience any worsening of your condition.   Thank you for allowing [place] to be a part of your care.      Maudie Flakes, MD 03/03/22 [phone]

## 2022-03-06 DIAGNOSIS — R531 Weakness: Secondary | ICD-10-CM | POA: Diagnosis not present

## 2022-03-06 DIAGNOSIS — M2242 Chondromalacia patellae, left knee: Secondary | ICD-10-CM | POA: Diagnosis not present

## 2022-03-06 DIAGNOSIS — M2241 Chondromalacia patellae, right knee: Secondary | ICD-10-CM | POA: Diagnosis not present

## 2022-03-11 DIAGNOSIS — M2242 Chondromalacia patellae, left knee: Secondary | ICD-10-CM | POA: Diagnosis not present

## 2022-03-11 DIAGNOSIS — M2241 Chondromalacia patellae, right knee: Secondary | ICD-10-CM | POA: Diagnosis not present

## 2022-03-11 DIAGNOSIS — R531 Weakness: Secondary | ICD-10-CM | POA: Diagnosis not present

## 2022-03-19 DIAGNOSIS — R531 Weakness: Secondary | ICD-10-CM | POA: Diagnosis not present

## 2022-03-19 DIAGNOSIS — M2241 Chondromalacia patellae, right knee: Secondary | ICD-10-CM | POA: Diagnosis not present

## 2022-03-19 DIAGNOSIS — M2242 Chondromalacia patellae, left knee: Secondary | ICD-10-CM | POA: Diagnosis not present

## 2022-03-20 DIAGNOSIS — M2241 Chondromalacia patellae, right knee: Secondary | ICD-10-CM | POA: Diagnosis not present

## 2022-03-20 DIAGNOSIS — R531 Weakness: Secondary | ICD-10-CM | POA: Diagnosis not present

## 2022-03-20 DIAGNOSIS — M2242 Chondromalacia patellae, left knee: Secondary | ICD-10-CM | POA: Diagnosis not present

## 2022-03-25 ENCOUNTER — Ambulatory Visit: Payer: Medicare Other | Admitting: Internal Medicine

## 2022-03-25 DIAGNOSIS — R531 Weakness: Secondary | ICD-10-CM | POA: Diagnosis not present

## 2022-03-25 DIAGNOSIS — M2242 Chondromalacia patellae, left knee: Secondary | ICD-10-CM | POA: Diagnosis not present

## 2022-03-25 DIAGNOSIS — M2241 Chondromalacia patellae, right knee: Secondary | ICD-10-CM | POA: Diagnosis not present

## 2022-04-01 ENCOUNTER — Encounter: Payer: Medicare Other | Admitting: Nurse Practitioner

## 2022-04-01 DIAGNOSIS — M2242 Chondromalacia patellae, left knee: Secondary | ICD-10-CM | POA: Diagnosis not present

## 2022-04-01 DIAGNOSIS — M2241 Chondromalacia patellae, right knee: Secondary | ICD-10-CM | POA: Diagnosis not present

## 2022-04-01 DIAGNOSIS — R531 Weakness: Secondary | ICD-10-CM | POA: Diagnosis not present

## 2022-04-03 DIAGNOSIS — M2241 Chondromalacia patellae, right knee: Secondary | ICD-10-CM | POA: Diagnosis not present

## 2022-04-03 DIAGNOSIS — M2242 Chondromalacia patellae, left knee: Secondary | ICD-10-CM | POA: Diagnosis not present

## 2022-04-09 DIAGNOSIS — M65341 Trigger finger, right ring finger: Secondary | ICD-10-CM | POA: Diagnosis not present

## 2022-04-09 DIAGNOSIS — M25561 Pain in right knee: Secondary | ICD-10-CM | POA: Diagnosis not present

## 2022-04-10 ENCOUNTER — Encounter: Payer: Self-pay | Admitting: Nurse Practitioner

## 2022-04-10 ENCOUNTER — Ambulatory Visit (INDEPENDENT_AMBULATORY_CARE_PROVIDER_SITE_OTHER): Payer: Medicare Other | Admitting: Nurse Practitioner

## 2022-04-10 VITALS — BP 120/76 | HR 76 | Temp 97.7°F | Ht 63.5 in | Wt 172.0 lb

## 2022-04-10 DIAGNOSIS — Z79899 Other long term (current) drug therapy: Secondary | ICD-10-CM

## 2022-04-10 DIAGNOSIS — G8929 Other chronic pain: Secondary | ICD-10-CM

## 2022-04-10 DIAGNOSIS — R7309 Other abnormal glucose: Secondary | ICD-10-CM

## 2022-04-10 DIAGNOSIS — M810 Age-related osteoporosis without current pathological fracture: Secondary | ICD-10-CM

## 2022-04-10 DIAGNOSIS — E785 Hyperlipidemia, unspecified: Secondary | ICD-10-CM | POA: Diagnosis not present

## 2022-04-10 DIAGNOSIS — K219 Gastro-esophageal reflux disease without esophagitis: Secondary | ICD-10-CM

## 2022-04-10 DIAGNOSIS — G4700 Insomnia, unspecified: Secondary | ICD-10-CM

## 2022-04-10 DIAGNOSIS — I700 Atherosclerosis of aorta: Secondary | ICD-10-CM

## 2022-04-10 DIAGNOSIS — Z0001 Encounter for general adult medical examination with abnormal findings: Secondary | ICD-10-CM

## 2022-04-10 DIAGNOSIS — Z1389 Encounter for screening for other disorder: Secondary | ICD-10-CM | POA: Diagnosis not present

## 2022-04-10 DIAGNOSIS — Z0000 Encounter for general adult medical examination without abnormal findings: Secondary | ICD-10-CM

## 2022-04-10 DIAGNOSIS — E559 Vitamin D deficiency, unspecified: Secondary | ICD-10-CM | POA: Diagnosis not present

## 2022-04-10 DIAGNOSIS — I10 Essential (primary) hypertension: Secondary | ICD-10-CM

## 2022-04-10 DIAGNOSIS — F411 Generalized anxiety disorder: Secondary | ICD-10-CM

## 2022-04-10 DIAGNOSIS — E663 Overweight: Secondary | ICD-10-CM

## 2022-04-10 NOTE — Progress Notes (Signed)
CPE  Assessment and Plan:  Encounter for general adult medical exam Due annually  Atherosclerosis of aorta ([place])- per CT 05/2018 Control blood pressure, cholesterol, glucose, increase exercise.   Essential hypertension Discussed DASH (Dietary Approaches to Stop Hypertension) DASH diet is lower in sodium than a typical American diet. Cut back on foods that are high in saturated fat, cholesterol, and trans fats. Eat more whole-grain foods, fish, poultry, and nuts Remain active and exercise as tolerated daily.  Monitor BP at home-Call if greater than 130/80.  Check CMP/CBC   Gastroesophageal reflux disease, esophagitis presence not specified Start Pepcid PRN No suspected reflux complications ([place]/stricture). Lifestyle modification:  wt loss, avoid meals 2-3h before bedtime. Consider eliminating food triggers:  chocolate, caffeine, EtOH, acid/spicy food.   Vitamin D deficiency Continue supplement Monitor levels  Medication management All medications discussed and reviewed in full. All questions and concerns regarding medications addressed.    Age-related osteoporosis without current pathological fracture Gets through GYN Pursue a combination of weight-bearing exercises and strength training. Advised on fall prevention measures including proper lighting in all rooms, removal of area rugs and floor clutter, use of walking devices as deemed appropriate, avoidance of uneven walking surfaces. Consume 800 to 1000 IU of vitamin D daily with a goal vitamin D serum value of 30 ng/mL or higher. Aim for 1000 to 1200 mg of elemental calcium daily through supplements and/or dietary sources.   Hyperlipidemia, unspecified hyperlipidemia type Discussed lifestyle modifications. Recommended diet heavy in fruits and veggies, omega 3's. Decrease consumption of animal meats, cheeses, and dairy products. Remain active and exercise as tolerated. Continue to monitor. Check  lipids/TSH   Abnormal glucose Education: Reviewed 'ABCs' of diabetes management  Discussed goals to be met and/or maintained include A1C (<7) Blood pressure (<130/80) Cholesterol (LDL <70) Continue Eye Exam yearly  Continue Dental Exam Q6 mo Discussed dietary recommendations Discussed Physical Activity recommendations Foot exam UTD Check A1C   Insomnia, unspecified type Continue Lunestra with Tylenol PM at night. Discussed changing to Hydroxyzine. Okay to take Protonix at night if Tylenol PM upsets stomach Good sleep hygiene discussed Stay well hydrated.  Anxiety state Reviewed relaxation techniques.  Sleep hygiene. Recommended Cognitive Behavioral Therapy (CBT). Psychoeducation:  encouraged personality growth wand development through coping techniques and problem-solving skills. Limit/Decrease/Monitor drug/alcohol intake.     Overweight (BMI 25.0-29.9) Discussed appropriate BMI Goal of losing 1 lb per month. Diet modification. Physical activity. Encouraged/praised to build confidence.   Bil knee pain Ortho following   Orders Placed This Encounter  Procedures   CBC with Differential/Platelet   COMPLETE METABOLIC PANEL WITH GFR   Magnesium   Lipid panel   TSH   Insulin, random   VITAMIN D 25 Hydroxy (Vit-D Deficiency, Fractures)   Hemoglobin A1c   Urinalysis, Routine w reflex microscopic   Microalbumin / creatinine urine ratio    Notify office for further evaluation and treatment, questions or concerns if any reported s/s fail to improve.   The patient was advised to call back or seek an in-person evaluation if any symptoms worsen or if the condition fails to improve as anticipated.   Further disposition pending results of labs. Discussed med's effects and SE's.    I discussed the assessment and treatment plan with the patient. The patient was provided an opportunity to ask questions and all were answered. The patient agreed with the plan and demonstrated  an understanding of the instructions.  Discussed med's effects and SE's. Screening labs and tests as requested with  regular follow-up as recommended.  I provided 30 minutes of face-to-face time during this encounter including counseling, chart review, and critical decision making was preformed.   Future Appointments  Date Time Provider Mark  07/10/2022  3:30 PM Darrol Jump, NP [facility] None  04/13/2023  3:00 PM Darrol Jump, NP [facility] None     During the course of the visit the patient was educated and counseled about appropriate screening and preventive services including:   Pneumococcal vaccine  Prevnar 13 Influenza vaccine Td vaccine Screening electrocardiogram Bone densitometry screening Colorectal cancer screening Diabetes screening Glaucoma screening Nutrition counseling  Advanced directives: requested  HPI  71 y.o. female  presents for wellness visit and 3 month follow up. She has Hyperlipidemia; Abnormal glucose; Vitamin D deficiency; Insomnia; Anxiety state; Medication management; Hypertension; Osteoporosis; GERD (gastroesophageal reflux disease); Overweight (BMI 25.0-29.9); Aortic atherosclerosis ([place]) - per CT 05/2018; DNR (do not resuscitate); Bilateral impacted cerumen; and Chronic pain of both knees on their problem list.  She was seen in the ER on 8/20/203 for CP.After dinner patient experienced pain to the bilateral chest, malaise, nausea, single episode of vomiting.  Tried some of her antiacid medications but did not help.  Feeling a lot better at this time.  No shortness of breath, no leg pain or swelling, no abdominal pain.  EKG showed NSR  She reports R sided deep hip pain x 4-5 months, intermittent, but sometimes will have pain with walking, lifting leg. Describes as sharp pain, non-radiating. Has been applying salon pas without much benefit. Has tried tylenol and aleve but unsure if these really helped. She did have normal xray in 2021.    She does have bil knee pain suspect for arthritis, hasn't seen ortho, has been wearing knee compression, taking celebrex with good results. She has seen Dr. Norm Salt in the past for shoulder.   She has noticed increase in BLE neuropathy.  Has been on Gabapentin in the past.  Does not want to take.  Currently saw Ortho for trigger finger and would like to try Celebrex.  She has not started taking.   She take lunestra for sleep, up to 5 days a week along with Tylenol PM.    She has GERD, taking protonix 40 mg daily in AM, but may switch to night to help when taking the Tylenol PM.    BMI is Body mass index is 29.99 kg/m., she has been working on diet and exercise Wt Readings from Last 3 Encounters:  04/10/22 172 lb (78 kg)  11/28/21 174 lb (78.9 kg)  09/05/21 175 lb (79.4 kg)   Her blood pressure has been controlled at home, states up from rushing, today their BP is BP: 120/76.  She does workout and also fairly active job. She denies chest pain, shortness of breath, dizziness.    She has aortic atherosclerosis per CT 05/2018.   She is on cholesterol medication, JUST zetia. Mostly eats poultry. She has taken rosuvastatin 40 mg in the past but stopped due to myalgias, unsure if tried low dose low frequency or other statins. Her cholesterol is at goal. The cholesterol last visit was:   Lab Results  Component Value Date   CHOL 175 05/14/2021   HDL 52 05/14/2021   LDLCALC 99 05/14/2021   TRIG 142 05/14/2021   CHOLHDL 3.4 05/14/2021  . She has been working on diet and exercise for prediabetes, she is on bASA, she is on ACE/ARB and denies foot ulcerations, hyperglycemia, hypoglycemia , increased appetite, nausea,  paresthesia of the feet, polydipsia, polyuria, visual disturbances, vomiting and weight loss. Last A1C in the office was:  Lab Results  Component Value Date   HGBA1C 5.7 (H) 05/14/2021  .  Patient is on Vitamin D supplement.   Lab Results  Component Value Date   VD25OH 55  11/07/2020        Current Medications:  Current Outpatient Medications on File Prior to Visit  Medication Sig Dispense Refill   acyclovir (ZOVIRAX) 200 MG capsule Take 1 capsule 2 x /day as needed for Mouth Ulcers 180 capsule 3   celecoxib (CELEBREX) 100 MG capsule Take  1 capsule Twice Daily as needed with Food for Pain & Inflammation 180 capsule 3   Cholecalciferol (VITAMIN D PO) Take 5,000 Units by mouth daily.     eszopiclone (LUNESTA) 2 MG TABS tablet TAKE ONE TABLET BY MOUTH EVERY NIGHT AT BEDTIME AS NEEDED FOR SLEEP *LIMIT TO 5 DAYS A WEEK TO AVOID ADDICTION AND DEMENTIA* SHOULD LAST 6 WEEKS 30 tablet 0   ezetimibe (ZETIA) 10 MG tablet Take  1 tablet  Daily  for Cholesterol  / Patient knows to take by mouth 90 tablet 3   gabapentin (NEURONTIN) 100 MG capsule 1 tablet     Glycerin-Polysorbate 80 (REFRESH DRY EYE THERAPY OP) Apply 1 drop to eye daily.     itraconazole (SPORANOX) 100 MG capsule Take 1 capsule (100 mg total) by mouth daily. 90 capsule 0   pantoprazole (PROTONIX) 40 MG tablet Take  1 tablet  Daily  to Prevent Heartburn and Indigestion 90 tablet 3   sucralfate (CARAFATE) 1 g tablet DISSOLVE 1 TABLET IN 2-3 OZ WATER AND DRINK 30 MINUTES PRIOR TO EACH MEAL AND DINNER(4 TIMES DAILY) 120 tablet 1   vitamin C (ASCORBIC ACID) 500 MG tablet Take 500 mg by mouth 2 (two) times daily.     vitamin E 100 UNIT capsule Take 100 Units by mouth every morning.     Zinc 50 MG TABS Take 1 tablet Daily  0   alendronate (FOSAMAX) 70 MG tablet 1 tablet (Patient not taking: Reported on 04/10/2022)     omeprazole (PRILOSEC) 20 MG capsule 1 capsule (Patient not taking: Reported on 04/10/2022)     No current facility-administered medications on file prior to visit.    Health Maintenance:   Immunization History  Administered Date(s) Administered   PFIZER(Purple Top)SARS-COV-2 Vaccination 09/21/2019, 10/19/2019, 06/29/2020   Tdap 08/16/2012   Tetanus: 2014 Pneumovax: Declined Zostavax:  Declined Influenza Due 04/2022 Covid 19: 2/2, 2021, pfizer, booster 06/2020  MGM: 01/2021 DEXA: 2013, gets at OBGYN, reports has planned for 2023 cologuard 03/04/2016 + Colonoscopy 06/12/2017, normal, Dr. Watt Climes, 10 years Sleep study 04/2014  Last Eye Exam:  Dr. Sabra Heck, last 2023, glasses Last dental exam: [facility], last 2023, goes q30m Patient Care Team: MUnk Pinto MD as PCP - General (Internal Medicine) MBarbaraann Cao OAshburnas Referring Physician (Optometry) MClarene Essex MD as Consulting Physician (Gastroenterology) CChristophe Louis MD as Consulting Physician (Obstetrics and Gynecology) DMelrose Nakayama MD as Consulting Physician (Orthopedic Surgery) GDorna Leitz MD as Consulting Physician (Orthopedic Surgery)  Medical History:  Past Medical History:  Diagnosis Date   Arthritis    Hyperlipidemia    Labile hypertension    Allergies No Known Allergies  SURGICAL HISTORY She  has a past surgical history that includes Neck surgery and Reduction mammaplasty (Bilateral, 2007). FAMILY HISTORY Her family history includes Diabetes in her mother and another family member; Heart attack in her  father and mother; Multiple sclerosis in an other family member. SOCIAL HISTORY She  reports that she has never smoked. She has never used smokeless tobacco. She reports that she does not drink alcohol and does not use drugs.   Review of Systems: Review of Systems  Constitutional:  Negative for chills, fever and malaise/fatigue.  HENT:  Negative for congestion, ear pain and sore throat.   Eyes: Negative.   Respiratory:  Negative for cough, shortness of breath and wheezing.   Cardiovascular:  Negative for chest pain, palpitations and leg swelling.  Gastrointestinal:  Positive for heartburn. Negative for abdominal pain, blood in stool, constipation, diarrhea, melena, nausea and vomiting.  Genitourinary: Negative.   Musculoskeletal:  Positive for joint pain (bil knees, L worse).  Negative for back pain.  Skin: Negative.   Neurological:  Negative for dizziness, sensory change, loss of consciousness and headaches.  Psychiatric/Behavioral:  Negative for depression. The patient is not nervous/anxious and does not have insomnia.     Physical Exam: Estimated body mass index is 29.99 kg/m as calculated from the following:   Height as of this encounter: 5' 3.5" (1.613 m).   Weight as of this encounter: 172 lb (78 kg). BP 120/76   Pulse 76   Temp 97.7 F (36.5 C)   Ht 5' 3.5" (1.613 m)   Wt 172 lb (78 kg)   SpO2 99%   BMI 29.99 kg/m   General Appearance: Well nourished well developed, in no apparent distress.  Eyes: PERRLA, EOMs, conjunctiva no swelling or erythema ENT/Mouth: Ear canals bil obstructed by soft wax.  Oropharynx moist and clear with no exudate, erythema, or swelling.   Neck: Supple, thyroid normal. No bruits.  No cervical adenopathy Respiratory: Respiratory effort normal, Breath sounds clear A&P without wheeze, rhonchi, rales.   Cardio: RRR without murmurs, rubs or gallops. Brisk peripheral pulses without edema.  Chest: non-tender, symmetric, with normal excursions Abdomen: Soft, non tender no guarding,  hernias, masses, or organomegaly.  Lymphatics: Non tender without lymphadenopathy.  Musculoskeletal: Full ROM all peripheral extremities, 5/5 strength, and normal gait. No effusions or obvious deformity.  Skin: Warm, dry without rashes, lesions, ecchymosis. Neuro: Awake and oriented X 3, Cranial nerves intact, reflexes equal bilaterally. Normal muscle tone, no cerebellar symptoms. Sensation intact.  Psych:  normal affect, Insight and Judgment appropriate.   EKG:  Completed 82023 in ED NSR  Susan Carr 10:00 PM [facility]

## 2022-04-11 ENCOUNTER — Other Ambulatory Visit: Payer: Self-pay | Admitting: Nurse Practitioner

## 2022-04-11 LAB — HEMOGLOBIN A1C
Hgb A1c MFr Bld: 5.7 % of total Hgb — ABNORMAL HIGH (ref <5.7)
Mean Plasma Glucose: 117 mg/dL
eAG (mmol/L): 6.5 mmol/L

## 2022-04-11 LAB — CBC WITH DIFFERENTIAL/PLATELET
Absolute Monocytes: 343 cells/uL (ref 200–950)
Basophils Absolute: 31 cells/uL (ref 0–200)
Basophils Relative: 0.6 %
Eosinophils Absolute: 192 cells/uL (ref 15–500)
Eosinophils Relative: 3.7 %
HCT: 40.8 % (ref 35.0–45.0)
Hemoglobin: 13.7 g/dL (ref 11.7–15.5)
Lymphs Abs: 2116 cells/uL (ref 850–3900)
MCH: 32.3 pg (ref 27.0–33.0)
MCHC: 33.6 g/dL (ref 32.0–36.0)
MCV: 96.2 fL (ref 80.0–100.0)
MPV: 9.6 fL (ref 7.5–12.5)
Monocytes Relative: 6.6 %
Neutro Abs: 2517 cells/uL (ref 1500–7800)
Neutrophils Relative %: 48.4 %
Platelets: 327 10*3/uL (ref 140–400)
RBC: 4.24 10*6/uL (ref 3.80–5.10)
RDW: 12.4 % (ref 11.0–15.0)
Total Lymphocyte: 40.7 %
WBC: 5.2 10*3/uL (ref 3.8–10.8)

## 2022-04-11 LAB — COMPLETE METABOLIC PANEL WITH GFR
AG Ratio: 1.8 (calc) (ref 1.0–2.5)
ALT: 16 U/L (ref 6–29)
AST: 22 U/L (ref 10–35)
Albumin: 4.4 g/dL (ref 3.6–5.1)
Alkaline phosphatase (APISO): 61 U/L (ref 37–153)
BUN: 17 mg/dL (ref 7–25)
CO2: 29 mmol/L (ref 20–32)
Calcium: 9.5 mg/dL (ref 8.6–10.4)
Chloride: 105 mmol/L (ref 98–110)
Creat: 0.82 mg/dL (ref 0.60–1.00)
Globulin: 2.4 g/dL (calc) (ref 1.9–3.7)
Glucose, Bld: 87 mg/dL (ref 65–99)
Potassium: 4.2 mmol/L (ref 3.5–5.3)
Sodium: 140 mmol/L (ref 135–146)
Total Bilirubin: 0.6 mg/dL (ref 0.2–1.2)
Total Protein: 6.8 g/dL (ref 6.1–8.1)
eGFR: 76 mL/min/{1.73_m2} (ref >=60)

## 2022-04-11 LAB — URINALYSIS, ROUTINE W REFLEX MICROSCOPIC
Bilirubin Urine: NEGATIVE
Glucose, UA: NEGATIVE
Hgb urine dipstick: NEGATIVE
Leukocytes,Ua: NEGATIVE
Nitrite: NEGATIVE
Protein, ur: NEGATIVE
Specific Gravity, Urine: 1.019 (ref 1.001–1.035)
pH: 6.5 (ref 5.0–8.0)

## 2022-04-11 LAB — LIPID PANEL
Cholesterol: 219 mg/dL — ABNORMAL HIGH (ref <200)
HDL: 61 mg/dL (ref >=50)
LDL Cholesterol (Calc): 142 mg/dL (calc) — ABNORMAL HIGH
Non-HDL Cholesterol (Calc): 158 mg/dL (calc) — ABNORMAL HIGH (ref <130)
Total CHOL/HDL Ratio: 3.6 (calc) (ref <5.0)
Triglycerides: 72 mg/dL (ref <150)

## 2022-04-11 LAB — MICROALBUMIN / CREATININE URINE RATIO
Creatinine, Urine: 86 mg/dL (ref 20–275)
Microalb Creat Ratio: 2 mcg/mg creat (ref <30)
Microalb, Ur: 0.2 mg/dL

## 2022-04-11 LAB — MAGNESIUM: Magnesium: 2.2 mg/dL (ref 1.5–2.5)

## 2022-04-11 LAB — INSULIN, RANDOM: Insulin: 7.3 u[IU]/mL

## 2022-04-11 LAB — TSH: TSH: 0.32 mIU/L — ABNORMAL LOW (ref 0.40–4.50)

## 2022-04-11 LAB — VITAMIN D 25 HYDROXY (VIT D DEFICIENCY, FRACTURES): Vit D, 25-Hydroxy: 73 ng/mL (ref 30–100)

## 2022-04-11 MED ORDER — HYDROXYZINE HCL 50 MG PO TABS: ORAL_TABLET | ORAL | 2 refills | Status: DC

## 2022-04-28 ENCOUNTER — Other Ambulatory Visit: Payer: Self-pay | Admitting: Internal Medicine

## 2022-04-28 MED ORDER — EZETIMIBE 10 MG PO TABS: ORAL_TABLET | ORAL | 3 refills | Status: DC

## 2022-04-29 ENCOUNTER — Other Ambulatory Visit: Payer: Self-pay | Admitting: Internal Medicine

## 2022-04-29 ENCOUNTER — Other Ambulatory Visit: Payer: Self-pay

## 2022-04-29 DIAGNOSIS — K219 Gastro-esophageal reflux disease without esophagitis: Secondary | ICD-10-CM

## 2022-04-29 MED ORDER — PANTOPRAZOLE SODIUM 40 MG PO TBEC: DELAYED_RELEASE_TABLET | ORAL | 3 refills | Status: DC

## 2022-05-05 ENCOUNTER — Telehealth: Payer: Self-pay

## 2022-05-05 NOTE — Telephone Encounter (Signed)
Refill request for Eszopiclone.

## 2022-05-06 ENCOUNTER — Other Ambulatory Visit: Payer: Self-pay | Admitting: Nurse Practitioner

## 2022-05-06 DIAGNOSIS — G4700 Insomnia, unspecified: Secondary | ICD-10-CM

## 2022-05-06 MED ORDER — ESZOPICLONE 2 MG PO TABS: ORAL_TABLET | ORAL | 0 refills | Status: DC

## 2022-05-14 ENCOUNTER — Ambulatory Visit: Payer: Medicare Other | Admitting: Nurse Practitioner

## 2022-05-15 DIAGNOSIS — L821 Other seborrheic keratosis: Secondary | ICD-10-CM | POA: Diagnosis not present

## 2022-05-15 DIAGNOSIS — D2262 Melanocytic nevi of left upper limb, including shoulder: Secondary | ICD-10-CM | POA: Diagnosis not present

## 2022-05-15 DIAGNOSIS — L918 Other hypertrophic disorders of the skin: Secondary | ICD-10-CM | POA: Diagnosis not present

## 2022-06-19 ENCOUNTER — Ambulatory Visit (INDEPENDENT_AMBULATORY_CARE_PROVIDER_SITE_OTHER): Payer: Medicare Other | Admitting: Podiatry

## 2022-06-19 DIAGNOSIS — M7751 Other enthesopathy of right foot: Secondary | ICD-10-CM

## 2022-06-19 DIAGNOSIS — L600 Ingrowing nail: Secondary | ICD-10-CM

## 2022-06-19 DIAGNOSIS — M7752 Other enthesopathy of left foot: Secondary | ICD-10-CM | POA: Diagnosis not present

## 2022-06-19 MED ORDER — TRIAMCINOLONE ACETONIDE 10 MG/ML IJ SUSP
20.0000 mg | Freq: Once | INTRAMUSCULAR | Status: AC
  Administered 2022-06-19: 20 mg

## 2022-06-25 ENCOUNTER — Encounter: Payer: Self-pay | Admitting: Internal Medicine

## 2022-07-09 ENCOUNTER — Ambulatory Visit: Payer: Medicare Other | Admitting: Podiatry

## 2022-07-10 ENCOUNTER — Encounter: Payer: Self-pay | Admitting: Nurse Practitioner

## 2022-07-10 ENCOUNTER — Ambulatory Visit (INDEPENDENT_AMBULATORY_CARE_PROVIDER_SITE_OTHER): Payer: Medicare Other | Admitting: Nurse Practitioner

## 2022-07-10 VITALS — BP 126/76 | HR 73 | Temp 97.2°F | Ht 63.5 in | Wt 175.0 lb

## 2022-07-10 DIAGNOSIS — E663 Overweight: Secondary | ICD-10-CM | POA: Diagnosis not present

## 2022-07-10 DIAGNOSIS — Z0001 Encounter for general adult medical examination with abnormal findings: Secondary | ICD-10-CM

## 2022-07-10 DIAGNOSIS — M25561 Pain in right knee: Secondary | ICD-10-CM | POA: Diagnosis not present

## 2022-07-10 DIAGNOSIS — Z0000 Encounter for general adult medical examination without abnormal findings: Secondary | ICD-10-CM

## 2022-07-10 DIAGNOSIS — K219 Gastro-esophageal reflux disease without esophagitis: Secondary | ICD-10-CM | POA: Diagnosis not present

## 2022-07-10 DIAGNOSIS — R6889 Other general symptoms and signs: Secondary | ICD-10-CM

## 2022-07-10 DIAGNOSIS — G4700 Insomnia, unspecified: Secondary | ICD-10-CM | POA: Diagnosis not present

## 2022-07-10 DIAGNOSIS — I10 Essential (primary) hypertension: Secondary | ICD-10-CM

## 2022-07-10 DIAGNOSIS — Z79899 Other long term (current) drug therapy: Secondary | ICD-10-CM

## 2022-07-10 DIAGNOSIS — M810 Age-related osteoporosis without current pathological fracture: Secondary | ICD-10-CM | POA: Diagnosis not present

## 2022-07-10 DIAGNOSIS — R7309 Other abnormal glucose: Secondary | ICD-10-CM | POA: Diagnosis not present

## 2022-07-10 DIAGNOSIS — M25531 Pain in right wrist: Secondary | ICD-10-CM

## 2022-07-10 DIAGNOSIS — M25562 Pain in left knee: Secondary | ICD-10-CM

## 2022-07-10 DIAGNOSIS — E785 Hyperlipidemia, unspecified: Secondary | ICD-10-CM

## 2022-07-10 DIAGNOSIS — E559 Vitamin D deficiency, unspecified: Secondary | ICD-10-CM

## 2022-07-10 DIAGNOSIS — F411 Generalized anxiety disorder: Secondary | ICD-10-CM | POA: Diagnosis not present

## 2022-07-10 DIAGNOSIS — G8929 Other chronic pain: Secondary | ICD-10-CM

## 2022-07-10 DIAGNOSIS — I700 Atherosclerosis of aorta: Secondary | ICD-10-CM

## 2022-07-10 MED ORDER — CELECOXIB 100 MG PO CAPS: ORAL_CAPSULE | ORAL | 3 refills | Status: DC

## 2022-07-10 MED ORDER — ESZOPICLONE 2 MG PO TABS: ORAL_TABLET | ORAL | 0 refills | Status: DC

## 2022-07-10 NOTE — Progress Notes (Signed)
AWV and FU  Assessment and Plan:  Annual Medicare Wellness Visit Due annually  Health maintenance reviewed DNR on file  Atherosclerosis of aorta ([place])- per CT 05/2018 Control blood pressure, cholesterol, glucose, increase exercise.   Essential hypertension Discussed DASH (Dietary Approaches to Stop Hypertension) DASH diet is lower in sodium than a typical American diet. Cut back on foods that are high in saturated fat, cholesterol, and trans fats. Eat more whole-grain foods, fish, poultry, and nuts Remain active and exercise as tolerated daily.  Monitor BP at home-Call if greater than 130/80.  Check CMP/CBC   Gastroesophageal reflux disease, esophagitis presence not specified No suspected reflux complications ([place]/stricture). Lifestyle modification:  wt loss, avoid meals 2-3h before bedtime. Consider eliminating food triggers:  chocolate, caffeine, EtOH, acid/spicy food.   Vitamin D deficiency Continue supplement Monitor levels  Medication management All medications discussed and reviewed in full. All questions and concerns regarding medications addressed.    Age-related osteoporosis without current pathological fracture Pursue a combination of weight-bearing exercises and strength training. Advised on fall prevention measures including proper lighting in all rooms, removal of area rugs and floor clutter, use of walking devices as deemed appropriate, avoidance of uneven walking surfaces. Smoking cessation and moderate alcohol consumption if applicable Consume 712 to 1000 IU of vitamin D daily with a goal vitamin D serum value of 30 ng/mL or higher. Aim for 1000 to 1200 mg of elemental calcium daily through supplements and/or dietary sources.   Hyperlipidemia, unspecified hyperlipidemia type Discussed lifestyle modifications. Recommended diet heavy in fruits and veggies, omega 3's. Decrease consumption of animal meats, cheeses, and dairy products. Remain active and  exercise as tolerated. Continue to monitor. Check lipids/TSH   Abnormal glucose Education: Reviewed 'ABCs' of diabetes management  Discussed goals to be met and/or maintained include A1C (<7) Blood pressure (<130/80) Cholesterol (LDL <70) Continue Eye Exam yearly  Continue Dental Exam Q6 mo Discussed dietary recommendations Discussed Physical Activity recommendations Check A1C  Insomnia, unspecified type Gabriel Earing works well Discussed good sleep hygiene. Establish bed and wake times. Sleep restriction-only sleep estimated hrs sleep. Bed only for sex and sleep, only sleep when sleepy, out of bed if anxious (stimulus control). Reviewed relaxation techniques, mindful meditations. Expected sleep duration. Addressed worries about not sleeping.    Anxiety state Reviewed relaxation techniques.  Sleep hygiene. Recommended Cognitive Behavioral Therapy (CBT). Recommended mindfulness meditation and exercise.   Encouraged personality growth wand development through coping techniques and problem-solving skills. Limit/Decrease/Monitor drug/alcohol intake.     Overweight (BMI 25.0-29.9) Discussed appropriate BMI Diet modification. Physical activity. Encouraged/praised to build confidence.   Bil knee pain Declines imaging, est with ortho, declines follow up at this time Monitor  Orders Placed This Encounter  Procedures   CBC with Differential/Platelet   COMPLETE METABOLIC PANEL WITH GFR   Lipid panel   Hemoglobin A1c   Meds ordered this encounter  Medications   celecoxib (CELEBREX) 100 MG capsule    Sig: Take  1 capsule Twice Daily as needed with Food for Pain & Inflammation    Dispense:  180 capsule    Refill:  3   eszopiclone (LUNESTA) 2 MG TABS tablet    Sig: TAKE ONE TABLET BY MOUTH EVERY NIGHT AT BEDTIME AS NEEDED FOR SLEEP *LIMIT TO 5 DAYS A WEEK TO AVOID ADDICTION AND DEMENTIA* SHOULD LAST 6 WEEKS    Dispense:  30 tablet    Refill:  0    Order Specific Question:    Supervising Provider  Answer:   Unk Pinto [[id number]]    Notify office for further evaluation and treatment, questions or concerns if any reported s/s fail to improve.   The patient was advised to call back or seek an in-person evaluation if any symptoms worsen or if the condition fails to improve as anticipated.   Further disposition pending results of labs. Discussed med's effects and SE's.    I discussed the assessment and treatment plan with the patient. The patient was provided an opportunity to ask questions and all were answered. The patient agreed with the plan and demonstrated an understanding of the instructions.  Discussed med's effects and SE's. Screening labs and tests as requested with regular follow-up as recommended.  I provided 35 minutes of face-to-face time during this encounter including counseling, chart review, and critical decision making was preformed.   Future Appointments  Date Time Provider [place]  04/13/2023  3:00 PM Darrol Jump, NP [facility] None  07/13/2023  4:00 PM Darrol Jump, NP [facility] None     During the course of the visit the patient was educated and counseled about appropriate screening and preventive services including:   Pneumococcal vaccine  Prevnar 13 Influenza vaccine Td vaccine Screening electrocardiogram Bone densitometry screening Colorectal cancer screening Diabetes screening Glaucoma screening Nutrition counseling  Advanced directives: requested  HPI  71 y.o. female  presents for wellness visit and 3 month follow up. She has Hyperlipidemia; Abnormal glucose; Vitamin D deficiency; Insomnia; Anxiety state; Medication management; Hypertension; Osteoporosis; GERD (gastroesophageal reflux disease); Overweight (BMI 25.0-29.9); Aortic atherosclerosis ([place]) - per CT 05/2018; DNR (do not resuscitate); Bilateral impacted cerumen; and Chronic pain of both knees on their problem list.  Overall she reports feeling  well today.  She has no new or additional concerns.    She does have bil knee pain suspect for arthritis, hasn't seen ortho, has been wearing knee compression, taking celebrex with good results. She has seen Dr. Norm Salt in the past for shoulder. 04/09/22:  Saw Remy for follow up.  She did not feel as though tht PT helped her knees very much and made things worse.  She has been off Celebrex for several months.  She also continues with trigger finger in her right finger.  She has been injected on 4-5 occassions.  Celebrex was refilled.  Discussed injection to trigger finger around 06/2022.  She take lunestra for sleep, also reports taking ashwaganda and melatonin which she feels is helpful. She does not take the [place] nightly, only when needed.    She has GERD, taking protonix 40 mg daily in AM, reports having some sx in the evening, will intermittently have sense of food "dragging" in chest.   BMI is Body mass index is 30.51 kg/m., she has been working on diet and exercise Wt Readings from Last 3 Encounters:  07/10/22 175 lb (79.4 kg)  04/10/22 172 lb (78 kg)  11/28/21 174 lb (78.9 kg)   Her blood pressure has been controlled at home, states up from rushing, today their BP is BP: 126/76.  She does workout and also fairly active job. She denies chest pain, shortness of breath, dizziness.    She has aortic atherosclerosis per CT 05/2018.   She is on cholesterol medication, JUST zetia. Mostly eats poultry. She has taken rosuvastatin 40 mg in the past but stopped due to myalgias, unsure if tried low dose low frequency or other statins. Her cholesterol is at goal. The cholesterol last visit was:   Lab Results  Component Value Date   CHOL 219 (H) 04/10/2022   HDL 61 04/10/2022   LDLCALC 142 (H) 04/10/2022   TRIG 72 04/10/2022   CHOLHDL 3.6 04/10/2022  . She has been working on diet and exercise for prediabetes, she is on bASA, she is on ACE/ARB and denies foot ulcerations,  hyperglycemia, hypoglycemia , increased appetite, nausea, paresthesia of the feet, polydipsia, polyuria, visual disturbances, vomiting and weight loss. Last A1C in the office was:  Lab Results  Component Value Date   HGBA1C 5.7 (H) 04/10/2022  .  Patient is on Vitamin D supplement.   Lab Results  Component Value Date   VD25OH 73 04/10/2022        Current Medications:  Current Outpatient Medications on File Prior to Visit  Medication Sig Dispense Refill   acyclovir (ZOVIRAX) 200 MG capsule Take 1 capsule 2 x /day as needed for Mouth Ulcers 180 capsule 3   Cholecalciferol (VITAMIN D PO) Take 5,000 Units by mouth daily.     ezetimibe (ZETIA) 10 MG tablet Take  1 tablet  Daily  for Cholesterol 90 tablet 3   gabapentin (NEURONTIN) 100 MG capsule 1 tablet     Glycerin-Polysorbate 80 (REFRESH DRY EYE THERAPY OP) Apply 1 drop to eye daily.     hydrOXYzine (ATARAX) 50 MG tablet Take 1/2 to 1 tab at night for insomnia. 60 tablet 2   pantoprazole (PROTONIX) 40 MG tablet Take  1 tablet  Daily  to Prevent Heartburn and Indigestion 90 tablet 3   sucralfate (CARAFATE) 1 g tablet DISSOLVE 1 TABLET IN 2-3 OZ WATER AND DRINK 30 MINUTES PRIOR TO EACH MEAL AND DINNER(4 TIMES DAILY) 120 tablet 1   vitamin C (ASCORBIC ACID) 500 MG tablet Take 500 mg by mouth 2 (two) times daily.     vitamin E 100 UNIT capsule Take 100 Units by mouth every morning.     Zinc 50 MG TABS Take 1 tablet Daily  0   itraconazole (SPORANOX) 100 MG capsule Take 1 capsule (100 mg total) by mouth daily. (Patient not taking: Reported on 07/10/2022) 90 capsule 0   No current facility-administered medications on file prior to visit.    Health Maintenance:   Immunization History  Administered Date(s) Administered   PFIZER(Purple Top)SARS-COV-2 Vaccination 09/21/2019, 10/19/2019, 06/29/2020   Tdap 08/16/2012   Tetanus: 2014 Pneumovax: Declined Zostavax: Declined Influenza declines Covid 19: 2/2, 2021, pfizer, booster  06/2020  MGM: 01/2021 Due gets through OBGYN has planned for 2024. DEXA: 2013, gets at [facility], reports has planned for 2024 cologuard 03/04/2016 + Colonoscopy 06/12/2017, normal, Dr. Watt Climes, 10 years Sleep study 04/2014  Last Eye Exam:  Dr. Sabra Heck, last 2023, glasses Last dental exam: Dr. Theora Gianotti, last 2023, goes q11m Patient Care Team: MUnk Pinto MD as PCP - General (Internal Medicine) MBarbaraann Cao O[place]umbaas Referring Physician (Optometry) MClarene Essex MD as Consulting Physician (Gastroenterology) CChristophe Louis MD as Consulting Physician (Obstetrics and Gynecology) DMelrose Nakayama MD as Consulting Physician (Orthopedic Surgery) GDorna Leitz MD as Consulting Physician (Orthopedic Surgery)  Medical History:  Past Medical History:  Diagnosis Date   Arthritis    Hyperlipidemia    Labile hypertension    Allergies No Known Allergies  SURGICAL HISTORY She  has a past surgical history that includes Neck surgery and Reduction mammaplasty (Bilateral, 2007). FAMILY HISTORY Her family history includes Diabetes in her mother and another family member; Heart attack in her father and mother; Multiple sclerosis in an other family member. SOCIAL HISTORY  She  reports that she has never smoked. She has never used smokeless tobacco. She reports that she does not drink alcohol and does not use drugs.  MEDICARE WELLNESS OBJECTIVES: Physical activity:   Cardiac risk factors:   Depression/mood screen:      07/10/2022    4:14 PM  Depression screen PHQ 2/9  Decreased Interest 0  Down, Depressed, Hopeless 0  PHQ - 2 Score 0    ADLs:     07/10/2022    4:07 PM  In your present state of health, do you have any difficulty performing the following activities:  Hearing? 0  Vision? 0  Difficulty concentrating or making decisions? 0  Walking or climbing stairs? 0  Dressing or bathing? 0  Doing errands, shopping? 0  Preparing Food and eating ? N  Using the Toilet? N  In the past  six months, have you accidently leaked urine? N  Do you have problems with loss of bowel control? N  Managing your Medications? N  Managing your Finances? N  Housekeeping or managing your Housekeeping? N      Cognitive Testing  Alert? Yes  Normal Appearance?Yes  Oriented to person? Yes  Place? Yes   Time? Yes  Recall of three objects?  Yes  Can perform simple calculations? Yes  Displays appropriate judgment?Yes  Can read the correct time from a watch face?Yes  EOL planning: Does Patient Have a Medical Advance Directive?: No   Review of Systems: Review of Systems  Constitutional:  Negative for chills, fever and malaise/fatigue.  HENT:  Negative for congestion, ear pain and sore throat.   Eyes: Negative.   Respiratory:  Negative for cough, shortness of breath and wheezing.   Cardiovascular:  Negative for chest pain, palpitations and leg swelling.  Gastrointestinal:  Positive for heartburn. Negative for abdominal pain, blood in stool, constipation, diarrhea, melena, nausea and vomiting.  Genitourinary: Negative.   Musculoskeletal:  Positive for joint pain (bil knees, L worse). Negative for back pain.  Skin: Negative.   Neurological:  Negative for dizziness, sensory change, loss of consciousness and headaches.  Psychiatric/Behavioral:  Negative for depression. The patient is not nervous/anxious and does not have insomnia.     Physical Exam: Estimated body mass index is 30.51 kg/m as calculated from the following:   Height as of this encounter: 5' 3.5" (1.613 m).   Weight as of this encounter: 175 lb (79.4 kg). BP 126/76   Pulse 73   Temp (!) 97.2 F (36.2 C)   Ht 5' 3.5" (1.613 m)   Wt 175 lb (79.4 kg)   SpO2 97%   BMI 30.51 kg/m   General Appearance: Well nourished well developed, in no apparent distress.  Eyes: PERRLA, EOMs, conjunctiva no swelling or erythema ENT/Mouth: Ear canals bil obstructed by soft wax.  Oropharynx moist and clear with no exudate, erythema, or  swelling.   Neck: Supple, thyroid normal. No bruits.  No cervical adenopathy Respiratory: Respiratory effort normal, Breath sounds clear A&P without wheeze, rhonchi, rales.   Cardio: RRR without murmurs, rubs or gallops. Brisk peripheral pulses without edema.  Chest: non-tender, symmetric, with normal excursions Abdomen: Soft, non tender no guarding,  hernias, masses, or organomegaly.  Lymphatics: Non tender without lymphadenopathy.  Musculoskeletal: Full ROM all peripheral extremities, 5/5 strength, and normal gait. No effusions or obvious deformity.  Skin: Warm, dry without rashes, lesions, ecchymosis. Neuro: Awake and oriented X 3, Cranial nerves intact, reflexes equal bilaterally. Normal muscle tone, no cerebellar symptoms. Sensation intact.  Psych:  normal affect, Insight and Judgment appropriate.    Medicare Attestation I have personally reviewed: The patient's medical and social history Their use of alcohol, tobacco or illicit drugs Their current medications and supplements The patient's functional ability including ADLs,fall risks, home safety risks, cognitive, and hearing and visual impairment Diet and physical activities Evidence for depression or mood disorders  The patient's weight, height, BMI, and visual acuity have been recorded in the chart.  I have made referrals, counseling, and provided education to the patient based on review of the above and I have provided the patient with a written personalized care plan for preventive services.    Zayaan Kozak 4:25 PM [facility]

## 2022-07-10 NOTE — Patient Instructions (Signed)

## 2022-07-11 LAB — LIPID PANEL
Cholesterol: 244 mg/dL — ABNORMAL HIGH (ref <200)
HDL: 60 mg/dL (ref >=50)
LDL Cholesterol (Calc): 161 mg/dL (calc) — ABNORMAL HIGH
Non-HDL Cholesterol (Calc): 184 mg/dL (calc) — ABNORMAL HIGH (ref <130)
Total CHOL/HDL Ratio: 4.1 (calc) (ref <5.0)
Triglycerides: 116 mg/dL (ref <150)

## 2022-07-11 LAB — COMPLETE METABOLIC PANEL WITH GFR
AG Ratio: 1.4 (calc) (ref 1.0–2.5)
ALT: 16 U/L (ref 6–29)
AST: 18 U/L (ref 10–35)
Albumin: 4.1 g/dL (ref 3.6–5.1)
Alkaline phosphatase (APISO): 54 U/L (ref 37–153)
BUN: 17 mg/dL (ref 7–25)
CO2: 27 mmol/L (ref 20–32)
Calcium: 9.6 mg/dL (ref 8.6–10.4)
Chloride: 105 mmol/L (ref 98–110)
Creat: 0.97 mg/dL (ref 0.60–1.00)
Globulin: 2.9 g/dL (calc) (ref 1.9–3.7)
Glucose, Bld: 86 mg/dL (ref 65–99)
Potassium: 4.7 mmol/L (ref 3.5–5.3)
Sodium: 142 mmol/L (ref 135–146)
Total Bilirubin: 0.8 mg/dL (ref 0.2–1.2)
Total Protein: 7 g/dL (ref 6.1–8.1)
eGFR: 62 mL/min/{1.73_m2} (ref >=60)

## 2022-07-11 LAB — CBC WITH DIFFERENTIAL/PLATELET
Absolute Monocytes: 340 cells/uL (ref 200–950)
Basophils Absolute: 40 cells/uL (ref 0–200)
Basophils Relative: 0.8 %
Eosinophils Absolute: 240 cells/uL (ref 15–500)
Eosinophils Relative: 4.8 %
HCT: 41.4 % (ref 35.0–45.0)
Hemoglobin: 14.3 g/dL (ref 11.7–15.5)
Lymphs Abs: 1690 cells/uL (ref 850–3900)
MCH: 32.8 pg (ref 27.0–33.0)
MCHC: 34.5 g/dL (ref 32.0–36.0)
MCV: 95 fL (ref 80.0–100.0)
MPV: 10 fL (ref 7.5–12.5)
Monocytes Relative: 6.8 %
Neutro Abs: 2690 cells/uL (ref 1500–7800)
Neutrophils Relative %: 53.8 %
Platelets: 271 10*3/uL (ref 140–400)
RBC: 4.36 10*6/uL (ref 3.80–5.10)
RDW: 11.7 % (ref 11.0–15.0)
Total Lymphocyte: 33.8 %
WBC: 5 10*3/uL (ref 3.8–10.8)

## 2022-07-11 LAB — HEMOGLOBIN A1C
Hgb A1c MFr Bld: 6.2 % of total Hgb — ABNORMAL HIGH (ref <5.7)
Mean Plasma Glucose: 131 mg/dL
eAG (mmol/L): 7.3 mmol/L

## 2022-07-19 NOTE — Progress Notes (Signed)
Subjective:   Patient ID: Susan Carr, female   DOB: 72 y.o.   MRN: [id number]   HPI Patient presents stating that there is discomfort on the dorsum of both feet around the lesser MPJs and does have an ingrown toenail that can be bothersome   ROS      Objective:  Physical Exam  Neurovascular status intact with inflammation of the second MPJ bilateral that becomes sore chronically over time and incurvated nail bed left hallux localized no erythema edema drainage     Assessment:  Capsulitis second MPJ bilateral along with ingrown toenail mild in its nature     Plan:  H&P reviewed both conditions sterile prep and did inject the joint 3 mg dexamethasone Kenalog 5 mg Xylocaine and trimmed out the nailbed and will be seen back for [place] to recheck as needed may require a permanent procedure

## 2022-07-31 DIAGNOSIS — N951 Menopausal and female climacteric states: Secondary | ICD-10-CM | POA: Diagnosis not present

## 2022-07-31 DIAGNOSIS — Z01419 Encounter for gynecological examination (general) (routine) without abnormal findings: Secondary | ICD-10-CM | POA: Diagnosis not present

## 2022-08-08 ENCOUNTER — Other Ambulatory Visit: Payer: Self-pay | Admitting: Obstetrics and Gynecology

## 2022-08-08 DIAGNOSIS — M8580 Other specified disorders of bone density and structure, unspecified site: Secondary | ICD-10-CM

## 2022-09-17 ENCOUNTER — Other Ambulatory Visit: Payer: Self-pay | Admitting: Internal Medicine

## 2022-09-17 DIAGNOSIS — Z1231 Encounter for screening mammogram for malignant neoplasm of breast: Secondary | ICD-10-CM

## 2022-10-01 ENCOUNTER — Other Ambulatory Visit: Payer: Self-pay | Admitting: Nurse Practitioner

## 2022-10-01 ENCOUNTER — Other Ambulatory Visit: Payer: Self-pay

## 2022-10-01 MED ORDER — SUCRALFATE 1 G PO TABS: ORAL_TABLET | ORAL | 1 refills | Status: DC

## 2022-10-14 ENCOUNTER — Encounter: Payer: Self-pay | Admitting: Internal Medicine

## 2022-10-14 ENCOUNTER — Other Ambulatory Visit: Payer: Medicare Other

## 2022-10-14 ENCOUNTER — Ambulatory Visit (INDEPENDENT_AMBULATORY_CARE_PROVIDER_SITE_OTHER): Payer: Medicare Other | Admitting: Internal Medicine

## 2022-10-14 VITALS — BP 138/70 | HR 94 | Temp 97.9°F | Resp 16 | Ht 63.5 in | Wt 184.2 lb

## 2022-10-14 DIAGNOSIS — R7309 Other abnormal glucose: Secondary | ICD-10-CM

## 2022-10-14 DIAGNOSIS — I700 Atherosclerosis of aorta: Secondary | ICD-10-CM | POA: Diagnosis not present

## 2022-10-14 DIAGNOSIS — Z79899 Other long term (current) drug therapy: Secondary | ICD-10-CM

## 2022-10-14 DIAGNOSIS — E559 Vitamin D deficiency, unspecified: Secondary | ICD-10-CM | POA: Diagnosis not present

## 2022-10-14 DIAGNOSIS — R0989 Other specified symptoms and signs involving the circulatory and respiratory systems: Secondary | ICD-10-CM | POA: Diagnosis not present

## 2022-10-14 DIAGNOSIS — E782 Mixed hyperlipidemia: Secondary | ICD-10-CM | POA: Diagnosis not present

## 2022-10-14 DIAGNOSIS — K219 Gastro-esophageal reflux disease without esophagitis: Secondary | ICD-10-CM | POA: Diagnosis not present

## 2022-10-14 DIAGNOSIS — G608 Other hereditary and idiopathic neuropathies: Secondary | ICD-10-CM | POA: Diagnosis not present

## 2022-10-14 MED ORDER — GABAPENTIN 300 MG PO CAPS
ORAL_CAPSULE | ORAL | 3 refills | Status: DC
Start: 2022-10-14 — End: 2022-12-06

## 2022-10-14 NOTE — Progress Notes (Signed)
Future Appointments  Date Time Provider Department  10/14/2022               3 mo ov  3:30 PM Lucky Cowboy, MD [facility]  04/13/2023              cpe  3:00 PM Adela Glimpse, NP [facility]  07/13/2023           wellness             4:00 PM Adela Glimpse, NP [facility]    History of Present Illness:       This very nice 72 y.o. WBF  presents for 3 month follow up with labile HTN, HLD, Pre-Diabetes and Vitamin D Deficiency. CT scan in 2019 showed Aortic Atherosclerosis.        Today patient is also c/o bilateral Lower Extremity tingling /numb paresthesias from proximal shins down to the feet /toes. Several years ago she had low normal Vitamin B12.  Gabapentin 100 mg  1-2 capsules /day has helped her discomfort.        Patient is monitored expectantly for HTN  since  & BP has been controlled at home. Today's BP is at goal - 138/70. Patient has had no complaints of any cardiac type chest pain, palpitations, dyspnea [name] /PND, dizziness, claudication or dependent edema.        Hyperlipidemia is not  controlled with diet & Ezetimkibe. Patient denies myalgias or other med SE's. Last Lipids were not at goal :  Lab Results  Component Value Date   CHOL 244 (H) 07/10/2022   HDL 60 07/10/2022   LDLCALC 161 (H) 07/10/2022   TRIG 116 07/10/2022   CHOLHDL 4.1 07/10/2022     Also, the patient has history of  PreDiabetes (A1c 5.9% /2014)  and has had no symptoms of reactive hypoglycemia, diabetic polys, paresthesias or visual blurring.  Last A1c was not at goal :  Lab Results  Component Value Date   HGBA1C 6.2 (H) 07/10/2022                                                        Further, the patient also has history of Vitamin D Deficiency ("25" /2008)  and supplements vitamin D . Last vitamin D was at goal :  Lab Results  Component Value Date   VD25OH 73 04/10/2022     Current Outpatient Medications on File Prior to Visit  Medication Sig   acyclovir (ZOVIRAX) 200  MG capsule Take 1 capsule 2 x /day as needed for Mouth Ulcers   CELEBREX 100 MG capsule Take  1 capsule Twice Daily as needed   VITAMIN D  5 ,000 Units  Take  daily.   eszopiclone (2 MG TABS tablet TAKE ONE TAB  AT BEDTIME AS NEEDED    ezetimibe (10 MG tablet Take  1 tablet  Daily  for Cholesterol   gabapentin  100 MG capsule 1 tablet   REFRESH DRY EYE THERAPY  Apply 1 drop to eye daily.   hydrOXYzine  50 MG tablet Take 1/2 to 1 tab at night for insomnia.   pantoprazole 40 MG tablet Take  1 tablet  Daily    sucralfate 1 g tablet  1 TABLET 4 TIMES DAILY   vitamin C 500 MG tablet Take  2  times daily.   vitamin E 100 UNIT capsule Take very morning.   Zinc 50 MG TABS Take 1 tablet Daily    No Known Allergies   PMHx:   Past Medical History:  Diagnosis Date   Arthritis    Hyperlipidemia    Labile hypertension      Immunization History  Administered Date(s) Administered   PFIZER  SARS-COV-2 Vacc 09/21/2019, 10/19/2019, 06/29/2020   Tdap 08/16/2012     Past Surgical History:  Procedure Laterality Date   NECK SURGERY     REDUCTION MAMMAPLASTY Bilateral 2007     FHx:    Reviewed / unchanged   SHx:    Reviewed / unchanged    Systems Review:  Constitutional: Denies fever, chills, wt changes, headaches, insomnia, fatigue, night sweats, change in appetite. Eyes: Denies redness, blurred vision, diplopia, discharge, itchy, watery eyes.  ENT: Denies discharge, congestion, post nasal drip, epistaxis, sore throat, earache, hearing loss, dental pain, tinnitus, vertigo, sinus pain, snoring.  CV: Denies chest pain, palpitations, irregular heartbeat, syncope, dyspnea, diaphoresis, orthopnea, PND, claudication or edema. Respiratory: denies cough, dyspnea, DOE, pleurisy, hoarseness, laryngitis, wheezing.  Gastrointestinal: Denies dysphagia, odynophagia, heartburn, reflux, water brash, abdominal pain or cramps, nausea, vomiting, bloating, diarrhea, constipation, hematemesis, melena,  hematochezia  or hemorrhoids. Genitourinary: Denies dysuria, frequency, urgency, nocturia, hesitancy, discharge, hematuria or flank pain. Musculoskeletal: Denies arthralgias, myalgias, stiffness, jt. swelling, pain, limping or strain/sprain.  Skin: Denies pruritus, rash, hives, warts, acne, eczema or change in skin lesion(s). Neuro: No weakness, tremor, incoordination, spasms, paresthesia or pain. Psychiatric: Denies confusion, memory loss or sensory loss. Endo: Denies change in weight, skin or hair change.  Heme/Lymph: No excessive bleeding, bruising or enlarged lymph nodes.   Physical Exam  BP 138/70   Pulse 94   Temp 97.9 F (36.6 C)   Resp 16   Ht 5' 3.5" (1.613 m)   Wt 184 lb 3.2 oz (83.6 kg)   SpO2 98%   BMI 32.12 kg/m   Appears  well nourished, well groomed  and in no distress.  Eyes: PERRLA, EOMs, conjunctiva no swelling or erythema. Sinuses: No frontal/maxillary tenderness ENT/Mouth: EAC's clear, TM's nl w/o erythema, bulging. Nares clear w/o erythema, swelling, exudates. Oropharynx clear without erythema or exudates. Oral hygiene is good. Tongue normal, non obstructing. Hearing intact.  Neck: Supple. Thyroid not palpable. Car 2+/2+ without bruits, nodes or JVD. Chest: Respirations nl with BS clear & equal w/o rales, rhonchi, wheezing or stridor.  Cor: Heart sounds normal w/ regular rate and rhythm without sig. murmurs, gallops, clicks or rubs. Peripheral pulses normal and equal  without edema.  Abdomen: Soft & bowel sounds normal. Non-tender w/o guarding, rebound, hernias, masses or organomegaly.  Lymphatics: Unremarkable.  Musculoskeletal: Full ROM all peripheral extremities, joint stability, 5/5 strength and normal gait.  Skin: Warm, dry without exposed rashes, lesions or ecchymosis apparent.  Neuro: Cranial nerves intact, reflexes equal bilaterally. Sensory-motor testing grossly intact. Tendon reflexes grossly intact.  Pysch: Alert & oriented x 3.  Insight and  judgement nl & appropriate. No ideations.   Assessment and Plan:   1. Labile hypertension  - Continue medication, monitor blood pressure at home.  - Continue DASH diet.  Reminder to go to the ER if any CP,  SOB, nausea, dizziness, severe HA, changes vision/speech.    - CBC with Differential/Platelet - COMPLETE METABOLIC PANEL WITH GFR - Magnesium - TSH  2. Hyperlipidemia, mixed  - Continue diet/meds, exercise,& lifestyle modifications.  - Continue monitor periodic cholesterol/liver &  renal functions     - Lipid panel - TSH  3. Abnormal glucose  - Continue diet, exercise  - Lifestyle modifications.  - Monitor appropriate labs   - Hemoglobin A1c - Insulin, random  4. Vitamin D deficiency  - Continue supplementation   - VITAMIN D 25 Hydroxy   5. Aortic atherosclerosis ([facility]) - per CT 05/2018  - Lipid panel  6. Gastroesophageal reflux disease  - CBC with Differential/Platelet  7. Medication management  - CBC with Differential/Platelet - COMPLETE METABOLIC PANEL WITH GFR - Magnesium - Lipid panel - TSH - Hemoglobin A1c - Insulin, random - VITAMIN D 25 Hydroxy          Discussed  regular exercise, BP monitoring, weight control to achieve/maintain BMI less than 25 and discussed med and SE's. Recommended labs to assess /monitor clinical status .  I discussed the assessment and treatment plan with the patient. The patient was provided an opportunity to ask questions and all were answered. The patient agreed with the plan and demonstrated an understanding of the instructions.  I provided over 30 minutes of exam, counseling, chart review and  complex critical decision making.        The patient was advised to call back or seek an in-person evaluation if the symptoms worsen or if the condition fails to improve as anticipated.   Marinus Maw, MD

## 2022-10-14 NOTE — Patient Instructions (Signed)

## 2022-10-15 ENCOUNTER — Other Ambulatory Visit: Payer: Self-pay | Admitting: Internal Medicine

## 2022-10-15 DIAGNOSIS — E782 Mixed hyperlipidemia: Secondary | ICD-10-CM

## 2022-10-15 LAB — CBC WITH DIFFERENTIAL/PLATELET
Absolute Monocytes: 338 cells/uL (ref 200–950)
Basophils Absolute: 39 cells/uL (ref 0–200)
Basophils Relative: 0.8 %
Eosinophils Absolute: 299 cells/uL (ref 15–500)
Eosinophils Relative: 6.1 %
HCT: 38.3 % (ref 35.0–45.0)
Hemoglobin: 12.9 g/dL (ref 11.7–15.5)
Lymphs Abs: 1695.4 cells/uL (ref 850–3900)
MCH: 31.5 pg (ref 27.0–33.0)
MCHC: 33.7 g/dL (ref 32.0–36.0)
MCV: 93.6 fL (ref 80.0–100.0)
MPV: 10.4 fL (ref 7.5–12.5)
Monocytes Relative: 6.9 %
Neutro Abs: 2528 cells/uL (ref 1500–7800)
Neutrophils Relative %: 51.6 %
Platelets: 274 10*3/uL (ref 140–400)
RBC: 4.09 10*6/uL (ref 3.80–5.10)
RDW: 12.2 % (ref 11.0–15.0)
Total Lymphocyte: 34.6 %
WBC: 4.9 10*3/uL (ref 3.8–10.8)

## 2022-10-15 LAB — COMPLETE METABOLIC PANEL WITH GFR
AG Ratio: 1.7 (calc) (ref 1.0–2.5)
ALT: 14 U/L (ref 6–29)
AST: 17 U/L (ref 10–35)
Albumin: 4.2 g/dL (ref 3.6–5.1)
Alkaline phosphatase (APISO): 61 U/L (ref 37–153)
BUN: 17 mg/dL (ref 7–25)
CO2: 27 mmol/L (ref 20–32)
Calcium: 9.1 mg/dL (ref 8.6–10.4)
Chloride: 108 mmol/L (ref 98–110)
Creat: 0.8 mg/dL (ref 0.60–1.00)
Globulin: 2.5 g/dL (calc) (ref 1.9–3.7)
Glucose, Bld: 101 mg/dL — ABNORMAL HIGH (ref 65–99)
Potassium: 4.1 mmol/L (ref 3.5–5.3)
Sodium: 142 mmol/L (ref 135–146)
Total Bilirubin: 0.5 mg/dL (ref 0.2–1.2)
Total Protein: 6.7 g/dL (ref 6.1–8.1)
eGFR: 78 mL/min/{1.73_m2} (ref >=60)

## 2022-10-15 LAB — INSULIN, RANDOM: Insulin: 40.9 u[IU]/mL — ABNORMAL HIGH

## 2022-10-15 LAB — MAGNESIUM: Magnesium: 2.1 mg/dL (ref 1.5–2.5)

## 2022-10-15 LAB — LIPID PANEL
Cholesterol: 217 mg/dL — ABNORMAL HIGH (ref <200)
HDL: 51 mg/dL (ref >=50)
LDL Cholesterol (Calc): 138 mg/dL (calc) — ABNORMAL HIGH
Non-HDL Cholesterol (Calc): 166 mg/dL (calc) — ABNORMAL HIGH (ref <130)
Total CHOL/HDL Ratio: 4.3 (calc) (ref <5.0)
Triglycerides: 152 mg/dL — ABNORMAL HIGH (ref <150)

## 2022-10-15 LAB — HEMOGLOBIN A1C
Hgb A1c MFr Bld: 6.2 % of total Hgb — ABNORMAL HIGH (ref <5.7)
Mean Plasma Glucose: 131 mg/dL
eAG (mmol/L): 7.3 mmol/L

## 2022-10-15 LAB — TSH: TSH: 0.38 mIU/L — ABNORMAL LOW (ref 0.40–4.50)

## 2022-10-15 LAB — VITAMIN D 25 HYDROXY (VIT D DEFICIENCY, FRACTURES): Vit D, 25-Hydroxy: 59 ng/mL (ref 30–100)

## 2022-10-15 MED ORDER — ROSUVASTATIN CALCIUM 20 MG PO TABS
ORAL_TABLET | ORAL | 3 refills | Status: DC
Start: 2022-10-15 — End: 2022-12-06

## 2022-10-18 ENCOUNTER — Encounter: Payer: Self-pay | Admitting: Internal Medicine

## 2022-10-21 NOTE — Progress Notes (Signed)
Patient is aware of lab results and instructions and will take the Rosuvastatin as prescribed. -e welch

## 2022-10-25 DIAGNOSIS — M65341 Trigger finger, right ring finger: Secondary | ICD-10-CM | POA: Diagnosis not present

## 2022-12-03 ENCOUNTER — Encounter: Payer: Self-pay | Admitting: Internal Medicine

## 2022-12-03 NOTE — Progress Notes (Signed)
Future Appointments  Date Time Provider Department  12/04/2022  3:30 PM Lucky Cowboy, MD [facility]  04/13/2023                              cpe    3:00 PM Adela Glimpse, NP [facility]  07/13/2023                             wellness  4:00 PM Adela Glimpse, NP [facility]    History of Present Illness:       This very nice 72 y.o. WBF  with labile HTN, HLD, Pre-Diabetes and Vitamin D Deficiency presents with c/o back pain     Current Outpatient Medications on File Prior to Visit  Medication Sig   acyclovir  200 MG capsule Take 1 capsule 2 x /day as needed for Mouth Ulcers   celecoxib  100 MG capsule Take  1 capsule Twice Daily as needed    VITAMIN D Take 5,000 Units  daily.   ezetimibe  10 MG tablet Take  1 tablet  Daily  for Cholesterol   REFRESH DRY EYE THERAPY Apply 1 drop to eye daily.   hydrOXYzine 50 MG tablet Take 1/2 to 1 tab at nigh   pantoprazole 40 MG tablet Take  1 tablet  Daily     rosuvastatin  20 MG tablet Take 1 tablet Daily    sucralfate 1 g tablet DISSOLVE 1 TABLET IN 2-3 OZ OF WATER 4 TIMES DAILY   vitamin C  500 MG tablet Take  2 times daily.   vitamin E 100 UNIT capsule Take  every morning.   Zinc 50 MG TABS Take 1 tablet Daily     No Known Allergies   Problem list She has Hyperlipidemia; Abnormal glucose; Vitamin D deficiency; Insomnia; Anxiety state; Medication management; Hypertension; Osteoporosis; GERD (gastroesophageal reflux disease); Overweight (BMI 25.0-29.9); Aortic atherosclerosis (HCC) - per CT 05/2018; DNR (do not resuscitate); and Chronic pain of both knees on their problem list.   Observations/Objective:  BP (!) 144/74   Pulse 88   Temp 97.7 F (36.5 C)   Resp 16   Ht 5' 3.5" (1.613 m)   Wt 181 lb 6.4 oz (82.3 kg)   SpO2 96%   BMI 31.63 kg/m   HEENT - WNL. Neck - supple.  Chest - Clear equal BS. Cor - Nl HS. RRR w/o sig MGR. PP 1(+). No edema. MS- FROM w/o deformities.   Gait Nl. (+) Tender Low para Lumbar  spasm & tenderness.  Tender Rt>Lt SI jt areas.  Neuro -  Nl w/o focal abnormalities.   Assessment and Plan:   1. Sacroiliac strain, initial encounter  - dexamethasone 4 MG tablet;  Take 1 tab 3 x day for 5 days, then 2 x day for 5 days, then 1 tab daily   Dispense: 30 tablet; Refill: 0  - cyclobenzaprine  10 MG tablet;    Take 1/2 to 1 tablet 3 x /day as needed for Muscle Spasm  Dispense: 90 tablet; Refill: 0  2. Achilles tendinitis of right lower extremity  - dexamethasone  4 MG tablet;  Take 1 tab 3 x day for 5 days, then 2 x day for 5 days, then 1 tab daily   Dispense: 30 tablet; Refill: 0    Follow Up Instructions:        I  discussed the assessment and treatment plan with the patient. The patient was provided an opportunity to ask questions and all were answered. The patient agreed with the plan and demonstrated an understanding of the instructions.       The patient was advised to call back or seek an in-person evaluation if the symptoms worsen or if the condition fails to improve as anticipated.    Marinus Maw, MD

## 2022-12-04 ENCOUNTER — Encounter: Payer: Self-pay | Admitting: Internal Medicine

## 2022-12-04 ENCOUNTER — Ambulatory Visit (INDEPENDENT_AMBULATORY_CARE_PROVIDER_SITE_OTHER): Payer: Medicare Other | Admitting: Internal Medicine

## 2022-12-04 VITALS — BP 144/74 | HR 88 | Temp 97.7°F | Resp 16 | Ht 63.5 in | Wt 181.4 lb

## 2022-12-04 DIAGNOSIS — S39012A Strain of muscle, fascia and tendon of lower back, initial encounter: Secondary | ICD-10-CM

## 2022-12-04 DIAGNOSIS — M7661 Achilles tendinitis, right leg: Secondary | ICD-10-CM

## 2022-12-04 MED ORDER — CYCLOBENZAPRINE HCL 10 MG PO TABS
ORAL_TABLET | ORAL | 0 refills | Status: AC
Start: 2022-12-04 — End: ?

## 2022-12-04 MED ORDER — DEXAMETHASONE 4 MG PO TABS
ORAL_TABLET | ORAL | 0 refills | Status: DC
Start: 2022-12-04 — End: 2023-01-13

## 2022-12-04 NOTE — Patient Instructions (Addendum)
Sacroiliac Joint Dysfunction  Sacroiliac joint dysfunction is a condition that causes inflammation on one or both sides of the sacroiliac (SI) joint. The SI joint is the joint between two bones of the pelvis called the sacrum and the ilium. The sacrum is the bone at the base of the spine. The ilium is the large bone that forms the hip. This condition causes deep aching or burning pain in the low back. In some cases, the pain may also spread into one or both buttocks, hips, or thighs. What are the causes? This condition may be caused by: Pregnancy. During pregnancy, extra stress is put on the SI joints because the pelvis widens. Injury, such as: Injuries from car crashes. Sports-related injuries. Work-related injuries. Having one leg that is shorter than the other. Conditions that affect the joints, such as: Rheumatoid arthritis. Gout. Psoriatic arthritis. Joint infection (septic arthritis). Sometimes, the cause of SI joint dysfunction is not known. What are the signs or symptoms? Symptoms of this condition include: Aching or burning pain in the lower back. The pain may also spread to other areas, such as: Buttocks. Groin. Thighs. Muscle spasms in or around the painful areas. Increased pain when standing, walking, running, stair climbing, bending, or lifting. How is this diagnosed? This condition is diagnosed with a physical exam and your medical history. During the exam, the health care provider may move one or both of your legs to different positions to check for pain. Various tests may be done to confirm the diagnosis, including: Imaging tests to look for other causes of pain. These may include: MRI. CT scan. Bone scan. Diagnostic injection. A numbing medicine is injected into the SI joint using a needle. If your pain is temporarily improved or stopped after the injection, this can indicate that SI joint dysfunction is the problem. How is this treated? Treatment depends on the  cause and severity of your condition. Treatment options can be noninvasive and may include: Ice or heat applied to the lower back area after an injury. This may help reduce pain and muscle spasms. Medicines to relieve pain or inflammation or to relax the muscles. Wearing a back brace (sacroiliac brace) to help support the joint while your back is healing. Physical therapy to increase muscle strength around the joint and flexibility at the joint. This may also involve learning proper body positions and ways of moving to relieve stress on the joint. Direct manipulation of the SI joint. Use of a device that provides electrical stimulation to help reduce pain at the joint. Other treatments may include: Injections of steroid medicine into the joint to reduce pain and swelling. Radiofrequency ablation. This treatment uses heat to burn away nerves that are carrying pain messages from the joint. Surgery to put in screws and plates that limit or prevent joint motion. This is rare. Follow these instructions at home: Medicines Take over-the-counter and prescription medicines only as told by your health care provider. Ask your health care provider if the medicine prescribed to you: Requires you to avoid driving or using machinery. Can cause constipation. You may need to take these actions to prevent or treat constipation: Drink enough fluid to keep your urine pale yellow. Take over-the-counter or prescription medicines. Eat foods that are high in fiber, such as beans, whole grains, and fresh fruits and vegetables. Limit foods that are high in fat and processed sugars, such as fried or sweet foods. If you have a brace: Wear the brace as told by your health care provider.  Remove it only as told by your health care provider. Keep the brace clean. If the brace is not waterproof: Do not let it get wet. Cover it with a watertight covering when you take a bath or a shower. Managing pain, stiffness, and  swelling     Icing can help with pain and swelling. Heat may help with muscle tension or spasms. Ask your health care provider if you should use ice or heat. If directed, put ice on the affected area: If you have a removable brace, remove it as told by your health care provider. Put ice in a plastic bag. Place a towel between your skin and the bag. Leave the ice on for 20 minutes, 2-3 times a day. Remove the ice if your skin turns bright red. This is very important. If you cannot feel pain, heat, or cold, you have a greater risk of damage to the area. If directed, apply heat to the affected area as often as told by your health care provider. Use the heat source that your health care provider recommends, such as a moist heat pack or a heating pad. Place a towel between your skin and the heat source. Leave the heat on for 20-30 minutes. Remove the heat if your skin turns bright red. This is especially important if you are unable to feel pain, heat, or cold. You may have a greater risk of getting burned. General instructions Rest as needed. Return to your normal activities as told by your health care provider. Ask your health care provider what activities are safe for you. Do exercises as told by your health care provider or physical therapist. Keep all follow-up visits. This is important. Contact a health care provider if: Your pain is not controlled with medicine. You have a fever. Your pain is getting worse. Get help right away if: You have weakness, numbness, or tingling in your legs or feet. You lose control of your bladder or bowels. Summary Sacroiliac (SI) joint dysfunction is a condition that causes inflammation on one or both sides of the SI joint. This condition causes deep aching or burning pain in the low back. In some cases, the pain may also spread into one or both buttocks, hips, or thighs. Treatment depends on the cause and severity of your condition. It may include  medicines to reduce pain and swelling or to relax muscles. ====================================== Achilles Tendinitis  Achilles tendinitis is inflammation of the tough, cord-like band that connects the lower leg muscles to the heel bone (Achilles tendon). This is often caused by using the tendon and ankle joint too much. In most cases, Achilles tendinitis gets better over time with treatment and home care. It can take weeks or months to fully heal. What are the causes? This condition may be caused by: A sudden increase in exercise or activity, such as running. Doing the same exercises or activities, such as jumping, over and over. Not warming up your calf muscles before you exercise. Exercising in shoes that are worn out or not made for exercise. Having arthritis or a bone growth (spur) on the back of your heel. This can rub against the tendon and hurt it. Age-related wear and tear. Tendons become less flexible with age and are more likely to be injured. What are the signs or symptoms? Common symptoms of this condition include: Pain in your Achilles tendon or in the back of your leg, just above your heel. The pain may get worse when you exercise. Stiffness or soreness  in the back of your leg. You may feel it most often in the morning. Swelling of the skin over the Achilles tendon. Thickening of the tendon. Trouble standing on tiptoe. How is this diagnosed? This condition is diagnosed based on your symptoms and a physical exam. You may also have tests, such as: X-rays. MRI. Ultrasound. How is this treated? The goal of treatment is to relieve symptoms and help your injury heal. You may need to: Decrease or stop activities that caused the tendinitis. You may be told to switch to low-impact exercises like biking or swimming. Ice the injured area. Do physical therapy. This may include strengthening and stretching exercises. Take NSAIDs, such as ibuprofen. These can help with pain and  swelling. Use supportive shoes, wraps, heel lifts, or a walking boot (air cast). Have surgery. This may be done if your symptoms do not get better with other treatments. Use high-energy waves to start the healing process (extracorporeal shock wave therapy). This is rare. Get an injection of medicines that help with inflammation (corticosteroids). This is rare. Follow these instructions at home: If you have an air cast: Wear the air cast as told by your health care provider. Remove it only as told by your provider. Check the skin around the air cast every day. Tell your provider about any concerns. Loosen the air cast if your toes tingle, become numb, or turn cold and blue. Keep the air cast clean. If the air cast is not waterproof: Do not let it get wet. Cover it with a watertight covering when you take a bath or shower. Managing pain, stiffness, and swelling If told, put ice on the injured area. If you have a removable air cast, remove it as told by your provider. Put ice in a plastic bag. Place a towel between your skin and the bag. Leave the ice on for 20 minutes, 2-3 times a day. If your skin turns bright red, remove the ice right away to prevent skin damage. The risk of damage is higher if you cannot feel pain, heat, or cold. Move your toes often to reduce stiffness and swelling. Raise (elevate) your foot above the level of your heart while you are sitting or lying down. Activity Do not do activities that cause pain. Ask your provider when it is safe to drive if you have an air cast on your foot. If you go to physical therapy, do exercises as told by your provider or therapist. Return to your normal activities as told by your provider. Ask your provider what activities are safe for you. General instructions Take over-the-counter and prescription medicines only as told by your provider. If told, wrap your foot with an elastic bandage or other wrap. This can help to keep your tendon  from moving too much while it heals. Your provider will show you how to wrap your foot. Wear supportive shoes or heel lifts only as told by your provider. Contact a health care provider if: Your symptoms get worse. Your pain does not get better with medicine. You have new symptoms that you cannot explain. You have warmth and swelling in your foot. You have a fever. Get help right away if: You hear a sudden popping sound in your Achilles tendon and then have severe pain. You cannot move your toes or foot. You cannot put any weight on your foot. Your foot or toes become numb and look white or blue even after you loosen your bandage or air cast. This information is not  intended to replace advice given to you by your health care provider. Make sure you discuss any questions you have with your health care provider. Document Revised: 03/21/2022 Document Reviewed: 03/21/2022 Elsevier Patient Education  2024 [employer].

## 2022-12-25 ENCOUNTER — Other Ambulatory Visit: Payer: Self-pay | Admitting: Nurse Practitioner

## 2022-12-29 ENCOUNTER — Other Ambulatory Visit: Payer: Self-pay | Admitting: Nurse Practitioner

## 2023-01-01 DIAGNOSIS — M65341 Trigger finger, right ring finger: Secondary | ICD-10-CM | POA: Diagnosis not present

## 2023-01-13 ENCOUNTER — Encounter: Payer: Self-pay | Admitting: Nurse Practitioner

## 2023-01-13 ENCOUNTER — Ambulatory Visit
Admission: RE | Admit: 2023-01-13 | Discharge: 2023-01-13 | Disposition: A | Discharge status: Home / Self Care | Payer: Medicare Other | Source: Ambulatory Visit | Attending: Nurse Practitioner | Admitting: Nurse Practitioner

## 2023-01-13 ENCOUNTER — Ambulatory Visit (INDEPENDENT_AMBULATORY_CARE_PROVIDER_SITE_OTHER): Payer: Medicare Other | Admitting: Nurse Practitioner

## 2023-01-13 VITALS — BP 122/68 | HR 91 | Temp 97.3°F | Ht 63.5 in | Wt 179.6 lb

## 2023-01-13 DIAGNOSIS — M79671 Pain in right foot: Secondary | ICD-10-CM

## 2023-01-13 DIAGNOSIS — G608 Other hereditary and idiopathic neuropathies: Secondary | ICD-10-CM

## 2023-01-13 DIAGNOSIS — M7661 Achilles tendinitis, right leg: Secondary | ICD-10-CM

## 2023-01-13 DIAGNOSIS — M810 Age-related osteoporosis without current pathological fracture: Secondary | ICD-10-CM | POA: Diagnosis not present

## 2023-01-13 NOTE — Patient Instructions (Signed)
Heel Spur  A heel spur is a bony growth that forms on the bottom of the heel bone (calcaneus). Heel spurs are common. They often cause inflammation in the plantar fascia, which is the band of tissue that connects the toe bones to the heel bone. When the plantar fascia is inflamed, it is called plantar fasciitis. This may cause pain on the bottom of the foot, near the heel. Many people with plantar fasciitis also have heel spurs. However, spurs are not the cause of plantar fasciitis pain. What are the causes? The exact cause of heel spurs is not known. They may be caused by: Pressure on the heel bone. Bands of tissues that connect muscle to bone (tendons) pulling on the heel bone. What increases the risk? You are more likely to develop this condition if you: Are older than age 40. Are overweight. Have wear-and-tear arthritis (osteoarthritis). Have plantar fascia inflammation. Participate in sports or activities that include a lot of running or jumping. Wear poorly fitted shoes. What are the signs or symptoms? Some people have no symptoms. If you do have symptoms, they may include: Pain in the bottom of your heel. Pain that is worse when you first get out of bed. Pain that gets worse after walking or standing. How is this diagnosed? This condition may be diagnosed based on: Your symptoms and medical history. A physical exam. A foot X-ray. How is this treated? Treatment for this condition depends on how much pain you have. Treatment options may include: Doing stretching exercises and losing weight, if necessary. Wearing specific shoes or inserts inside of shoes (orthotics) for comfort and support. Wearing splints on your feet while you sleep. Splints keep your feet in a position (usually a 90-degree angle) that should prevent and relieve the pain you feel when you first get out of bed. They also make stretching easier in the morning. Taking over-the-counter medicine to relieve pain, such  as NSAIDs. Using high-intensity sound waves to break up the heel spur (extracorporeal shock wave therapy). Getting steroid injections in your heel to reduce inflammation. Having surgery, if your heel spur causes long-term (chronic) pain. Follow these instructions at home:  Activity Avoid activities that cause pain until you recover, or for as long as told by your health care provider. Do stretching exercises as told. Stretch before exercising or being physically active. Managing pain, stiffness, and swelling If directed, put ice on your foot. To do this: Put ice in a plastic bag. Place a towel between your skin and the bag. Leave the ice on for 20 minutes, 2-3 times a day. Remove the ice if your skin turns bright red. This is very important. If you cannot feel pain, heat, or cold, you have a greater risk of damage to the area. Move your toes often to reduce stiffness and swelling. When possible, raise (elevate) your foot above the level of your heart while you are sitting or lying down. General instructions Take over-the-counter and prescription medicines only as told by your health care provider. Wear supportive shoes that fit well. Wear splints, inserts, or orthotics as told by your health care provider. If recommended, work with your health care provider to lose weight. This can relieve pressure on your foot. Do not use any products that contain nicotine or tobacco, such as cigarettes, e-cigarettes, and chewing tobacco. These can delay bone healing. If you need help quitting, ask your health care provider. Keep all follow-up visits. This is important. Where to find more information [employer]   [employer]: www.orthoinfo.aaos.org Contact a health care provider if: Your pain does not go away with treatment. Your pain gets worse. Summary A heel spur is a bony growth that forms on the bottom of the heel bone (calcaneus). Heel spurs often cause inflammation in the plantar  fascia, which is the band of tissue that connects the toes to the heel bone. This may cause pain on the bottom of the foot, near the heel. Doing stretching exercises, losing weight, wearing specific shoes or shoe inserts, wearing splints while you sleep, and taking pain medicine may ease the pain and stiffness. Other treatment options may include high-intensity sound waves to break up the heel spur, steroid injections, or surgery. This information is not intended to replace advice given to you by your health care provider. Make sure you discuss any questions you have with your health care provider. Document Revised: 10/25/2019 Document Reviewed: 10/25/2019 Elsevier Patient Education  2024 [employer]  

## 2023-01-13 NOTE — Progress Notes (Signed)
Assessment and Plan:  Susan Carr was seen today for an episodic visit.  Diagnoses and all order for this visit:  1. Intractable right heel pain Take Celebrex daily for the next week. Assess for improvement in pain. Suggested heel insert support. Will obtain right heel x-ray to assess for spurring versus plantar fasciitis.  - DG Os Calcis Right; Future  2. Achilles tendinitis of right lower extremity History of achilles tendinitis however recently well controlled  Possible contributor to plantar fasciitis symptoms Continue to monitor.  3. Peripheral sensory neuropathy Continue Gabapentin as directed.  4. Age-related osteoporosis without current pathological fracture Continue Vitamin D as directed. Pursue a combination of weight-bearing exercises and strength training. Advised on fall prevention measures including proper lighting in all rooms, removal of area rugs and floor clutter, use of walking devices as deemed appropriate, avoidance of uneven walking surfaces. Smoking cessation and moderate alcohol consumption if applicable Consume 800 to 1000 IU of vitamin D daily with a goal vitamin D serum value of 30 ng/mL or higher. Aim for 1000 to 1200 mg of elemental calcium daily through supplements and/or dietary sources.   Notify office for further evaluation and treatment, questions or concerns if s/s fail to improve. The risks and benefits of my recommendations, as well as other treatment options were discussed with the patient today. Questions were answered.  Further disposition pending results of labs. Discussed med's effects and SE's.    Over 15 minutes of exam, counseling, chart review, and critical decision making was performed.   Future Appointments  Date Time Provider Department Center  03/10/2023  3:00 PM [facility] DX DEXA 1 [facility]  03/10/2023  3:30 PM [facility] MM 2 [facility]  04/13/2023  3:00 PM Nakiesha Rumsey, NP [facility] None   07/13/2023  4:00 PM Reakwon Barren, NP [facility] None    ------------------------------------------------------------------------------------------------------------------   HPI BP 122/68   Pulse 91   Temp (!) 97.3 F (36.3 C)   Ht 5' 3.5" (1.613 m)   Wt 179 lb 9.6 oz (81.5 kg)   SpO2 99%   BMI 31.32 kg/m   72 y.o.female presents for evaluation of right heel pain that occurred 2 weeks ago without injury.  She does have a hx of peripheral neuropathy to which she takes Gabapentin.  She also has associated achilles tendinitis.  States that the heel will hurt in the mornings when she stands from sleeping.  She can rest and heel will begin to hurt again upon walking. She has used inserts in the past with some benefit. Described as sharp shooting pain concentrated in the heel area.  Pain does not radiate across foot.  She has Celebrex on hand to which she takes as needed.  She denies any injury, fall, trauma recently to the right foot or RLE.  Past Medical History:  Diagnosis Date   Arthritis    Hyperlipidemia    Labile hypertension      No Known Allergies  Current Outpatient Medications on File Prior to Visit  Medication Sig   acyclovir (ZOVIRAX) 200 MG capsule Take 1 capsule 2 x /day as needed for Mouth Ulcers   celecoxib (CELEBREX) 100 MG capsule Take  1 capsule Twice Daily as needed with Food for Pain & Inflammation   Cholecalciferol (VITAMIN D PO) Take 5,000 Units by mouth daily.   ezetimibe (ZETIA) 10 MG tablet Take  1 tablet  Daily  for Cholesterol   gabapentin (NEURONTIN) 100 MG capsule    Glycerin-Polysorbate 80 (REFRESH  DRY EYE THERAPY OP) Apply 1 drop to eye daily.   hydrOXYzine (ATARAX) 50 MG tablet TAKE ONE-HALF TO ONE (0.5-1) TABLET BY MOUTH AT NIGHT FOR INSOMNIA   pantoprazole (PROTONIX) 40 MG tablet Take  1 tablet  Daily  to Prevent Heartburn and Indigestion   sucralfate (CARAFATE) 1 g tablet DISSOLVE 1 TABLET IN 2-3 OZ OF WATER AND DRINK 30 MINUTES BEFORE EACH  MEAL AND DINNER(4 TIMES DAILY)   vitamin C (ASCORBIC ACID) 500 MG tablet Take 500 mg by mouth 2 (two) times daily.   vitamin E 100 UNIT capsule Take 100 Units by mouth every morning.   Zinc 50 MG TABS Take 1 tablet Daily   cyclobenzaprine (FLEXERIL) 10 MG tablet Take 1/2 to 1 tablet 3 x /day as needed for Muscle Spasm (Patient not taking: Reported on 01/13/2023)   dexamethasone (DECADRON) 4 MG tablet Take 1 tab 3 x day for 5 days, then 2 x day for 5 days, then 1 tab daily   No current facility-administered medications on file prior to visit.    ROS: all negative except what is noted in the HPI.   Physical Exam:  BP 122/68   Pulse 91   Temp (!) 97.3 F (36.3 C)   Ht 5' 3.5" (1.613 m)   Wt 179 lb 9.6 oz (81.5 kg)   SpO2 99%   BMI 31.32 kg/m   General Appearance: NAD.  Awake, conversant and cooperative. Eyes: PERRLA, EOMs intact.  Sclera white.  Conjunctiva without erythema. Sinuses: No frontal/maxillary tenderness.  No nasal discharge. Nares patent.  ENT/Mouth: Ext aud canals clear.  Bilateral TMs w/DOL and without erythema or bulging. Hearing intact.  Posterior pharynx without swelling or exudate.  Tonsils without swelling or erythema.  Neck: Supple.  No masses, nodules or thyromegaly. Respiratory: Effort is regular with non-labored breathing. Breath sounds are equal bilaterally without rales, rhonchi, wheezing or stridor.  Cardio: RRR with no MRGs. Brisk peripheral pulses without edema.  Abdomen: Active BS in all four quadrants.  Soft and non-tender without guarding, rebound tenderness, hernias or masses. Lymphatics: Non tender without lymphadenopathy.  Musculoskeletal: RLE heel tender to palpation. Full ROM, 5/5 strength, normal ambulation.  No clubbing or cyanosis. Skin: Appropriate color for ethnicity. Warm without rashes, lesions, ecchymosis, ulcers.  Neuro: CN II-XII grossly normal. Normal muscle tone without cerebellar symptoms and intact sensation.   Psych: AO X 3,   appropriate mood and affect, insight and judgment.     Adela Glimpse, NP 2:41 PM [facility]

## 2023-01-29 ENCOUNTER — Encounter: Payer: Self-pay | Admitting: Internal Medicine

## 2023-01-29 ENCOUNTER — Telehealth: Payer: Self-pay | Admitting: Nurse Practitioner

## 2023-01-29 NOTE — Telephone Encounter (Signed)
Patient received foot x-ray results on mychart portal. She is established with Dr. Jerl Santos and is going to call him and see if she can schedule with him without having to have a new ref. But she would also like a ref. To a Podiatrist for her heel spur and possible ingrown toenail.

## 2023-01-30 DIAGNOSIS — H04123 Dry eye syndrome of bilateral lacrimal glands: Secondary | ICD-10-CM | POA: Diagnosis not present

## 2023-02-01 ENCOUNTER — Encounter: Payer: Self-pay | Admitting: Nurse Practitioner

## 2023-02-02 DIAGNOSIS — M79671 Pain in right foot: Secondary | ICD-10-CM | POA: Diagnosis not present

## 2023-02-02 DIAGNOSIS — M79672 Pain in left foot: Secondary | ICD-10-CM | POA: Diagnosis not present

## 2023-02-03 ENCOUNTER — Encounter: Payer: Self-pay | Admitting: Nurse Practitioner

## 2023-02-03 NOTE — Telephone Encounter (Signed)
Spoke with her son, will relay the message

## 2023-03-06 DIAGNOSIS — M79671 Pain in right foot: Secondary | ICD-10-CM | POA: Diagnosis not present

## 2023-03-10 ENCOUNTER — Ambulatory Visit: Admission: RE | Admit: 2023-03-10 | Payer: Medicare Other | Source: Ambulatory Visit

## 2023-03-10 ENCOUNTER — Encounter: Payer: Self-pay | Admitting: Nurse Practitioner

## 2023-03-10 ENCOUNTER — Ambulatory Visit
Admission: RE | Admit: 2023-03-10 | Discharge: 2023-03-10 | Disposition: A | Discharge status: Home / Self Care | Payer: Medicare Other | Source: Ambulatory Visit | Attending: Obstetrics and Gynecology | Admitting: Obstetrics and Gynecology

## 2023-03-10 ENCOUNTER — Ambulatory Visit: Payer: Medicare Other

## 2023-03-10 DIAGNOSIS — M8588 Other specified disorders of bone density and structure, other site: Secondary | ICD-10-CM | POA: Diagnosis not present

## 2023-03-10 DIAGNOSIS — Z1231 Encounter for screening mammogram for malignant neoplasm of breast: Secondary | ICD-10-CM

## 2023-03-10 DIAGNOSIS — N958 Other specified menopausal and perimenopausal disorders: Secondary | ICD-10-CM | POA: Diagnosis not present

## 2023-03-10 DIAGNOSIS — M8580 Other specified disorders of bone density and structure, unspecified site: Secondary | ICD-10-CM

## 2023-03-10 DIAGNOSIS — E349 Endocrine disorder, unspecified: Secondary | ICD-10-CM | POA: Diagnosis not present

## 2023-04-10 ENCOUNTER — Other Ambulatory Visit: Payer: Self-pay | Admitting: Internal Medicine

## 2023-04-10 DIAGNOSIS — S39012A Strain of muscle, fascia and tendon of lower back, initial encounter: Secondary | ICD-10-CM

## 2023-04-13 ENCOUNTER — Encounter: Payer: Medicare Other | Admitting: Nurse Practitioner

## 2023-04-23 ENCOUNTER — Encounter: Payer: Medicare Other | Admitting: Nurse Practitioner

## 2023-04-30 ENCOUNTER — Other Ambulatory Visit: Payer: Self-pay | Admitting: Internal Medicine

## 2023-04-30 DIAGNOSIS — B029 Zoster without complications: Secondary | ICD-10-CM

## 2023-04-30 MED ORDER — ACYCLOVIR 200 MG PO CAPS
ORAL_CAPSULE | ORAL | 3 refills | Status: AC
Start: 2023-04-30 — End: ?

## 2023-05-07 ENCOUNTER — Ambulatory Visit (INDEPENDENT_AMBULATORY_CARE_PROVIDER_SITE_OTHER): Payer: Medicare Other | Admitting: Nurse Practitioner

## 2023-05-07 ENCOUNTER — Encounter: Payer: Self-pay | Admitting: Nurse Practitioner

## 2023-05-07 VITALS — BP 122/72 | HR 87 | Temp 98.0°F | Ht 64.0 in | Wt 181.6 lb

## 2023-05-07 DIAGNOSIS — R0989 Other specified symptoms and signs involving the circulatory and respiratory systems: Secondary | ICD-10-CM | POA: Diagnosis not present

## 2023-05-07 DIAGNOSIS — Z79899 Other long term (current) drug therapy: Secondary | ICD-10-CM

## 2023-05-07 DIAGNOSIS — E785 Hyperlipidemia, unspecified: Secondary | ICD-10-CM | POA: Diagnosis not present

## 2023-05-07 DIAGNOSIS — G8929 Other chronic pain: Secondary | ICD-10-CM | POA: Diagnosis not present

## 2023-05-07 DIAGNOSIS — Z0000 Encounter for general adult medical examination without abnormal findings: Secondary | ICD-10-CM

## 2023-05-07 DIAGNOSIS — F411 Generalized anxiety disorder: Secondary | ICD-10-CM

## 2023-05-07 DIAGNOSIS — Z1389 Encounter for screening for other disorder: Secondary | ICD-10-CM | POA: Diagnosis not present

## 2023-05-07 DIAGNOSIS — E559 Vitamin D deficiency, unspecified: Secondary | ICD-10-CM

## 2023-05-07 DIAGNOSIS — K219 Gastro-esophageal reflux disease without esophagitis: Secondary | ICD-10-CM | POA: Diagnosis not present

## 2023-05-07 DIAGNOSIS — Z136 Encounter for screening for cardiovascular disorders: Secondary | ICD-10-CM | POA: Diagnosis not present

## 2023-05-07 DIAGNOSIS — I700 Atherosclerosis of aorta: Secondary | ICD-10-CM | POA: Diagnosis not present

## 2023-05-07 DIAGNOSIS — M25562 Pain in left knee: Secondary | ICD-10-CM | POA: Diagnosis not present

## 2023-05-07 DIAGNOSIS — Z0001 Encounter for general adult medical examination with abnormal findings: Secondary | ICD-10-CM

## 2023-05-07 DIAGNOSIS — M79671 Pain in right foot: Secondary | ICD-10-CM

## 2023-05-07 DIAGNOSIS — M810 Age-related osteoporosis without current pathological fracture: Secondary | ICD-10-CM

## 2023-05-07 DIAGNOSIS — M25561 Pain in right knee: Secondary | ICD-10-CM | POA: Diagnosis not present

## 2023-05-07 DIAGNOSIS — M7661 Achilles tendinitis, right leg: Secondary | ICD-10-CM

## 2023-05-07 DIAGNOSIS — R7309 Other abnormal glucose: Secondary | ICD-10-CM | POA: Diagnosis not present

## 2023-05-07 DIAGNOSIS — G4700 Insomnia, unspecified: Secondary | ICD-10-CM

## 2023-05-07 DIAGNOSIS — E663 Overweight: Secondary | ICD-10-CM

## 2023-05-07 NOTE — Progress Notes (Signed)
CPE  Assessment and Plan:  CPE Due annually  Health maintenance reviewed DNR on file  Atherosclerosis of aorta (HCC)- per CT 05/2018 Control blood pressure, cholesterol, glucose, increase exercise.   Labile hypertension Discussed DASH (Dietary Approaches to Stop Hypertension) DASH diet is lower in sodium than a typical American diet. Cut back on foods that are high in saturated fat, cholesterol, and trans fats. Eat more whole-grain foods, fish, poultry, and nuts Remain active and exercise as tolerated daily.  Monitor BP at home-Call if greater than 130/80.  Check CMP/CBC  Gastroesophageal reflux disease, esophagitis presence not specified No suspected reflux complications (Barret/stricture). Lifestyle modification:  wt loss, avoid meals 2-3h before bedtime. Consider eliminating food triggers:  chocolate, caffeine, EtOH, acid/spicy food.  Vitamin D deficiency Continue supplement Monitor levels  Medication management All medications discussed and reviewed in full. All questions and concerns regarding medications addressed.    Age-related osteoporosis without current pathological fracture Discussed starting Fosamax vs Prolia - patient wishes to do research. Pursue a combination of weight-bearing exercises and strength training. Advised on fall prevention measures including proper lighting in all rooms, removal of area rugs and floor clutter, use of walking devices as deemed appropriate, avoidance of uneven walking surfaces. Smoking cessation and moderate alcohol consumption if applicable Consume 800 to 1000 IU of vitamin D daily with a goal vitamin D serum value of 30 ng/mL or higher. Aim for 1000 to 1200 mg of elemental calcium daily through supplements and/or dietary sources.   Hyperlipidemia, unspecified hyperlipidemia type Discussed lifestyle modifications. Recommended diet heavy in fruits and veggies, omega 3's. Decrease consumption of animal meats, cheeses, and dairy  products. Remain active and exercise as tolerated. Continue to monitor. Check lipids/TSH  Abnormal glucose Education: Reviewed 'ABCs' of diabetes management  Discussed goals to be met and/or maintained include A1C (<7) Blood pressure (<130/80) Cholesterol (LDL <70) Continue Eye Exam yearly  Continue Dental Exam Q6 mo Discussed dietary recommendations Discussed Physical Activity recommendations Check A1C  Insomnia, unspecified type Tilda Burrow works well Discussed good sleep hygiene. Establish bed and wake times. Sleep restriction-only sleep estimated hrs sleep. Bed only for sex and sleep, only sleep when sleepy, out of bed if anxious (stimulus control). Reviewed relaxation techniques, mindful meditations. Expected sleep duration. Addressed worries about not sleeping.   Anxiety state Reviewed relaxation techniques.  Sleep hygiene. Recommended Cognitive Behavioral Therapy (CBT). Recommended mindfulness meditation and exercise.   Encouraged personality growth wand development through coping techniques and problem-solving skills. Limit/Decrease/Monitor drug/alcohol intake.    Overweight (BMI 25.0-29.9) Discussed appropriate BMI Diet modification. Physical activity. Encouraged/praised to build confidence.  Bil knee pain Declines imaging, est with ortho, declines follow up at this time Monitor  Achilles pain/heel pain Will uric acid levels - continue to follow with orthopedics.  Screening for blood or protein in urine Check and monitor Urinalysis/Microalbumin Routine w reflex microscopic  Orders Placed This Encounter  Procedures   CBC with Differential/Platelet   COMPLETE METABOLIC PANEL WITH GFR   Magnesium   Lipid panel   TSH   Hemoglobin A1c   Insulin, random   VITAMIN D 25 Hydroxy (Vit-D Deficiency, Fractures)   Urinalysis, Routine w reflex microscopic   Microalbumin / creatinine urine ratio   Uric acid   EKG 12-Lead   Notify office for further evaluation  and treatment, questions or concerns if any reported s/s fail to improve.   The patient was advised to call back or seek an in-person evaluation if any symptoms worsen or if the condition  fails to improve as anticipated.   Further disposition pending results of labs. Discussed med's effects and SE's.    I discussed the assessment and treatment plan with the patient. The patient was provided an opportunity to ask questions and all were answered. The patient agreed with the plan and demonstrated an understanding of the instructions.  Discussed med's effects and SE's. Screening labs and tests as requested with regular follow-up as recommended.  I provided 35 minutes of face-to-face time during this encounter including counseling, chart review, and critical decision making was preformed.   Future Appointments  Date Time Provider Department Center  08/13/2023  3:30 PM Adela Glimpse, NP [facility] None  05/09/2024  3:00 PM Adela Glimpse, NP [facility] None     During the course of the visit the patient was educated and counseled about appropriate screening and preventive services including:   Pneumococcal vaccine  Prevnar 13 Influenza vaccine Td vaccine Screening electrocardiogram Bone densitometry screening Colorectal cancer screening Diabetes screening Glaucoma screening Nutrition counseling  Advanced directives: requested  HPI  72 y.o. female  presents for a CPE. She has Hyperlipidemia; Abnormal glucose; Vitamin D deficiency; Insomnia; Anxiety state; Medication management; Hypertension; Osteoporosis; GERD (gastroesophageal reflux disease); Overweight (BMI 25.0-29.9); Aortic atherosclerosis (HCC) - per CT 05/2018; DNR (do not resuscitate); and Chronic pain of both knees on their problem list.  Overall she reports feeling well today.    She continues to be concerned for ongoing right ankle pain.  Started as what she thought was planta fascitis.  States that she was referred to PT  but has not continued to follow through.  She plans to reach  out to orthopedics for updated apt and will consider steroid injections.  She does have bil knee pain suspect for arthritis, hasn't seen ortho, has been wearing knee compression, taking celebrex with good results. She has seen Dr. Fara Chute in the past for shoulder. 04/09/22:  Saw [facility] for follow up.  She did not feel as though tht PT helped her knees very much and made things worse.  She has been off Celebrex for several months.    She take lunesta for sleep, also reports taking ashwaganda and melatonin which she feels is helpful. She does not take the [place] nightly, only when needed.    She has GERD, taking protonix 40 mg daily in AM, reports having some sx in the evening, will intermittently have sense of food "dragging" in chest.   BMI is Body mass index is 31.17 kg/m., she has been working on diet and exercise Wt Readings from Last 3 Encounters:  05/07/23 181 lb 9.6 oz (82.4 kg)  01/13/23 179 lb 9.6 oz (81.5 kg)  12/04/22 181 lb 6.4 oz (82.3 kg)   Her blood pressure has been controlled at home, states up from rushing, today their BP is BP: 122/72.  She does workout and also fairly active job. She denies chest pain, shortness of breath, dizziness.    She has aortic atherosclerosis per CT 05/2018.   She is on cholesterol medication, JUST zetia. Mostly eats poultry. She has taken rosuvastatin 40 mg in the past but stopped due to myalgias, unsure if tried low dose low frequency or other statins. Her cholesterol is at goal. The cholesterol last visit was:   Lab Results  Component Value Date   CHOL 217 (H) 10/14/2022   HDL 51 10/14/2022   LDLCALC 138 (H) 10/14/2022   TRIG 152 (H) 10/14/2022   CHOLHDL 4.3 10/14/2022  . She has  been working on diet and exercise for prediabetes, she is on bASA, she is on ACE/ARB and denies foot ulcerations, hyperglycemia, hypoglycemia , increased appetite, nausea, paresthesia of the  feet, polydipsia, polyuria, visual disturbances, vomiting and weight loss. Last A1C in the office was:  Lab Results  Component Value Date   HGBA1C 6.2 (H) 10/14/2022  .  Patient is on Vitamin D supplement.   Lab Results  Component Value Date   VD25OH 50 10/14/2022     She had updated DEXA scan that showed T Score of -2.6.  She takes Vit D daily.    Current Medications:  Current Outpatient Medications on File Prior to Visit  Medication Sig Dispense Refill   acyclovir (ZOVIRAX) 200 MG capsule Take 1 capsule 2 x /day as needed for Mouth Ulcers 180 capsule 3   celecoxib (CELEBREX) 100 MG capsule Take  1 capsule Twice Daily as needed with Food for Pain & Inflammation 180 capsule 3   Cholecalciferol (VITAMIN D PO) Take 5,000 Units by mouth daily.     ezetimibe (ZETIA) 10 MG tablet Take  1 tablet  Daily  for Cholesterol 90 tablet 3   Glycerin-Polysorbate 80 (REFRESH DRY EYE THERAPY OP) Apply 1 drop to eye daily.     hydrOXYzine (ATARAX) 50 MG tablet TAKE ONE-HALF TO ONE (0.5-1) TABLET BY MOUTH AT NIGHT FOR INSOMNIA 60 tablet 2   pantoprazole (PROTONIX) 40 MG tablet Take  1 tablet  Daily  to Prevent Heartburn and Indigestion 90 tablet 3   sucralfate (CARAFATE) 1 g tablet DISSOLVE 1 TABLET IN 2-3 OZ OF WATER AND DRINK 30 MINUTES BEFORE EACH MEAL AND DINNER(4 TIMES DAILY) 360 tablet 0   vitamin C (ASCORBIC ACID) 500 MG tablet Take 500 mg by mouth 2 (two) times daily.     vitamin E 100 UNIT capsule Take 100 Units by mouth every morning.     Zinc 50 MG TABS Take 1 tablet Daily  0   cyclobenzaprine (FLEXERIL) 10 MG tablet TAKE 1/2 TO 1 TABLET  3 x /day  AS NEEDED FOR MUSCLE SPASM                                /                                                                   TAKE                                         BY                                                 MOUTH (Patient not taking: Reported on 05/07/2023) 90 tablet 0   gabapentin (NEURONTIN) 100 MG capsule  (Patient not taking: Reported  on 05/07/2023)     No current facility-administered medications on file prior to visit.    Health Maintenance:   Immunization History  Administered Date(s) Administered   PFIZER(Purple  Top)SARS-COV-2 Vaccination 09/21/2019, 10/19/2019, 06/29/2020   Tdap 08/16/2012   Tetanus: 2014 Pneumovax: Declined Zostavax: Declined Influenza declines Covid 19: 2/2, 2021, pfizer, booster 06/2020  MGM: gets through OBGYN has planned UTD 02/2023. DEXA: 2013, gets at [facility], 02/2023 cologuard 03/04/2016 + Colonoscopy 06/12/2017, normal, Dr. Ewing Schlein, 10 years Sleep study 04/2014  Last Eye Exam:  Dr. Hyacinth Meeker, last 2024, glasses Last dental exam: Dr. Hampton Abbot, last 2024, goes q2m  Patient Care Team: Lucky Cowboy, MD as PCP - General (Internal Medicine) Antonietta Barcelona, OD as Referring Physician (Optometry) Vida Rigger, MD as Consulting Physician (Gastroenterology) Gerald Leitz, MD as Consulting Physician (Obstetrics and Gynecology) Marcene Corning, MD as Consulting Physician (Orthopedic Surgery) Jodi Geralds, MD as Consulting Physician (Orthopedic Surgery)  Medical History:  Past Medical History:  Diagnosis Date   Arthritis    Hyperlipidemia    Labile hypertension    Allergies No Known Allergies  SURGICAL HISTORY She  has a past surgical history that includes Neck surgery and Reduction mammaplasty (Bilateral, 2007). FAMILY HISTORY Her family history includes Diabetes in her mother and another family member; Heart attack in her father and mother; Multiple sclerosis in an other family member. SOCIAL HISTORY She  reports that she has never smoked. She has never used smokeless tobacco. She reports that she does not drink alcohol and does not use drugs.  Review of Systems: Review of Systems  Constitutional:  Negative for chills, fever and malaise/fatigue.  HENT:  Negative for congestion, ear pain and sore throat.   Eyes: Negative.   Respiratory:  Negative for cough, shortness of breath  and wheezing.   Cardiovascular:  Negative for chest pain, palpitations and leg swelling.  Gastrointestinal:  Positive for heartburn. Negative for abdominal pain, blood in stool, constipation, diarrhea, melena, nausea and vomiting.  Genitourinary: Negative.   Musculoskeletal:  Positive for joint pain (bil knees, L worse). Negative for back pain.  Skin: Negative.   Neurological:  Negative for dizziness, sensory change, loss of consciousness and headaches.  Psychiatric/Behavioral:  Negative for depression. The patient is not nervous/anxious and does not have insomnia.     Physical Exam: Estimated body mass index is 31.17 kg/m as calculated from the following:   Height as of this encounter: 5\' 4"  (1.626 m).   Weight as of this encounter: 181 lb 9.6 oz (82.4 kg). BP 122/72   Pulse 87   Temp 98 F (36.7 C)   Ht 5\' 4"  (1.626 m)   Wt 181 lb 9.6 oz (82.4 kg)   SpO2 99%   BMI 31.17 kg/m   General Appearance: Well nourished well developed, in no apparent distress.  Eyes: PERRLA, EOMs, conjunctiva no swelling or erythema ENT/Mouth: Ear canals bil obstructed by soft wax.  Oropharynx moist and clear with no exudate, erythema, or swelling.   Neck: Supple, thyroid normal. No bruits.  No cervical adenopathy Respiratory: Respiratory effort normal, Breath sounds clear A&P without wheeze, rhonchi, rales.   Cardio: RRR without murmurs, rubs or gallops. Brisk peripheral pulses without edema.  Chest: non-tender, symmetric, with normal excursions Abdomen: Soft, non tender no guarding,  hernias, masses, or organomegaly.  Lymphatics: Non tender without lymphadenopathy.  Musculoskeletal: Full ROM all peripheral extremities, 5/5 strength, and normal gait. No effusions or obvious deformity.  Skin: Warm, dry without rashes, lesions, ecchymosis. Neuro: Awake and oriented X 3, Cranial nerves intact, reflexes equal bilaterally. Normal muscle tone, no cerebellar symptoms. Sensation intact.  Psych:  normal affect,  Insight and Judgment appropriate.   EKG:  Atrial  rhythm no ST changes  Antuane Eastridge 4:29 PM [facility]

## 2023-05-07 NOTE — Patient Instructions (Signed)
Osteoporosis  Osteoporosis is when the bones get thin and weak. This can cause your bones to break (fracture) more easily. What are the causes? The exact cause of this condition is not known. What increases the risk? Having family members with this condition. Not eating enough healthy foods. Taking certain medicines. Being female. Being age 72 or older. Smoking or using other products that contain nicotine or tobacco, such as e-cigarettes or chewing tobacco. Not exercising. Being of European or Asian ancestry. Having a small body frame. What are the signs or symptoms? A broken bone might be the first sign, especially if the break results from a fall or injury that usually would not cause a bone to break. Other signs and symptoms include: Pain in the neck or low back. Being hunched over (stooped posture). Getting shorter. How is this treated? Eating more foods with more calcium and vitamin D in them. Doing exercises. Stopping tobacco use. Limiting how much alcohol you drink. Taking medicines to slow bone loss or help make the bones stronger. Taking supplements of calcium and vitamin D every day. Taking medicines to replace chemicals in the body (hormone replacement medicines). Monitoring your levels of calcium and vitamin D. The goal of treatment is to strengthen your bones and lower your risk for a bone break. Follow these instructions at home: Eating and drinking Eat plenty of calcium and vitamin D. These nutrients are good for your bones. Good sources of calcium and vitamin D include: Some fish, such as salmon and tuna. Foods that have calcium and vitamin D added to them (fortified foods), such as some breakfast cereals. Egg yolks. Cheese. Liver.  Activity Do exercises as told by your doctor. Ask your doctor what exercises are safe for you. You should do: Exercises that make your muscles work to hold your body weight up (weight-bearing exercises). These include tai chi,  yoga, and walking. Exercises to make your muscles stronger. One example is lifting weights. Lifestyle Do not drink alcohol if: Your doctor tells you not to drink. You are pregnant, may be pregnant, or are planning to become pregnant. If you drink alcohol: Limit how much you use to: 0-1 drink a day for women. 0-2 drinks a day for men. Know how much alcohol is in your drink. In the [place] one drink equals one 12 oz bottle of beer (355 mL), one 5 oz glass of wine (148 mL), or one 1 oz glass of hard liquor (44 mL). Do not smoke or use any products that contain nicotine or tobacco. If you need help quitting, ask your doctor. Preventing falls Use tools to help you move around (mobility aids) as needed. These include canes, walkers, scooters, and crutches. Keep rooms well-lit. Put away things on the floor that could make you trip. These include cords and rugs. Install safety rails on stairs. Install grab bars in bathrooms. Use rubber mats in slippery areas, like bathrooms. Wear shoes that: Fit you well. Support your feet. Have closed toes. Have rubber soles or low heels. Tell your doctor about all of the medicines you are taking. Some medicines can make you more likely to fall. General instructions Take over-the-counter and prescription medicines only as told by your doctor. Keep all follow-up visits. Contact a doctor if: You have not been tested (screened) for osteoporosis and you are: A woman who is age 65 or older. A man who is age 70 or older. Get help right away if: You fall. You get hurt. Summary Osteoporosis happens when your   bones get thin and weak. Weak bones can break (fracture) more easily. Eat plenty of calcium and vitamin D. These are good for your bones. Tell your doctor about all of the medicines that you take. This information is not intended to replace advice given to you by your health care provider. Make sure you discuss any questions you have with your health care  provider. Document Revised: 12/15/2019 Document Reviewed: 12/15/2019 Elsevier Patient Education  2024 [employer]  

## 2023-05-08 ENCOUNTER — Emergency Department (HOSPITAL_COMMUNITY)
Admission: EM | Admit: 2023-05-08 | Discharge: 2023-05-09 | Discharge status: Against Medical Advice | Payer: Medicare Other | Attending: Emergency Medicine | Admitting: Emergency Medicine

## 2023-05-08 ENCOUNTER — Encounter (HOSPITAL_COMMUNITY): Payer: Self-pay

## 2023-05-08 ENCOUNTER — Emergency Department (HOSPITAL_COMMUNITY): Payer: Medicare Other

## 2023-05-08 ENCOUNTER — Other Ambulatory Visit: Payer: Self-pay

## 2023-05-08 DIAGNOSIS — M25512 Pain in left shoulder: Secondary | ICD-10-CM | POA: Diagnosis not present

## 2023-05-08 DIAGNOSIS — S0990XA Unspecified injury of head, initial encounter: Secondary | ICD-10-CM | POA: Insufficient documentation

## 2023-05-08 DIAGNOSIS — W19XXXA Unspecified fall, initial encounter: Secondary | ICD-10-CM | POA: Diagnosis not present

## 2023-05-08 DIAGNOSIS — M25552 Pain in left hip: Secondary | ICD-10-CM | POA: Insufficient documentation

## 2023-05-08 DIAGNOSIS — W0110XA Fall on same level from slipping, tripping and stumbling with subsequent striking against unspecified object, initial encounter: Secondary | ICD-10-CM | POA: Insufficient documentation

## 2023-05-08 DIAGNOSIS — R609 Edema, unspecified: Secondary | ICD-10-CM | POA: Diagnosis not present

## 2023-05-08 DIAGNOSIS — M19012 Primary osteoarthritis, left shoulder: Secondary | ICD-10-CM | POA: Diagnosis not present

## 2023-05-08 DIAGNOSIS — I10 Essential (primary) hypertension: Secondary | ICD-10-CM | POA: Diagnosis not present

## 2023-05-08 DIAGNOSIS — R58 Hemorrhage, not elsewhere classified: Secondary | ICD-10-CM | POA: Diagnosis not present

## 2023-05-08 LAB — CBC WITH DIFFERENTIAL/PLATELET
Absolute Lymphocytes: 1825 {cells}/uL (ref 850–3900)
Absolute Monocytes: 310 {cells}/uL (ref 200–950)
Basophils Absolute: 40 {cells}/uL (ref 0–200)
Basophils Relative: 0.8 %
Eosinophils Absolute: 270 {cells}/uL (ref 15–500)
Eosinophils Relative: 5.4 %
HCT: 39.9 % (ref 35.0–45.0)
Hemoglobin: 12.8 g/dL (ref 11.7–15.5)
MCH: 30.8 pg (ref 27.0–33.0)
MCHC: 32.1 g/dL (ref 32.0–36.0)
MCV: 96.1 fL (ref 80.0–100.0)
MPV: 9.7 fL (ref 7.5–12.5)
Monocytes Relative: 6.2 %
Neutro Abs: 2555 {cells}/uL (ref 1500–7800)
Neutrophils Relative %: 51.1 %
Platelets: 279 10*3/uL (ref 140–400)
RBC: 4.15 10*6/uL (ref 3.80–5.10)
RDW: 12.5 % (ref 11.0–15.0)
Total Lymphocyte: 36.5 %
WBC: 5 10*3/uL (ref 3.8–10.8)

## 2023-05-08 LAB — INSULIN, RANDOM: Insulin: 11.8 u[IU]/mL

## 2023-05-08 LAB — LIPID PANEL
Cholesterol: 206 mg/dL — ABNORMAL HIGH (ref <200)
HDL: 51 mg/dL (ref >=50)
LDL Cholesterol (Calc): 129 mg/dL — ABNORMAL HIGH
Non-HDL Cholesterol (Calc): 155 mg/dL — ABNORMAL HIGH (ref <130)
Total CHOL/HDL Ratio: 4 (calc) (ref <5.0)
Triglycerides: 138 mg/dL (ref <150)

## 2023-05-08 LAB — COMPLETE METABOLIC PANEL WITH GFR
AG Ratio: 1.5 (calc) (ref 1.0–2.5)
ALT: 13 U/L (ref 6–29)
AST: 19 U/L (ref 10–35)
Albumin: 4 g/dL (ref 3.6–5.1)
Alkaline phosphatase (APISO): 64 U/L (ref 37–153)
BUN: 22 mg/dL (ref 7–25)
CO2: 29 mmol/L (ref 20–32)
Calcium: 9.3 mg/dL (ref 8.6–10.4)
Chloride: 106 mmol/L (ref 98–110)
Creat: 0.8 mg/dL (ref 0.60–1.00)
Globulin: 2.7 g/dL (ref 1.9–3.7)
Glucose, Bld: 101 mg/dL — ABNORMAL HIGH (ref 65–99)
Potassium: 4.5 mmol/L (ref 3.5–5.3)
Sodium: 141 mmol/L (ref 135–146)
Total Bilirubin: 0.5 mg/dL (ref 0.2–1.2)
Total Protein: 6.7 g/dL (ref 6.1–8.1)
eGFR: 78 mL/min/{1.73_m2} (ref >=60)

## 2023-05-08 LAB — URIC ACID: Uric Acid, Serum: 4.2 mg/dL (ref 2.5–7.0)

## 2023-05-08 LAB — URINALYSIS, ROUTINE W REFLEX MICROSCOPIC
Bilirubin Urine: NEGATIVE
Glucose, UA: NEGATIVE
Hgb urine dipstick: NEGATIVE
Ketones, ur: NEGATIVE
Leukocytes,Ua: NEGATIVE
Nitrite: NEGATIVE
Protein, ur: NEGATIVE
Specific Gravity, Urine: 1.024 (ref 1.001–1.035)
pH: 6.5 (ref 5.0–8.0)

## 2023-05-08 LAB — HEMOGLOBIN A1C
Hgb A1c MFr Bld: 6.3 %{Hb} — ABNORMAL HIGH (ref <5.7)
Mean Plasma Glucose: 134 mg/dL
eAG (mmol/L): 7.4 mmol/L

## 2023-05-08 LAB — MAGNESIUM: Magnesium: 2.2 mg/dL (ref 1.5–2.5)

## 2023-05-08 LAB — TSH: TSH: 0.19 m[IU]/L — ABNORMAL LOW (ref 0.40–4.50)

## 2023-05-08 LAB — MICROALBUMIN / CREATININE URINE RATIO
Creatinine, Urine: 111 mg/dL (ref 20–275)
Microalb, Ur: <0.2 mg/dL

## 2023-05-08 LAB — VITAMIN D 25 HYDROXY (VIT D DEFICIENCY, FRACTURES): Vit D, 25-Hydroxy: 59 ng/mL (ref 30–100)

## 2023-05-08 NOTE — ED Notes (Signed)
Called pt to update vitals in lobby, no answer.

## 2023-05-08 NOTE — ED Triage Notes (Signed)
Patient reports a bee flew in front of her face causing her to fall backwards. Patient landed on left side. C/o left shoulder and hip pain. Reports she hit her head. Denies LOC and not on blood thinners. Patient is Neuro intact at time of triage.

## 2023-05-10 ENCOUNTER — Other Ambulatory Visit: Payer: Self-pay | Admitting: Internal Medicine

## 2023-05-10 MED ORDER — CYCLOBENZAPRINE HCL 5 MG PO TABS: ORAL_TABLET | ORAL | 0 refills | Status: DC

## 2023-05-13 ENCOUNTER — Other Ambulatory Visit: Payer: Self-pay | Admitting: Internal Medicine

## 2023-06-17 ENCOUNTER — Other Ambulatory Visit: Payer: Self-pay | Admitting: Internal Medicine

## 2023-06-17 DIAGNOSIS — E782 Mixed hyperlipidemia: Secondary | ICD-10-CM

## 2023-06-17 MED ORDER — EZETIMIBE 10 MG PO TABS: ORAL_TABLET | ORAL | 3 refills | Status: DC

## 2023-06-24 ENCOUNTER — Telehealth: Payer: Self-pay | Admitting: Nurse Practitioner

## 2023-06-24 NOTE — Telephone Encounter (Signed)
Patient was inquiring about last lab results for Gout. Uric acid levels were normal. Patient is going to see Ortho in a couple of weeks for the pain in her foot. Advised her to call their office to get on the cancellation list and in the meantime, if getting worse, to call our office to be seen.

## 2023-06-24 NOTE — Telephone Encounter (Signed)
Patient called asking if she has gout?

## 2023-07-03 ENCOUNTER — Telehealth: Payer: Self-pay | Admitting: Internal Medicine

## 2023-07-03 ENCOUNTER — Ambulatory Visit
Admission: EM | Admit: 2023-07-03 | Discharge: 2023-07-03 | Disposition: A | Discharge status: Home / Self Care | Payer: Medicare Other | Attending: Physician Assistant | Admitting: Physician Assistant

## 2023-07-03 DIAGNOSIS — J40 Bronchitis, not specified as acute or chronic: Secondary | ICD-10-CM | POA: Diagnosis not present

## 2023-07-03 DIAGNOSIS — Z1152 Encounter for screening for COVID-19: Secondary | ICD-10-CM | POA: Diagnosis not present

## 2023-07-03 DIAGNOSIS — J329 Chronic sinusitis, unspecified: Secondary | ICD-10-CM | POA: Diagnosis not present

## 2023-07-03 MED ORDER — AMOXICILLIN-POT CLAVULANATE 875-125 MG PO TABS: 1.0000 | ORAL_TABLET | Freq: Two times a day (BID) | ORAL | 0 refills | Status: DC

## 2023-07-03 MED ORDER — PREDNISONE 20 MG PO TABS: 40.0000 mg | ORAL_TABLET | Freq: Every day | ORAL | 0 refills | Status: AC

## 2023-07-03 NOTE — ED Triage Notes (Signed)
"  Starting yesterday morning with fatigue, then sore throat with cough that evening, last night Cough progressing to ?wheeze with a lot of chest congestion". "I had this recently too and it resolved after 2 wks but now back again". No fever.

## 2023-07-03 NOTE — Telephone Encounter (Signed)
Patient asked if Dr.McKeown would send in a medication for a cough and runny nose. I told her we cannot send in anything without the patient being seen. We did not have any appointments so she said she is going to an Urgent care.

## 2023-07-04 ENCOUNTER — Ambulatory Visit: Payer: Self-pay

## 2023-07-04 LAB — SARS CORONAVIRUS 2 (TAT 6-24 HRS): SARS Coronavirus 2: NEGATIVE

## 2023-07-06 DIAGNOSIS — M79671 Pain in right foot: Secondary | ICD-10-CM | POA: Diagnosis not present

## 2023-07-09 ENCOUNTER — Other Ambulatory Visit: Payer: Self-pay

## 2023-07-09 DIAGNOSIS — K219 Gastro-esophageal reflux disease without esophagitis: Secondary | ICD-10-CM

## 2023-07-09 MED ORDER — PANTOPRAZOLE SODIUM 40 MG PO TBEC: DELAYED_RELEASE_TABLET | ORAL | 3 refills | Status: DC

## 2023-07-13 ENCOUNTER — Ambulatory Visit: Payer: Medicare Other | Admitting: Nurse Practitioner

## 2023-08-06 ENCOUNTER — Other Ambulatory Visit: Payer: Self-pay | Admitting: Nurse Practitioner

## 2023-08-06 DIAGNOSIS — Z79899 Other long term (current) drug therapy: Secondary | ICD-10-CM

## 2023-08-06 DIAGNOSIS — M25531 Pain in right wrist: Secondary | ICD-10-CM

## 2023-08-07 ENCOUNTER — Other Ambulatory Visit: Payer: Self-pay | Admitting: Internal Medicine

## 2023-08-11 ENCOUNTER — Other Ambulatory Visit: Payer: Self-pay

## 2023-08-11 DIAGNOSIS — Z9189 Other specified personal risk factors, not elsewhere classified: Secondary | ICD-10-CM | POA: Diagnosis not present

## 2023-08-11 DIAGNOSIS — D239 Other benign neoplasm of skin, unspecified: Secondary | ICD-10-CM | POA: Diagnosis not present

## 2023-08-11 DIAGNOSIS — Z7251 High risk heterosexual behavior: Secondary | ICD-10-CM | POA: Diagnosis not present

## 2023-08-11 DIAGNOSIS — M810 Age-related osteoporosis without current pathological fracture: Secondary | ICD-10-CM | POA: Diagnosis not present

## 2023-08-11 MED ORDER — HYDROXYZINE HCL 50 MG PO TABS: ORAL_TABLET | ORAL | 2 refills | Status: DC

## 2023-08-13 ENCOUNTER — Ambulatory Visit: Payer: Medicare Other | Admitting: Nurse Practitioner

## 2023-08-17 ENCOUNTER — Telehealth: Payer: Self-pay | Admitting: Nurse Practitioner

## 2023-08-17 NOTE — Telephone Encounter (Signed)
Patient called about ab results that are supposed to determine if she has gout or not? If you could call her back with information.

## 2023-08-21 DIAGNOSIS — M79671 Pain in right foot: Secondary | ICD-10-CM | POA: Diagnosis not present

## 2023-08-29 DIAGNOSIS — M25571 Pain in right ankle and joints of right foot: Secondary | ICD-10-CM | POA: Diagnosis not present

## 2023-09-14 DIAGNOSIS — M25571 Pain in right ankle and joints of right foot: Secondary | ICD-10-CM | POA: Diagnosis not present

## 2023-09-21 ENCOUNTER — Other Ambulatory Visit: Payer: Self-pay

## 2023-09-21 DIAGNOSIS — Z79899 Other long term (current) drug therapy: Secondary | ICD-10-CM

## 2023-09-21 DIAGNOSIS — M25531 Pain in right wrist: Secondary | ICD-10-CM

## 2023-09-21 MED ORDER — CELECOXIB 100 MG PO CAPS: ORAL_CAPSULE | ORAL | 3 refills | Status: DC

## 2023-11-17 DIAGNOSIS — R3989 Other symptoms and signs involving the genitourinary system: Secondary | ICD-10-CM | POA: Diagnosis not present

## 2023-11-17 DIAGNOSIS — R32 Unspecified urinary incontinence: Secondary | ICD-10-CM | POA: Diagnosis not present

## 2023-11-17 DIAGNOSIS — D229 Melanocytic nevi, unspecified: Secondary | ICD-10-CM | POA: Diagnosis not present

## 2023-11-17 DIAGNOSIS — R350 Frequency of micturition: Secondary | ICD-10-CM | POA: Diagnosis not present

## 2023-11-17 DIAGNOSIS — G4700 Insomnia, unspecified: Secondary | ICD-10-CM | POA: Diagnosis not present

## 2024-01-01 ENCOUNTER — Ambulatory Visit (HOSPITAL_COMMUNITY): Admission: EM | Admit: 2024-01-01 | Discharge: 2024-01-01 | Disposition: A | Discharge status: Home / Self Care

## 2024-01-01 ENCOUNTER — Encounter (HOSPITAL_COMMUNITY): Payer: Self-pay

## 2024-01-01 ENCOUNTER — Telehealth (HOSPITAL_COMMUNITY): Payer: Self-pay

## 2024-01-01 DIAGNOSIS — M25571 Pain in right ankle and joints of right foot: Secondary | ICD-10-CM | POA: Diagnosis not present

## 2024-01-01 DIAGNOSIS — Z760 Encounter for issue of repeat prescription: Secondary | ICD-10-CM

## 2024-01-01 DIAGNOSIS — Z79899 Other long term (current) drug therapy: Secondary | ICD-10-CM

## 2024-01-01 DIAGNOSIS — E782 Mixed hyperlipidemia: Secondary | ICD-10-CM

## 2024-01-01 DIAGNOSIS — K219 Gastro-esophageal reflux disease without esophagitis: Secondary | ICD-10-CM

## 2024-01-01 DIAGNOSIS — M25531 Pain in right wrist: Secondary | ICD-10-CM

## 2024-01-01 DIAGNOSIS — M79675 Pain in left toe(s): Secondary | ICD-10-CM | POA: Diagnosis not present

## 2024-01-01 MED ORDER — CELECOXIB 100 MG PO CAPS: ORAL_CAPSULE | ORAL | 1 refills | Status: AC

## 2024-01-01 MED ORDER — PANTOPRAZOLE SODIUM 40 MG PO TBEC: DELAYED_RELEASE_TABLET | ORAL | 1 refills | Status: AC

## 2024-01-01 MED ORDER — EZETIMIBE 10 MG PO TABS: ORAL_TABLET | ORAL | 1 refills | Status: DC

## 2024-01-01 NOTE — Discharge Instructions (Signed)
  1. Medication refill (Primary) - celecoxib  (CELEBREX ) 100 MG capsule; TAKE 1 CAPSULE BY MOUTH TWICE DAILY WITH FOOD AS NEEDED FOR PAIN AND INFLAMMATION  Dispense: 60 capsule; Refill: 1 - ezetimibe  (ZETIA ) 10 MG tablet; Take  1 tablet  Daily  for Cholesterol  Dispense: 30 tablet; Refill: 1 - pantoprazole  (PROTONIX ) 40 MG tablet; Take  1 tablet  Daily  to Prevent Heartburn and Indigestion  Dispense: 30 tablet; Refill: 1 -Continue to monitor symptoms for any change in severity if there is any escalation of current symptoms or development of new symptoms follow-up in urgent care or ER for further evaluation and management.

## 2024-01-01 NOTE — Telephone Encounter (Signed)
 Attempted to contact the patient to schedule VAS [place]. No answer. Left message. First Attempt Provided direct contact number for scheduling: [phone].

## 2024-01-01 NOTE — ED Provider Notes (Signed)
 [facility]  Note:  This document was prepared using Dragon voice recognition software and may include unintentional dictation errors.  MRN: [id number] DOB: 07-Dec-1950  Subjective:   Susan Carr is a 73 y.o. female presenting for refill of prescribed medications for arthritic pain, GERD and high cholesterol.  Patient reports that she has been out of medications for approximately a week.  Patient states that her primary care provider passed away and she has not been able to see her new doctor and has an appointment on June 28 for follow-up.  Patient is here hoping that she can get refill of medication until she can see her primary care doctor.  Patient denies any current symptoms, no chest pain, shortness of breath, weakness, dizziness.  Patient is having some foot pain after seeing her orthopedist earlier today and having an injection but denies any other symptoms.  No current facility-administered medications for this encounter.  Current Outpatient Medications:    acyclovir  (ZOVIRAX ) 200 MG capsule, Take 1 capsule 2 x /day as needed for Mouth Ulcers, Disp: 180 capsule, Rfl: 3   Cholecalciferol (VITAMIN D  PO), Take 5,000 Units by mouth daily., Disp: , Rfl:    cyclobenzaprine  (FLEXERIL ) 5 MG tablet, TAKE 1/2 TO 1 TABLET BY MOUTH THREE TIMES DAILY AS NEEDED FOR MUSCLE SPASM, Disp: 90 tablet, Rfl: 0   Glycerin-Polysorbate 80 (REFRESH DRY EYE THERAPY OP), Apply 1 drop to eye daily., Disp: , Rfl:    hydrOXYzine  (ATARAX ) 50 MG tablet, TAKE ONE-HALF TO ONE (0.5-1) TABLET BY MOUTH AT NIGHT FOR INSOMNIA, Disp: 60 tablet, Rfl: 2   sucralfate  (CARAFATE ) 1 g tablet, DISSOLVE 1 TABLET IN 2-3 OZ OF WATER AND DRINK 30 MINUTES BEFORE EACH MEAL AND DINNER(4 TIMES DAILY), Disp: 360 tablet, Rfl: 0   vitamin C (ASCORBIC ACID) 500 MG tablet, Take 500 mg by mouth 2 (two) times daily., Disp: , Rfl:    vitamin E 100 UNIT capsule, Take 100 Units by mouth every morning., Disp: , Rfl:    Zinc  50 MG  TABS, Take 1 tablet Daily, Disp: , Rfl: 0   celecoxib  (CELEBREX ) 100 MG capsule, TAKE 1 CAPSULE BY MOUTH TWICE DAILY WITH FOOD AS NEEDED FOR PAIN AND INFLAMMATION, Disp: 60 capsule, Rfl: 1   ezetimibe  (ZETIA ) 10 MG tablet, Take  1 tablet  Daily  for Cholesterol, Disp: 30 tablet, Rfl: 1   pantoprazole  (PROTONIX ) 40 MG tablet, Take  1 tablet  Daily  to Prevent Heartburn and Indigestion, Disp: 30 tablet, Rfl: 1   No Known Allergies  Past Medical History:  Diagnosis Date   Arthritis    Hyperlipidemia    Labile hypertension      Past Surgical History:  Procedure Laterality Date   NECK SURGERY     REDUCTION MAMMAPLASTY Bilateral 2007    Family History  Problem Relation Age of Onset   Heart attack Mother    Diabetes Mother    Heart attack Father    Diabetes Other    Multiple sclerosis Other     Social History   Tobacco Use   Smoking status: Never   Smokeless tobacco: Never  Vaping Use   Vaping status: Never Used  Substance Use Topics   Alcohol use: No   Drug use: No    ROS Refer to HPI for ROS details.  Objective:   Vitals: BP 120/74 (BP Location: Left Arm)   Pulse 83   Temp 98.4 F (36.9 C) (Oral)   Resp 16   Ht  5' 4 (1.626 m)   Wt 180 lb (81.6 kg)   SpO2 93%   BMI 30.90 kg/m   Physical Exam Vitals and nursing note reviewed.  Constitutional:      General: She is not in acute distress.    Appearance: Normal appearance. She is well-developed. She is not ill-appearing or toxic-appearing.  HENT:     Head: Normocephalic and atraumatic.   Cardiovascular:     Rate and Rhythm: Normal rate and regular rhythm.     Heart sounds: Normal heart sounds. No murmur heard. Pulmonary:     Effort: Pulmonary effort is normal. No respiratory distress.     Breath sounds: Normal breath sounds. No stridor. No wheezing, rhonchi or rales.  Chest:     Chest wall: No tenderness.   Skin:    General: Skin is warm and dry.   Neurological:     General: No focal deficit  present.     Mental Status: She is alert and oriented to person, place, and time.   Psychiatric:        Mood and Affect: Mood normal.        Behavior: Behavior normal.     Procedures  No results found for this or any previous visit (from the past 24 hours).  No results found.   Assessment and Plan :     Discharge Instructions       1. Medication refill (Primary) - celecoxib  (CELEBREX ) 100 MG capsule; TAKE 1 CAPSULE BY MOUTH TWICE DAILY WITH FOOD AS NEEDED FOR PAIN AND INFLAMMATION  Dispense: 60 capsule; Refill: 1 - ezetimibe  (ZETIA ) 10 MG tablet; Take  1 tablet  Daily  for Cholesterol  Dispense: 30 tablet; Refill: 1 - pantoprazole  (PROTONIX ) 40 MG tablet; Take  1 tablet  Daily  to Prevent Heartburn and Indigestion  Dispense: 30 tablet; Refill: 1 -Continue to monitor symptoms for any change in severity if there is any escalation of current symptoms or development of new symptoms follow-up in urgent care or ER for further evaluation and management.      [name]   [name], [name], [name] 01/01/24 [phone]

## 2024-01-01 NOTE — ED Triage Notes (Signed)
 Patient presenting with need for medication refills onset 1 week ago. Patient states her primary care provider passed away. Scheduled to be seen by new provider June, 28th.   Needing Zetia , Celebrex , and Protonix  refilled.

## 2024-01-22 ENCOUNTER — Encounter: Payer: Self-pay | Admitting: Internal Medicine

## 2024-02-08 ENCOUNTER — Ambulatory Visit (INDEPENDENT_AMBULATORY_CARE_PROVIDER_SITE_OTHER): Admitting: Family Medicine

## 2024-02-08 ENCOUNTER — Encounter: Payer: Self-pay | Admitting: Family Medicine

## 2024-02-08 VITALS — BP 116/70 | HR 81 | Ht 64.0 in | Wt 183.4 lb

## 2024-02-08 DIAGNOSIS — K219 Gastro-esophageal reflux disease without esophagitis: Secondary | ICD-10-CM

## 2024-02-08 DIAGNOSIS — E782 Mixed hyperlipidemia: Secondary | ICD-10-CM

## 2024-02-08 DIAGNOSIS — I10 Essential (primary) hypertension: Secondary | ICD-10-CM

## 2024-02-08 DIAGNOSIS — Z7689 Persons encountering health services in other specified circumstances: Secondary | ICD-10-CM | POA: Diagnosis not present

## 2024-02-08 MED ORDER — HYDROXYZINE HCL 50 MG PO TABS: ORAL_TABLET | ORAL | 2 refills | Status: DC

## 2024-02-08 NOTE — Progress Notes (Unsigned)
 New Patient Office Visit  Subjective    Patient ID: Susan Carr, female    DOB: 1951/02/14  Age: 73 y.o. MRN: [id number]  CC:  Chief Complaint  Patient presents with   Establish Care    Needs refill on her hydroxyzine   Would like cholesterol checked     HPI [name] presents to establish care ***  Outpatient Encounter Medications as of 02/08/2024  Medication Sig   acyclovir  (ZOVIRAX ) 200 MG capsule Take 1 capsule 2 x /day as needed for Mouth Ulcers   celecoxib  (CELEBREX ) 100 MG capsule TAKE 1 CAPSULE BY MOUTH TWICE DAILY WITH FOOD AS NEEDED FOR PAIN AND INFLAMMATION   Cholecalciferol (VITAMIN D  PO) Take 5,000 Units by mouth daily.   cyclobenzaprine  (FLEXERIL ) 5 MG tablet TAKE 1/2 TO 1 TABLET BY MOUTH THREE TIMES DAILY AS NEEDED FOR MUSCLE SPASM   ezetimibe  (ZETIA ) 10 MG tablet Take  1 tablet  Daily  for Cholesterol   Glycerin-Polysorbate 80 (REFRESH DRY EYE THERAPY OP) Apply 1 drop to eye daily.   hydrOXYzine  (ATARAX ) 50 MG tablet TAKE ONE-HALF TO ONE (0.5-1) TABLET BY MOUTH AT NIGHT FOR INSOMNIA   pantoprazole  (PROTONIX ) 40 MG tablet Take  1 tablet  Daily  to Prevent Heartburn and Indigestion   sucralfate  (CARAFATE ) 1 g tablet DISSOLVE 1 TABLET IN 2-3 OZ OF WATER AND DRINK 30 MINUTES BEFORE EACH MEAL AND DINNER(4 TIMES DAILY)   vitamin C (ASCORBIC ACID) 500 MG tablet Take 500 mg by mouth 2 (two) times daily.   vitamin E 100 UNIT capsule Take 100 Units by mouth every morning.   Zinc  50 MG TABS Take 1 tablet Daily   No facility-administered encounter medications on file as of 02/08/2024.    Past Medical History:  Diagnosis Date   Arthritis    Hyperlipidemia    Labile hypertension     Past Surgical History:  Procedure Laterality Date   NECK SURGERY     REDUCTION MAMMAPLASTY Bilateral 2007    Family History  Problem Relation Age of Onset   Heart attack Mother    Diabetes Mother    Heart attack Father    Diabetes Other    Multiple sclerosis  Other     Social History   Socioeconomic History   Marital status: Widowed    Spouse name: Not on file   Number of children: Not on file   Years of education: Not on file   Highest education level: Not on file  Occupational History   Not on file  Tobacco Use   Smoking status: Never   Smokeless tobacco: Never  Vaping Use   Vaping status: Never Used  Substance and Sexual Activity   Alcohol use: No   Drug use: No   Sexual activity: Not Currently  Other Topics Concern   Not on file  Social History Narrative   ** Merged History Encounter **       Social Drivers of Health   Financial Resource Strain: Not on file  Food Insecurity: Not on file  Transportation Needs: Not on file  Physical Activity: Not on file  Stress: Not on file  Social Connections: Not on file  Intimate Partner Violence: Not on file    ROS      Objective   BP 116/70   Pulse 81   Ht 5' 4 (1.626 m)   Wt 183 lb 6.4 oz (83.2 kg)   SpO2 94%   BMI 31.48 kg/m   Physical Exam  {  Labs (Optional):23779}    Assessment & Plan:   There are no diagnoses linked to this encounter.   No follow-ups on file.   Tanda Raguel SQUIBB, MD

## 2024-02-12 ENCOUNTER — Encounter: Payer: Self-pay | Admitting: Family Medicine

## 2024-03-04 ENCOUNTER — Other Ambulatory Visit: Payer: Self-pay | Admitting: Family Medicine

## 2024-03-04 ENCOUNTER — Telehealth: Payer: Self-pay | Admitting: Family Medicine

## 2024-03-04 DIAGNOSIS — Z760 Encounter for issue of repeat prescription: Secondary | ICD-10-CM

## 2024-03-04 NOTE — Telephone Encounter (Signed)
 Pt requesting refill for ezetimibe  (ZETIA ). Last provider who filled in medication has passed and pt is running out of medication. Pt's last visit with pcp was on 07/28 to [facility] care. Pt wants a CB when medication has been sent to pharmacy. Please advise

## 2024-03-07 ENCOUNTER — Other Ambulatory Visit: Payer: Self-pay | Admitting: Family Medicine

## 2024-03-07 DIAGNOSIS — Z760 Encounter for issue of repeat prescription: Secondary | ICD-10-CM

## 2024-03-07 MED ORDER — EZETIMIBE 10 MG PO TABS: ORAL_TABLET | ORAL | 1 refills | Status: DC

## 2024-04-12 ENCOUNTER — Encounter: Payer: Medicare Other | Admitting: Nurse Practitioner

## 2024-04-12 ENCOUNTER — Ambulatory Visit (INDEPENDENT_AMBULATORY_CARE_PROVIDER_SITE_OTHER): Admitting: Family Medicine

## 2024-04-12 ENCOUNTER — Encounter: Payer: Self-pay | Admitting: Family Medicine

## 2024-04-12 VITALS — BP 137/84 | HR 91 | Ht 64.0 in | Wt 182.6 lb

## 2024-04-12 DIAGNOSIS — Z136 Encounter for screening for cardiovascular disorders: Secondary | ICD-10-CM | POA: Diagnosis not present

## 2024-04-12 DIAGNOSIS — Z0000 Encounter for general adult medical examination without abnormal findings: Secondary | ICD-10-CM

## 2024-04-12 DIAGNOSIS — Z760 Encounter for issue of repeat prescription: Secondary | ICD-10-CM | POA: Diagnosis not present

## 2024-04-12 MED ORDER — EZETIMIBE 10 MG PO TABS: ORAL_TABLET | ORAL | 1 refills | Status: AC

## 2024-04-12 NOTE — Progress Notes (Signed)
 Established Patient Office Visit  Subjective    Patient ID: Susan Carr, female    DOB: 21-Jun-1951  Age: 73 y.o. MRN: [id number]  CC:  Chief Complaint  Patient presents with   Annual Exam    HPI ETHAL GOTAY presents for routine annual exam. Patient denies acute complaints.   Outpatient Encounter Medications as of 04/12/2024  Medication Sig   celecoxib  (CELEBREX ) 100 MG capsule TAKE 1 CAPSULE BY MOUTH TWICE DAILY WITH FOOD AS NEEDED FOR PAIN AND INFLAMMATION   Cholecalciferol (VITAMIN D  PO) Take 5,000 Units by mouth daily.   cyclobenzaprine  (FLEXERIL ) 5 MG tablet TAKE 1/2 TO 1 TABLET BY MOUTH THREE TIMES DAILY AS NEEDED FOR MUSCLE SPASM   hydrOXYzine  (ATARAX ) 50 MG tablet TAKE ONE-HALF TO ONE (0.5-1) TABLET BY MOUTH AT NIGHT FOR INSOMNIA   pantoprazole  (PROTONIX ) 40 MG tablet Take  1 tablet  Daily  to Prevent Heartburn and Indigestion   sucralfate  (CARAFATE ) 1 g tablet DISSOLVE 1 TABLET IN 2-3 OZ OF WATER AND DRINK 30 MINUTES BEFORE EACH MEAL AND DINNER(4 TIMES DAILY)   vitamin C (ASCORBIC ACID) 500 MG tablet Take 500 mg by mouth 2 (two) times daily.   vitamin E 100 UNIT capsule Take 100 Units by mouth every morning.   Zinc  50 MG TABS Take 1 tablet Daily   acyclovir  (ZOVIRAX ) 200 MG capsule Take 1 capsule 2 x /day as needed for Mouth Ulcers   ezetimibe  (ZETIA ) 10 MG tablet Take  1 tablet  Daily  for Cholesterol   Glycerin-Polysorbate 80 (REFRESH DRY EYE THERAPY OP) Apply 1 drop to eye daily.   [DISCONTINUED] ezetimibe  (ZETIA ) 10 MG tablet Take  1 tablet  Daily  for Cholesterol   No facility-administered encounter medications on file as of 04/12/2024.    Past Medical History:  Diagnosis Date   Arthritis    Hyperlipidemia    Labile hypertension     Past Surgical History:  Procedure Laterality Date   NECK SURGERY     REDUCTION MAMMAPLASTY Bilateral 2007    Family History  Problem Relation Age of Onset   Heart attack Mother    Diabetes Mother    Heart  attack Father    Diabetes Other    Multiple sclerosis Other     Social History   Socioeconomic History   Marital status: Widowed    Spouse name: Not on file   Number of children: Not on file   Years of education: Not on file   Highest education level: Not on file  Occupational History   Not on file  Tobacco Use   Smoking status: Never   Smokeless tobacco: Never  Vaping Use   Vaping status: Never Used  Substance and Sexual Activity   Alcohol use: No   Drug use: No   Sexual activity: Not Currently  Other Topics Concern   Not on file  Social History Narrative   ** Merged History Encounter **       Social Drivers of Health   Financial Resource Strain: Not on file  Food Insecurity: No Food Insecurity (04/12/2024)   Hunger Vital Sign    Worried About Running Out of Food in the Last Year: Never true    Ran Out of Food in the Last Year: Never true  Transportation Needs: No Transportation Needs (04/12/2024)   PRAPARE - Administrator, Civil Service (Medical): No    Lack of Transportation (Non-Medical): No  Physical Activity: Not on file  Stress: Not on file  Social Connections: Not on file  Intimate Partner Violence: Not At Risk (04/12/2024)   Humiliation, Afraid, Rape, and Kick questionnaire    Fear of Current or Ex-Partner: No    Emotionally Abused: No    Physically Abused: No    Sexually Abused: No    Review of Systems  All other systems reviewed and are negative.       Objective    BP 137/84   Pulse 91   Ht 5' 4 (1.626 m)   Wt 182 lb 9.6 oz (82.8 kg)   SpO2 96%   BMI 31.34 kg/m   Physical Exam Vitals and nursing note reviewed.  Constitutional:      General: She is not in acute distress. HENT:     Head: Normocephalic and atraumatic.     Right Ear: Tympanic membrane, ear canal and external ear normal.     Left Ear: Tympanic membrane, ear canal and external ear normal.     Nose: Nose normal.     Mouth/Throat:     Mouth: Mucous membranes  are moist.     Pharynx: Oropharynx is clear.  Eyes:     Conjunctiva/sclera: Conjunctivae normal.     Pupils: Pupils are equal, round, and reactive to light.  Neck:     Thyroid : No thyromegaly.  Cardiovascular:     Rate and Rhythm: Normal rate and regular rhythm.     Heart sounds: Normal heart sounds. No murmur heard. Pulmonary:     Effort: Pulmonary effort is normal. No respiratory distress.     Breath sounds: Normal breath sounds.  Abdominal:     General: There is no distension.     Palpations: Abdomen is soft. There is no mass.     Tenderness: There is no abdominal tenderness.  Musculoskeletal:        General: Normal range of motion.     Cervical back: Normal range of motion and neck supple.  Skin:    General: Skin is warm and dry.  Neurological:     General: No focal deficit present.     Mental Status: She is alert and oriented to person, place, and time.  Psychiatric:        Mood and Affect: Mood normal.        Behavior: Behavior normal.         Assessment & Plan:   Annual physical exam -     CMP14+EGFR  Screening for deficiency anemia  Encounter for screening for cardiovascular disorders -     Lipid panel  Screening for endocrine/metabolic/immunity disorders  Medication refill -     Ezetimibe ; Take  1 tablet  Daily  for Cholesterol  Dispense: 30 tablet; Refill: 1     No follow-ups on file.   Tanda Raguel SQUIBB, MD

## 2024-04-13 LAB — LIPID PANEL
Chol/HDL Ratio: 4.3 ratio (ref 0.0–4.4)
Cholesterol, Total: 214 mg/dL — ABNORMAL HIGH (ref 100–199)
HDL: 50 mg/dL (ref >39)
LDL Chol Calc (NIH): 142 mg/dL — ABNORMAL HIGH (ref 0–99)
Triglycerides: 121 mg/dL (ref 0–149)
VLDL Cholesterol Cal: 22 mg/dL (ref 5–40)

## 2024-04-13 LAB — CMP14+EGFR
ALT: 19 IU/L (ref 0–32)
AST: 25 IU/L (ref 0–40)
Albumin: 4.5 g/dL (ref 3.8–4.8)
Alkaline Phosphatase: 82 IU/L (ref 49–135)
BUN/Creatinine Ratio: 24 (ref 12–28)
BUN: 20 mg/dL (ref 8–27)
Bilirubin Total: 0.3 mg/dL (ref 0.0–1.2)
CO2: 24 mmol/L (ref 20–29)
Calcium: 9.7 mg/dL (ref 8.7–10.3)
Chloride: 103 mmol/L (ref 96–106)
Creatinine, Ser: 0.85 mg/dL (ref 0.57–1.00)
Globulin, Total: 2.4 g/dL (ref 1.5–4.5)
Glucose: 125 mg/dL — ABNORMAL HIGH (ref 70–99)
Potassium: 4.3 mmol/L (ref 3.5–5.2)
Sodium: 142 mmol/L (ref 134–144)
Total Protein: 6.9 g/dL (ref 6.0–8.5)
eGFR: 72 mL/min/1.73 (ref >59)

## 2024-04-14 ENCOUNTER — Ambulatory Visit: Payer: Self-pay | Admitting: Family Medicine

## 2024-04-25 ENCOUNTER — Encounter: Payer: Medicare Other | Admitting: Nurse Practitioner

## 2024-05-02 ENCOUNTER — Ambulatory Visit: Admission: EM | Admit: 2024-05-02 | Discharge: 2024-05-02 | Disposition: A | Discharge status: Home / Self Care

## 2024-05-02 ENCOUNTER — Ambulatory Visit: Payer: Self-pay

## 2024-05-02 ENCOUNTER — Ambulatory Visit (INDEPENDENT_AMBULATORY_CARE_PROVIDER_SITE_OTHER)

## 2024-05-02 ENCOUNTER — Encounter: Payer: Self-pay | Admitting: Emergency Medicine

## 2024-05-02 DIAGNOSIS — R0602 Shortness of breath: Secondary | ICD-10-CM | POA: Diagnosis not present

## 2024-05-02 DIAGNOSIS — R0989 Other specified symptoms and signs involving the circulatory and respiratory systems: Secondary | ICD-10-CM

## 2024-05-02 DIAGNOSIS — G9331 Postviral fatigue syndrome: Secondary | ICD-10-CM | POA: Diagnosis not present

## 2024-05-02 DIAGNOSIS — R058 Other specified cough: Secondary | ICD-10-CM | POA: Diagnosis not present

## 2024-05-02 MED ORDER — AZELASTINE HCL 0.1 % NA SOLN: 1.0000 | Freq: Two times a day (BID) | NASAL | 1 refills | Status: DC

## 2024-05-02 MED ORDER — PROMETHAZINE-DM 6.25-15 MG/5ML PO SYRP: 10.0000 mL | ORAL_SOLUTION | Freq: Three times a day (TID) | ORAL | 0 refills | Status: DC | PRN

## 2024-05-02 NOTE — ED Triage Notes (Signed)
 Pt reports nasal and chest congestion as well as productive cough (green/yellow sputum) x 9 days. Has been taking mucinex-d and coricidin at home with little relief. No improvement to cough. Reports she is starting to be SOB when she lays down at night due to the congestion. No sick contacts. No at home testing. Denies fevers.

## 2024-05-02 NOTE — Discharge Instructions (Signed)
  1. Postviral syndrome (Primary) - DG Chest 2 View x-ray performed in UC shows no acute cardiopulmonary processes, no sign of consolidation or pneumonia. - azelastine (ASTELIN) 0.1 % nasal spray; Place 1 spray into both nostrils 2 (two) times daily. Use in each nostril as directed  Dispense: 30 mL; Refill: 1 - promethazine -dextromethorphan (PROMETHAZINE -DM) 6.25-15 MG/5ML syrup; Take 10 mLs by mouth 3 (three) times daily as needed.  Dispense: 240 mL; Refill: 0 -Continue to monitor symptoms for any change in severity if there is any escalation of current symptoms or development of new symptoms follow-up in ER for further evaluation and management.

## 2024-05-02 NOTE — ED Provider Notes (Signed)
 [facility]  Note:  This document was prepared using Conservation officer, historic buildings and may include unintentional dictation errors.  MRN: [id number] DOB: [birth year]  Subjective:   Susan Carr is a 73 y.o. female presenting for nasal congestion, chest congestion, productive cough with greenish-yellow sputum x 9 days.  Patient reports she has been taking Coricidin and Mucinex D with minimal improvement.  Patient reports that cough is persistent usually worse at night when laying down.  Patient has crackling/wheezing in the airway and mild dyspnea when laying down.  Patient denies any known sick contacts.  No home testing prior to arrival today.  No known fever, fatigue, weakness, dizziness or chest pain.  No current facility-administered medications for this encounter.  Current Outpatient Medications:    acyclovir  (ZOVIRAX ) 200 MG capsule, Take 1 capsule 2 x /day as needed for Mouth Ulcers, Disp: 180 capsule, Rfl: 3   azelastine (ASTELIN) 0.1 % nasal spray, Place 1 spray into both nostrils 2 (two) times daily. Use in each nostril as directed, Disp: 30 mL, Rfl: 1   celecoxib  (CELEBREX ) 100 MG capsule, TAKE 1 CAPSULE BY MOUTH TWICE DAILY WITH FOOD AS NEEDED FOR PAIN AND INFLAMMATION, Disp: 60 capsule, Rfl: 1   Cholecalciferol (VITAMIN D  PO), Take 5,000 Units by mouth daily., Disp: , Rfl:    ezetimibe  (ZETIA ) 10 MG tablet, Take  1 tablet  Daily  for Cholesterol, Disp: 30 tablet, Rfl: 1   Glycerin-Polysorbate 80 (REFRESH DRY EYE THERAPY OP), Apply 1 drop to eye daily., Disp: , Rfl:    hydrOXYzine  (ATARAX ) 50 MG tablet, TAKE ONE-HALF TO ONE (0.5-1) TABLET BY MOUTH AT NIGHT FOR INSOMNIA, Disp: 60 tablet, Rfl: 2   pantoprazole  (PROTONIX ) 40 MG tablet, Take  1 tablet  Daily  to Prevent Heartburn and Indigestion, Disp: 30 tablet, Rfl: 1   promethazine -dextromethorphan (PROMETHAZINE -DM) 6.25-15 MG/5ML syrup, Take 10 mLs by mouth 3 (three) times daily as needed., Disp: 240 mL, Rfl: 0    sucralfate  (CARAFATE ) 1 g tablet, DISSOLVE 1 TABLET IN 2-3 OZ OF WATER AND DRINK 30 MINUTES BEFORE EACH MEAL AND DINNER(4 TIMES DAILY), Disp: 360 tablet, Rfl: 0   vitamin C (ASCORBIC ACID) 500 MG tablet, Take 500 mg by mouth 2 (two) times daily., Disp: , Rfl:    vitamin E 100 UNIT capsule, Take 100 Units by mouth every morning., Disp: , Rfl:    Zinc  50 MG TABS, Take 1 tablet Daily, Disp: , Rfl: 0   cyclobenzaprine  (FLEXERIL ) 5 MG tablet, TAKE 1/2 TO 1 TABLET BY MOUTH THREE TIMES DAILY AS NEEDED FOR MUSCLE SPASM (Patient not taking: Reported on 05/02/2024), Disp: 90 tablet, Rfl: 0   mirabegron ER (MYRBETRIQ) 25 MG TB24 tablet, 1 tablet Orally Once a day for 30 days (Patient not taking: Reported on 05/02/2024), Disp: , Rfl:    sulfamethoxazole-trimethoprim (BACTRIM DS) 800-160 MG tablet, Take 1 tablet by mouth 2 (two) times daily. (Patient not taking: Reported on 05/02/2024), Disp: , Rfl:    No Known Allergies  Past Medical History:  Diagnosis Date   Arthritis    Hyperlipidemia    Labile hypertension      Past Surgical History:  Procedure Laterality Date   NECK SURGERY     REDUCTION MAMMAPLASTY Bilateral 2007    Family History  Problem Relation Age of Onset   Heart attack Mother    Diabetes Mother    Heart attack Father    Diabetes Other    Multiple sclerosis Other  Social History   Tobacco Use   Smoking status: Never   Smokeless tobacco: Never  Vaping Use   Vaping status: Never Used  Substance Use Topics   Alcohol use: No   Drug use: No    ROS Refer to HPI for ROS details.  Objective:   Vitals: BP 135/85 (BP Location: Left Arm)   Pulse 75   Temp 98.1 F (36.7 C) (Oral)   Resp 20   SpO2 94%   Physical Exam Vitals and nursing note reviewed.  Constitutional:      General: She is not in acute distress.    Appearance: She is well-developed. She is not ill-appearing or toxic-appearing.  HENT:     Head: Normocephalic and atraumatic.     Nose: Congestion  present.     Mouth/Throat:     Mouth: Mucous membranes are moist.     Pharynx: Oropharynx is clear. No posterior oropharyngeal erythema.  Cardiovascular:     Rate and Rhythm: Normal rate.  Pulmonary:     Effort: Pulmonary effort is normal. No respiratory distress.     Breath sounds: No stridor. No wheezing.  Chest:     Chest wall: No tenderness.  Skin:    General: Skin is warm and dry.  Neurological:     General: No focal deficit present.     Mental Status: She is alert and oriented to person, place, and time.  Psychiatric:        Mood and Affect: Mood normal.        Behavior: Behavior normal.     Procedures  No results found for this or any previous visit (from the past 24 hours).  DG Chest 2 View Result Date: 05/02/2024 EXAM: 2 VIEW(S) XRAY OF THE CHEST 05/02/2024 07:36:08 PM COMPARISON: 8 / 20 / 23 CLINICAL HISTORY: Chest congestion. Table formatting from the original note was not included. Pt reports nasal and chest congestion as well as productive cough (green/yellow sputum) x 9 days. Has been taking mucinex-d and coricidin at home with little relief. No improvement to cough. Reports she is starting to be SOB when she lays down at night due to the congestion. FINDINGS: LUNGS AND PLEURA: No focal pulmonary opacity. No pulmonary edema. No pleural effusion. No pneumothorax. HEART AND MEDIASTINUM: No acute abnormality of the cardiac and mediastinal silhouettes. BONES AND SOFT TISSUES: No acute osseous abnormality. IMPRESSION: 1. No acute process. Electronically signed by: Norman Gatlin MD 05/02/2024 07:39 PM EDT RP Workstation: [id number]     Assessment and Plan :     Discharge Instructions       1. Postviral syndrome (Primary) - DG Chest 2 View x-ray performed in UC shows no acute cardiopulmonary processes, no sign of consolidation or pneumonia. - azelastine (ASTELIN) 0.1 % nasal spray; Place 1 spray into both nostrils 2 (two) times daily. Use in each nostril as directed   Dispense: 30 mL; Refill: 1 - promethazine -dextromethorphan (PROMETHAZINE -DM) 6.25-15 MG/5ML syrup; Take 10 mLs by mouth 3 (three) times daily as needed.  Dispense: 240 mL; Refill: 0 -Continue to monitor symptoms for any change in severity if there is any escalation of current symptoms or development of new symptoms follow-up in ER for further evaluation and management.       Oda Placke B Ki Luckman   Shaheer Bonfield, [place], [name] 05/02/24 2017

## 2024-05-02 NOTE — Telephone Encounter (Signed)
 Noted thanks

## 2024-05-02 NOTE — Telephone Encounter (Signed)
 FYI Only or Action Required?: FYI only for provider.  Patient was last seen in primary care on 04/12/2024 by Tanda Bleacher, MD.  Called Nurse Triage reporting Cough.  Symptoms began a week ago.  Interventions attempted: OTC medications: mucinex and Rest, hydration, or home remedies.  Symptoms are: gradually improving.  Triage Disposition: See Physician Within 24 Hours  Patient/caregiver understands and will follow disposition?: Yes Copied from CRM #[id number]. Topic: Clinical - Red Word Triage >> May 02, 2024  1:56 PM Fonda T wrote: [facility] that prompted transfer to Nurse Triage: Patient calling, states she has an increased cough, with wheezing and thick colored yellow mucous, also having sinus pain and pressure, and ear feels stopped up.  Patient requesting an appointment as soon as possible for evaluation. Reason for Disposition  Earache  Answer Assessment - Initial Assessment Questions Self diagnosed Laryngitis last week, slowly getting better. Coughing up yellow instead of green. Started to have difficulty breathing at night, wheezing. Cough real bad in the morning. Ear feels congested denies pain   Salt and vinegar gargle, steam showers, mucinex, cough drops.  No acute visits available at this time. Advised UC to assess for Ear infection/need for inhaler/ albuterol . Pt agrees with Dispo. ED precautions advised and understood.   1. ONSET: When did the cough begin?      About a week 2. SEVERITY: How bad is the cough today?      Getting better- worse in AM, Wheezing at night 3. SPUTUM: Describe the color of your sputum (e.g., none, dry cough; clear, white, yellow, green)     Thick and yellow, was green  4. HEMOPTYSIS: Are you coughing up any blood? If Yes, ask: How much? (e.g., flecks, streaks, tablespoons, etc.)     Denies  5. DIFFICULTY BREATHING: Are you having difficulty breathing? If Yes, ask: How bad is it? (e.g., mild, moderate, severe)      Harder to  breath at night- sinus congesti 6. FEVER: Do you have a fever? If Yes, ask: What is your temperature, how was it measured, and when did it start?     denies 7. CARDIAC HISTORY: Do you have any history of heart disease? (e.g., heart attack, congestive heart failure)      HTN  8. LUNG HISTORY: Do you have any history of lung disease?  (e.g., pulmonary embolus, asthma, emphysema)     denies 9. PE RISK FACTORS: Do you have a history of blood clots? (or: recent major surgery, recent prolonged travel, bedridden)     denies 10. OTHER SYMPTOMS: Do you have any other symptoms? (e.g., runny nose, wheezing, chest pain)       Wheezing, ear congestion  Protocols used: Cough - Acute Productive-A-AH

## 2024-05-06 DIAGNOSIS — L909 Atrophic disorder of skin, unspecified: Secondary | ICD-10-CM | POA: Diagnosis not present

## 2024-05-09 ENCOUNTER — Encounter: Payer: Medicare Other | Admitting: Nurse Practitioner

## 2024-06-01 ENCOUNTER — Encounter: Payer: Self-pay | Admitting: Emergency Medicine

## 2024-06-01 ENCOUNTER — Ambulatory Visit: Admission: EM | Admit: 2024-06-01 | Discharge: 2024-06-01 | Disposition: A | Discharge status: Home / Self Care

## 2024-06-01 DIAGNOSIS — J209 Acute bronchitis, unspecified: Secondary | ICD-10-CM | POA: Diagnosis not present

## 2024-06-01 LAB — POCT RAPID STREP A (OFFICE): Rapid Strep A Screen: NEGATIVE

## 2024-06-01 MED ORDER — PREDNISONE 50 MG PO TABS: ORAL_TABLET | ORAL | 0 refills | Status: DC

## 2024-06-01 MED ORDER — GUAIFENESIN ER 600 MG PO TB12: 600.0000 mg | ORAL_TABLET | Freq: Two times a day (BID) | ORAL | 0 refills | Status: AC

## 2024-06-01 MED ORDER — PROMETHAZINE-DM 6.25-15 MG/5ML PO SYRP: 5.0000 mL | ORAL_SOLUTION | Freq: Four times a day (QID) | ORAL | 0 refills | Status: DC | PRN

## 2024-06-01 NOTE — ED Triage Notes (Signed)
 Pt presents c/o sore throat and congestion x 4 days. Pt states,  My throat hurts and I have congestion in my nose and chest. I was coughing up yellow mucus but now it's brownish. I can barely breathe out of my nose  Pt denies emesis and diarrhea.

## 2024-06-01 NOTE — ED Provider Notes (Signed)
 [facility]    CSN: [id number] Arrival date & time: 06/01/24  1745      History   Chief Complaint Chief Complaint  Patient presents with   Sore Throat   Nasal Congestion   Chest congestion     HPI Susan Carr is a 73 y.o. female.   Pt presents today due to 4 days of persistent cough. Pt states that it became productive of yellow brownish sputum 2 days ago. Pt states that she has been using Mucinex -DM for symptoms. Pt states that she is experiencing coughing spells and states that it is hard to breathe when she lays supine. Pt denies fever or chills, patient states that she is eating and drinking without issue. Pt is also experiencing hoarse voice.   The history is provided by the patient.  Sore Throat    Past Medical History:  Diagnosis Date   Arthritis    Hyperlipidemia    Labile hypertension     Patient Active Problem List   Diagnosis Date Noted   Aortic atherosclerosis (HCC) - per CT 05/2018 06/22/2019   GERD (gastroesophageal reflux disease) 03/04/2018   Overweight (BMI 25.0-29.9) 03/04/2018   Hypertension 02/20/2016   Osteoporosis 02/20/2016   Abnormal glucose 06/30/2013   Vitamin D  deficiency 06/30/2013   Insomnia 06/30/2013   Anxiety state 06/30/2013   Hyperlipidemia, mixed     Past Surgical History:  Procedure Laterality Date   NECK SURGERY     REDUCTION MAMMAPLASTY Bilateral 2007    OB History   No obstetric history on file.      Home Medications    Prior to Admission medications   Medication Sig Start Date End Date Taking? Authorizing Provider  guaiFENesin  (MUCINEX ) 600 MG 12 hr tablet Take 1 tablet (600 mg total) by mouth 2 (two) times daily for 10 days. 06/01/24 06/11/24 Yes Andra Corean BROCKS, PA-C  predniSONE  (DELTASONE ) 50 MG tablet 1 tab po daily for 5 days 06/01/24  Yes Andra Corean BROCKS, PA-C  promethazine -dextromethorphan (PROMETHAZINE -DM) 6.25-15 MG/5ML syrup Take 5 mLs by mouth 4 (four) times daily  as needed for cough. 06/01/24  Yes Andra Corean C, PA-C  acyclovir  (ZOVIRAX ) 200 MG capsule Take 1 capsule 2 x /day as needed for Mouth Ulcers 04/30/23   Tonita Fallow, MD  azelastine  (ASTELIN ) 0.1 % nasal spray Place 1 spray into both nostrils 2 (two) times daily. Use in each nostril as directed 05/02/24   Reddick, Johnathan B, NP  celecoxib  (CELEBREX ) 100 MG capsule TAKE 1 CAPSULE BY MOUTH TWICE DAILY WITH FOOD AS NEEDED FOR PAIN AND INFLAMMATION 01/01/24   Reddick, Johnathan B, NP  Cholecalciferol (VITAMIN D  PO) Take 5,000 Units by mouth daily.    [provider]  cyclobenzaprine  (FLEXERIL ) 5 MG tablet TAKE 1/2 TO 1 TABLET BY MOUTH THREE TIMES DAILY AS NEEDED FOR MUSCLE SPASM Patient not taking: Reported on 05/02/2024 08/10/23   Laurice President, NP  ezetimibe  (ZETIA ) 10 MG tablet Take  1 tablet  Daily  for Cholesterol 04/12/24   Tanda Bleacher, MD  Glycerin-Polysorbate 80 (REFRESH DRY EYE THERAPY OP) Apply 1 drop to eye daily.    [provider]  hydrOXYzine  (ATARAX ) 50 MG tablet TAKE ONE-HALF TO ONE (0.5-1) TABLET BY MOUTH AT NIGHT FOR INSOMNIA 02/08/24   Tanda Bleacher, MD  mirabegron ER (MYRBETRIQ) 25 MG TB24 tablet 1 tablet Orally Once a day for 30 days Patient not taking: Reported on 05/02/2024 11/17/23   [provider]  pantoprazole  (PROTONIX ) 40 MG  tablet Take  1 tablet  Daily  to Prevent Heartburn and Indigestion 01/01/24   Reddick, Johnathan B, NP  sucralfate  (CARAFATE ) 1 g tablet DISSOLVE 1 TABLET IN 2-3 OZ OF WATER AND DRINK 30 MINUTES BEFORE EACH MEAL AND DINNER(4 TIMES DAILY) 12/29/22   Wilkinson, Dana E, NP  sulfamethoxazole-trimethoprim (BACTRIM DS) 800-160 MG tablet Take 1 tablet by mouth 2 (two) times daily. Patient not taking: Reported on 05/02/2024 11/20/23   [provider]  vitamin C (ASCORBIC ACID) 500 MG tablet Take 500 mg by mouth 2 (two) times daily.    [provider]  vitamin E 100 UNIT capsule Take 100 Units by mouth every  morning.    [provider]  Zinc  50 MG TABS Take 1 tablet Daily 03/25/19   Tonita Fallow, MD    Family History Family History  Problem Relation Age of Onset   Heart attack Mother    Diabetes Mother    Heart attack Father    Diabetes Other    Multiple sclerosis Other     Social History Social History   Tobacco Use   Smoking status: Never   Smokeless tobacco: Never  Vaping Use   Vaping status: Never Used  Substance Use Topics   Alcohol use: No   Drug use: No     Allergies   Patient has no known allergies.   Review of Systems Review of Systems   Physical Exam Triage Vital Signs ED Triage Vitals  Encounter Vitals Group     BP 06/01/24 1907 (!) 176/92     Girls Systolic BP Percentile --      Girls Diastolic BP Percentile --      Boys Systolic BP Percentile --      Boys Diastolic BP Percentile --      Pulse Rate 06/01/24 1907 79     Resp 06/01/24 1907 18     Temp 06/01/24 1907 98.6 F (37 C)     Temp Source 06/01/24 1907 Oral     SpO2 06/01/24 1907 96 %     Weight 06/01/24 1906 182 lb 8.7 oz (82.8 kg)     Height --      Head Circumference --      Peak Flow --      Pain Score 06/01/24 1904 9     Pain Loc --      Pain Education --      Exclude from Growth Chart --    No data found.  Updated Vital Signs BP (!) 176/92 (BP Location: Left Arm)   Pulse 79   Temp 98.6 F (37 C) (Oral)   Resp 18   Wt 182 lb 8.7 oz (82.8 kg)   SpO2 96%   BMI 31.33 kg/m   Visual Acuity Right Eye Distance:   Left Eye Distance:   Bilateral Distance:    Right Eye Near:   Left Eye Near:    Bilateral Near:     Physical Exam Vitals and nursing note reviewed.  Constitutional:      General: She is not in acute distress.    Appearance: Normal appearance. She is not ill-appearing, toxic-appearing or diaphoretic.  HENT:     Nose: Congestion (moderately enlarged turbinates) present. No rhinorrhea.     Mouth/Throat:     Mouth: Mucous membranes are moist.      Pharynx: Oropharynx is clear. No oropharyngeal exudate or posterior oropharyngeal erythema.  Eyes:     General: No scleral icterus. Cardiovascular:  Rate and Rhythm: Normal rate and regular rhythm.     Heart sounds: Normal heart sounds.  Pulmonary:     Effort: Pulmonary effort is normal. No respiratory distress.     Breath sounds: Normal breath sounds. No wheezing or rhonchi.  Skin:    General: Skin is warm.     Findings: No lesion.  Neurological:     Mental Status: She is alert and oriented to person, place, and time.  Psychiatric:        Mood and Affect: Mood normal.        Behavior: Behavior normal.      UC Treatments / Results  Labs (all labs ordered are listed, but only abnormal results are displayed) Labs Reviewed  POCT RAPID STREP A (OFFICE) - Normal    EKG   Radiology No results found.  Procedures Procedures (including critical care time)  Medications Ordered in UC Medications - No data to display  Initial Impression / Assessment and Plan / UC Course  I have reviewed the triage vital signs and the nursing notes.  Pertinent labs & imaging results that were available during my care of the patient were reviewed by me and considered in my medical decision making (see chart for details).   Final Clinical Impressions(s) / UC Diagnoses   Final diagnoses:  Acute bronchitis, unspecified organism   Discharge Instructions   None    ED Prescriptions     Medication Sig Dispense Auth. Provider   promethazine -dextromethorphan (PROMETHAZINE -DM) 6.25-15 MG/5ML syrup Take 5 mLs by mouth 4 (four) times daily as needed for cough. 118 mL Karron Alvizo C, PA-C   guaiFENesin (MUCINEX) 600 MG 12 hr tablet Take 1 tablet (600 mg total) by mouth 2 (two) times daily for 10 days. 20 tablet Zailah Zagami C, PA-C   predniSONE  (DELTASONE ) 50 MG tablet 1 tab po daily for 5 days 5 tablet Andra Corean BROCKS, PA-C      PDMP not reviewed this encounter.    Andra Corean BROCKS, PA-C 06/01/24 1952

## 2024-06-03 DIAGNOSIS — M79602 Pain in left arm: Secondary | ICD-10-CM | POA: Diagnosis not present

## 2024-06-03 DIAGNOSIS — N644 Mastodynia: Secondary | ICD-10-CM | POA: Diagnosis not present

## 2024-06-06 ENCOUNTER — Other Ambulatory Visit: Payer: Self-pay | Admitting: Obstetrics and Gynecology

## 2024-06-06 DIAGNOSIS — N644 Mastodynia: Secondary | ICD-10-CM

## 2024-06-21 ENCOUNTER — Encounter

## 2024-06-21 ENCOUNTER — Other Ambulatory Visit

## 2024-06-24 ENCOUNTER — Telehealth: Payer: Self-pay | Admitting: Family Medicine

## 2024-06-24 NOTE — Telephone Encounter (Addendum)
 Pt requesting refills for ezetimibe , acyclovir , sulcrafate, and clelecoxib sent to [facility] pharmacy on [address]. Pt also requesting hydroxyzine  sent to [facility] pharmacy due to price of medication. Does pt need appt for medication refill? Last visit was for her physical on 09/30. Please advise.

## 2024-07-01 ENCOUNTER — Other Ambulatory Visit: Payer: Self-pay | Admitting: Family Medicine

## 2024-07-01 ENCOUNTER — Ambulatory Visit: Payer: Self-pay

## 2024-07-01 DIAGNOSIS — B029 Zoster without complications: Secondary | ICD-10-CM

## 2024-07-01 DIAGNOSIS — Z760 Encounter for issue of repeat prescription: Secondary | ICD-10-CM

## 2024-07-01 MED ORDER — CELECOXIB 100 MG PO CAPS: ORAL_CAPSULE | ORAL | 1 refills | Status: AC

## 2024-07-01 MED ORDER — HYDROXYZINE HCL 50 MG PO TABS: ORAL_TABLET | ORAL | 2 refills | Status: AC

## 2024-07-01 MED ORDER — EZETIMIBE 10 MG PO TABS: ORAL_TABLET | ORAL | 1 refills | Status: AC

## 2024-07-01 MED ORDER — SUCRALFATE 1 G PO TABS: ORAL_TABLET | ORAL | 0 refills | Status: AC

## 2024-07-01 MED ORDER — ACYCLOVIR 200 MG PO CAPS: ORAL_CAPSULE | ORAL | 3 refills | Status: DC

## 2024-07-01 NOTE — Telephone Encounter (Signed)
 Noted thanks

## 2024-07-01 NOTE — Telephone Encounter (Signed)
 FYI Only or Action Required?: FYI only for provider: UC advised .  Patient was last seen in primary care on 04/12/2024 by Tanda Bleacher, MD.  Called Nurse Triage reporting Sore Throat.  Symptoms began several days ago.  Interventions attempted: OTC medications: sudafed, mucinex .  Symptoms are: gradually worsening.  Triage Disposition: No disposition on file.  Patient/caregiver understands and will follow disposition?: Maybe an 8, doesn't hurt all the time   Copied from CRM #[id number]. Topic: Clinical - Red Word Triage >> Jul 01, 2024  4:16 PM Joesph NOVAK wrote: Red Word that prompted transfer to Nurse Triage: Sore throat/ , right arm is in pain.SABRA Also sore under arm. Reason for Disposition  Earache also present  Answer Assessment - Initial Assessment Questions Productive Cough in morning, has been coughing up yellow mucus in the morning for weeks. Sudafed and mucinex  yesterday. Patient instructed to go to UC since she needs to be seen before next week. 1. ONSET: When did the throat start hurting? (Hours or days ago)      Coughs in morning, has been coughing up yellow mucus in the morning for weeks.  4.  VIRAL SYMPTOMS: Are there any symptoms of a cold, such as a runny nose, cough, hoarse voice or red eyes?      Earache, Runny nose cough, sore throat 5. FEVER: Do you have a fever? If Yes, ask: What is your temperature, how was it measured, and when did it start?     denies  Answer Assessment - Initial Assessment Questions Ibuprofen  and celebrex . 1. ONSET: When did the pain start?     Around 1 month ago 2. LOCATION: Where is the pain located?     Left 3. PAIN: How bad is the pain? (Scale 0-10; or none, mild, moderate, severe)     Maybe an 8, doesn't hurt all the time, just when she tried to use it. 4. WORK OR EXERCISE: Has there been any recent work or exercise that involved this part of the body?     denies 5. CAUSE: What do you think is causing the arm pain?      unsure 6. OTHER SYMPTOMS: Do you have any other symptoms? (e.g., neck pain, swelling, rash, fever, numbness, weakness)     denies  Protocols used: Sore Throat-A-AH, Arm Pain-A-AH

## 2024-07-01 NOTE — Telephone Encounter (Unsigned)
 Copied from CRM #[id number]. Topic: Clinical - Medication Refill >> Jul 01, 2024  4:14 PM Joesph B wrote: Medication: hydrOXYzine  (ATARAX ) 50 MG tablet  Has the patient contacted their pharmacy? Yes (Agent: If no, request that the patient contact the pharmacy for the refill. If patient does not wish to contact the pharmacy document the reason why and proceed with request.) (Agent: If yes, when and what did the pharmacy advise?)  This is the patient's preferred pharmacy:    [facility] [id number] - [place], [place] - [address] [place] [address] Phone: [phone] Fax: [phone]  Is this the correct pharmacy for this prescription? Yes If no, delete pharmacy and type the correct one.   Has the prescription been filled recently? Yes  Is the patient out of the medication? Yes  Has the patient been seen for an appointment in the last year OR does the patient have an upcoming appointment? Yes  Can we respond through MyChart? Yes  Agent: Please be advised that Rx refills may take up to 3 business days. We ask that you follow-up with your pharmacy.

## 2024-07-02 ENCOUNTER — Ambulatory Visit: Admission: EM | Admit: 2024-07-02 | Discharge: 2024-07-02 | Disposition: A | Discharge status: Home / Self Care

## 2024-07-02 ENCOUNTER — Encounter: Payer: Self-pay | Admitting: Emergency Medicine

## 2024-07-02 DIAGNOSIS — H9202 Otalgia, left ear: Secondary | ICD-10-CM

## 2024-07-02 DIAGNOSIS — J069 Acute upper respiratory infection, unspecified: Secondary | ICD-10-CM | POA: Diagnosis not present

## 2024-07-02 DIAGNOSIS — R051 Acute cough: Secondary | ICD-10-CM

## 2024-07-02 LAB — POCT RAPID STREP A (OFFICE): Rapid Strep A Screen: NEGATIVE

## 2024-07-02 MED ORDER — AMOXICILLIN-POT CLAVULANATE 875-125 MG PO TABS: 1.0000 | ORAL_TABLET | Freq: Two times a day (BID) | ORAL | 0 refills | Status: DC

## 2024-07-02 MED ORDER — BENZONATATE 100 MG PO CAPS: 100.0000 mg | ORAL_CAPSULE | Freq: Three times a day (TID) | ORAL | 0 refills | Status: DC

## 2024-07-02 NOTE — ED Provider Notes (Signed)
 " [facility]    CSN: [id number] Arrival date & time: 07/02/24  1209      History   Chief Complaint Chief Complaint  Patient presents with   URI    HPI Susan Carr is a 73 y.o. female.   73 year old female presents urgent care with complaints of persistent productive cough, left ear pain and congestion.  She reports her symptoms started about 2 to 3 weeks ago.  She was seen in clinic for this and was prescribed prednisone , Promethazine  DM and Mucinex .  She reports that she was not able to take the prednisone  as she has an intolerance to it.  She never picked up the cough syrup.  She reports that her symptoms have gotten worse and now her left ear is hurting more significantly and the cough has gotten worse.  She denies shortness of breath or chest pain, fevers or chills, nausea or vomiting.   URI Presenting symptoms: congestion, cough, ear pain (Left) and sore throat   Presenting symptoms: no fever   Associated symptoms: no arthralgias     Past Medical History:  Diagnosis Date   Arthritis    Hyperlipidemia    Labile hypertension     Patient Active Problem List   Diagnosis Date Noted   Aortic atherosclerosis (HCC) - per CT 05/2018 06/22/2019   GERD (gastroesophageal reflux disease) 03/04/2018   Overweight (BMI 25.0-29.9) 03/04/2018   Hypertension 02/20/2016   Osteoporosis 02/20/2016   Abnormal glucose 06/30/2013   Vitamin D  deficiency 06/30/2013   Insomnia 06/30/2013   Anxiety state 06/30/2013   Hyperlipidemia, mixed     Past Surgical History:  Procedure Laterality Date   NECK SURGERY     REDUCTION MAMMAPLASTY Bilateral 2007    OB History   No obstetric history on file.      Home Medications    Prior to Admission medications  Medication Sig Start Date End Date Taking? Authorizing Provider  amoxicillin -clavulanate (AUGMENTIN ) 875-125 MG tablet Take 1 tablet by mouth every 12 (twelve) hours. 07/02/24  Yes Netty Sullivant A, PA-C   benzonatate  (TESSALON ) 100 MG capsule Take 1 capsule (100 mg total) by mouth every 8 (eight) hours. 07/02/24  Yes Kavita Bartl A, PA-C  omeprazole (PRILOSEC) 20 MG capsule 1 capsule. 06/12/17  Yes [provider]  acyclovir  (ZOVIRAX ) 200 MG capsule Take 1 capsule 2 x /day as needed for Mouth Ulcers 07/01/24   Tanda Bleacher, MD  azelastine  (ASTELIN ) 0.1 % nasal spray Place 1 spray into both nostrils 2 (two) times daily. Use in each nostril as directed 05/02/24   Reddick, Johnathan B, NP  celecoxib  (CELEBREX ) 100 MG capsule TAKE 1 CAPSULE BY MOUTH TWICE DAILY WITH FOOD AS NEEDED FOR PAIN AND INFLAMMATION 07/01/24   Tanda Bleacher, MD  Cholecalciferol (VITAMIN D  PO) Take 5,000 Units by mouth daily.    [provider]  cyclobenzaprine  (FLEXERIL ) 5 MG tablet TAKE 1/2 TO 1 TABLET BY MOUTH THREE TIMES DAILY AS NEEDED FOR MUSCLE SPASM Patient not taking: Reported on 05/02/2024 08/10/23   Cranford, Tonya, NP  ezetimibe  (ZETIA ) 10 MG tablet Take  1 tablet  Daily  for Cholesterol 07/01/24   Tanda Bleacher, MD  Glycerin-Polysorbate 80 (REFRESH DRY EYE THERAPY OP) Apply 1 drop to eye daily.    [provider]  hydrOXYzine  (ATARAX ) 50 MG tablet TAKE ONE-HALF TO ONE (0.5-1) TABLET BY MOUTH AT NIGHT FOR INSOMNIA 07/01/24   Tanda Bleacher, MD  mirabegron ER (MYRBETRIQ) 25 MG TB24 tablet 1 tablet  Orally Once a day for 30 days Patient not taking: Reported on 05/02/2024 11/17/23   [provider]  pantoprazole  (PROTONIX ) 40 MG tablet Take  1 tablet  Daily  to Prevent Heartburn and Indigestion 01/01/24   Reddick, Johnathan B, NP  predniSONE  (DELTASONE ) 50 MG tablet 1 tab po daily for 5 days 06/01/24   Andra Corean BROCKS, PA-C  promethazine -dextromethorphan (PROMETHAZINE -DM) 6.25-15 MG/5ML syrup Take 5 mLs by mouth 4 (four) times daily as needed for cough. 06/01/24   Andra Corean BROCKS, PA-C  sucralfate  (CARAFATE ) 1 g tablet DISSOLVE 1 TABLET IN 2-3 OZ OF WATER AND DRINK 30  MINUTES BEFORE EACH MEAL AND DINNER(4 TIMES DAILY) 07/01/24   Tanda Bleacher, MD  sulfamethoxazole-trimethoprim (BACTRIM DS) 800-160 MG tablet Take 1 tablet by mouth 2 (two) times daily. Patient not taking: Reported on 05/02/2024 11/20/23   [provider]  vitamin C (ASCORBIC ACID) 500 MG tablet Take 500 mg by mouth 2 (two) times daily.    [provider]  vitamin E 100 UNIT capsule Take 100 Units by mouth every morning.    [provider]  Zinc  50 MG TABS Take 1 tablet Daily 03/25/19   Tonita Fallow, MD    Family History Family History  Problem Relation Age of Onset   Heart attack Mother    Diabetes Mother    Heart attack Father    Diabetes Other    Multiple sclerosis Other     Social History Social History[1]   Allergies   Patient has no known allergies.   Review of Systems Review of Systems  Constitutional:  Negative for chills and fever.  HENT:  Positive for congestion, ear pain (Left) and sore throat.   Eyes:  Negative for pain and visual disturbance.  Respiratory:  Positive for cough. Negative for shortness of breath.   Cardiovascular:  Negative for chest pain and palpitations.  Gastrointestinal:  Negative for abdominal pain and vomiting.  Genitourinary:  Negative for dysuria and hematuria.  Musculoskeletal:  Negative for arthralgias and back pain.  Skin:  Negative for color change and rash.  Neurological:  Negative for seizures and syncope.  All other systems reviewed and are negative.    Physical Exam Triage Vital Signs ED Triage Vitals  Encounter Vitals Group     BP 07/02/24 1257 (!) 161/77     Girls Systolic BP Percentile --      Girls Diastolic BP Percentile --      Boys Systolic BP Percentile --      Boys Diastolic BP Percentile --      Pulse Rate 07/02/24 1257 96     Resp 07/02/24 1257 18     Temp 07/02/24 1257 98 F (36.7 C)     Temp Source 07/02/24 1257 Oral     SpO2 07/02/24 1257 96 %     Weight 07/02/24 1257 182 lb  8.7 oz (82.8 kg)     Height --      Head Circumference --      Peak Flow --      Pain Score 07/02/24 1256 8     Pain Loc --      Pain Education --      Exclude from Growth Chart --    No data found.  Updated Vital Signs BP (!) 161/77 (BP Location: Left Arm)   Pulse 96   Temp 98 F (36.7 C) (Oral)   Resp 18   Wt 182 lb 8.7 oz (82.8 kg)  LMP  (LMP Unknown)   SpO2 96%   BMI 31.33 kg/m   Visual Acuity Right Eye Distance:   Left Eye Distance:   Bilateral Distance:    Right Eye Near:   Left Eye Near:    Bilateral Near:     Physical Exam Vitals and nursing note reviewed.  Constitutional:      General: She is not in acute distress.    Appearance: She is well-developed.  HENT:     Head: Normocephalic and atraumatic.     Right Ear: Tympanic membrane normal.     Left Ear: Tympanic membrane normal.     Nose: Nasal tenderness, congestion and rhinorrhea present. Rhinorrhea is purulent.     Mouth/Throat:     Mouth: Mucous membranes are moist.  Eyes:     Conjunctiva/sclera: Conjunctivae normal.  Cardiovascular:     Rate and Rhythm: Normal rate and regular rhythm.     Heart sounds: No murmur heard. Pulmonary:     Effort: Pulmonary effort is normal. No respiratory distress.     Breath sounds: Normal breath sounds.  Abdominal:     Palpations: Abdomen is soft.     Tenderness: There is no abdominal tenderness.  Musculoskeletal:        General: No swelling.     Cervical back: Neck supple.  Skin:    General: Skin is warm and dry.     Capillary Refill: Capillary refill takes less than 2 seconds.  Neurological:     Mental Status: She is alert.  Psychiatric:        Mood and Affect: Mood normal.      UC Treatments / Results  Labs (all labs ordered are listed, but only abnormal results are displayed) Labs Reviewed  POCT RAPID STREP A (OFFICE) - Normal    EKG   Radiology No results found.  Procedures Procedures (including critical care time)  Medications  Ordered in UC Medications - No data to display  Initial Impression / Assessment and Plan / UC Course  I have reviewed the triage vital signs and the nursing notes.  Pertinent labs & imaging results that were available during my care of the patient were reviewed by me and considered in my medical decision making (see chart for details).     Acute upper respiratory infection  Acute cough  Otalgia of left ear  Symptoms and physical exam findings are consistent with an upper respiratory infection that is likely bacterial in nature especially given the duration of symptoms.  Due to the failure of conservative management, we recommend antibiotic treatment.  Will also call and medication to help with the cough.  We will treat with the following: Augmentin  875 mg twice daily for 7 days.  This is an antibiotic.  Take this with food.  Benzonatate  (tessalon ) 100 mg every 8 hours as needed for cough.   Continue to alternate Tylenol  and ibuprofen  for pain or fevers Make sure to stay hydrated by drinking plenty of water. Return to urgent care or PCP if symptoms worsen or fail to resolve.    Final Clinical Impressions(s) / UC Diagnoses   Final diagnoses:  Acute upper respiratory infection  Acute cough  Otalgia of left ear     Discharge Instructions      Symptoms and physical exam findings are consistent with an upper respiratory infection that is likely bacterial in nature especially given the duration of symptoms.  Due to the failure of conservative management, we recommend antibiotic treatment.  Will  also call and medication to help with the cough.  We will treat with the following: Augmentin  875 mg twice daily for 7 days.  This is an antibiotic.  Take this with food.  Benzonatate  (tessalon ) 100 mg every 8 hours as needed for cough.   Continue to alternate Tylenol  and ibuprofen  for pain or fevers Make sure to stay hydrated by drinking plenty of water. Return to urgent care or PCP if  symptoms worsen or fail to resolve.      ED Prescriptions     Medication Sig Dispense Auth. Provider   benzonatate  (TESSALON ) 100 MG capsule Take 1 capsule (100 mg total) by mouth every 8 (eight) hours. 21 capsule Hernandez Losasso A, PA-C   amoxicillin -clavulanate (AUGMENTIN ) 875-125 MG tablet Take 1 tablet by mouth every 12 (twelve) hours. 14 tablet Teresa Almarie LABOR, PA-C      PDMP not reviewed this encounter.    [1]  Social History Tobacco Use   Smoking status: Never   Smokeless tobacco: Never  Vaping Use   Vaping status: Never Used  Substance Use Topics   Alcohol use: No   Drug use: No     Teresa Almarie LABOR, PA-C 07/02/24 1359  "

## 2024-07-02 NOTE — Discharge Instructions (Addendum)
 Symptoms and physical exam findings are consistent with an upper respiratory infection that is likely bacterial in nature especially given the duration of symptoms.  Due to the failure of conservative management, we recommend antibiotic treatment.  Will also call and medication to help with the cough.  We will treat with the following: Augmentin  875 mg twice daily for 7 days.  This is an antibiotic.  Take this with food.  Benzonatate  (tessalon ) 100 mg every 8 hours as needed for cough.   Continue to alternate Tylenol  and ibuprofen  for pain or fevers Make sure to stay hydrated by drinking plenty of water. Return to urgent care or PCP if symptoms worsen or fail to resolve.

## 2024-07-02 NOTE — ED Triage Notes (Signed)
 Pt presents c/o sore throat, L ear pain and cough x yesterday. Pt states,  My throat is really dry in the morning and I been coughing up yellow mucus. I been here before about 3 weeks ago and it seems like it wants to get bad again. This morning I was coughing so hard. My left ear really hurts.  Pt denies emesis and diarrhea.

## 2024-07-05 NOTE — Telephone Encounter (Signed)
 Duplicate request- filled 07/01/24 Requested Prescriptions  Pending Prescriptions Disp Refills   hydrOXYzine  (ATARAX ) 50 MG tablet 60 tablet 2    Sig: TAKE ONE-HALF TO ONE (0.5-1) TABLET BY MOUTH AT NIGHT FOR INSOMNIA     Ear, Nose, and Throat:  Antihistamines 2 Passed - 07/05/2024  3:29 PM      Passed - Cr in normal range and within 360 days    Creat  Date Value Ref Range Status  05/07/2023 0.80 0.60 - 1.00 mg/dL Final   Creatinine, Ser  Date Value Ref Range Status  04/12/2024 0.85 0.57 - 1.00 mg/dL Final   Creatinine, Urine  Date Value Ref Range Status  05/07/2023 111 20 - 275 mg/dL Final         Passed - Valid encounter within last 12 months    Recent Outpatient Visits           2 months ago Annual physical exam   [facility], MD   4 months ago Essential hypertension   [facility], MD       Future Appointments             In 1 month Susan Delon SAILOR, DO [facility]

## 2024-07-08 ENCOUNTER — Encounter

## 2024-07-19 ENCOUNTER — Other Ambulatory Visit: Payer: Self-pay | Admitting: Nurse Practitioner

## 2024-07-19 DIAGNOSIS — N644 Mastodynia: Secondary | ICD-10-CM

## 2024-07-20 ENCOUNTER — Ambulatory Visit (INDEPENDENT_AMBULATORY_CARE_PROVIDER_SITE_OTHER)

## 2024-07-20 ENCOUNTER — Other Ambulatory Visit: Payer: Self-pay | Admitting: Nurse Practitioner

## 2024-07-20 ENCOUNTER — Encounter: Payer: Self-pay | Admitting: Family

## 2024-07-20 ENCOUNTER — Ambulatory Visit (INDEPENDENT_AMBULATORY_CARE_PROVIDER_SITE_OTHER): Payer: Self-pay | Admitting: Family

## 2024-07-20 VITALS — BP 130/78 | HR 83 | Temp 98.9°F | Resp 18 | Ht 64.0 in | Wt 186.0 lb

## 2024-07-20 DIAGNOSIS — R051 Acute cough: Secondary | ICD-10-CM | POA: Diagnosis not present

## 2024-07-20 DIAGNOSIS — H9202 Otalgia, left ear: Secondary | ICD-10-CM

## 2024-07-20 DIAGNOSIS — M25519 Pain in unspecified shoulder: Secondary | ICD-10-CM

## 2024-07-20 DIAGNOSIS — J069 Acute upper respiratory infection, unspecified: Secondary | ICD-10-CM

## 2024-07-20 DIAGNOSIS — N644 Mastodynia: Secondary | ICD-10-CM

## 2024-07-20 MED ORDER — BENZONATATE 100 MG PO CAPS: 100.0000 mg | ORAL_CAPSULE | Freq: Three times a day (TID) | ORAL | 0 refills | Status: DC

## 2024-07-20 MED ORDER — AMOXICILLIN-POT CLAVULANATE 875-125 MG PO TABS: 1.0000 | ORAL_TABLET | Freq: Two times a day (BID) | ORAL | 0 refills | Status: DC

## 2024-07-20 NOTE — Progress Notes (Signed)
 Patient has a cough that has been going on since November and patient has mucous.  The cough is worst at night.  Left arm pain at the top and in the shoulder when she lifts her arm and she can not snap her bra in the back.  Medication refill, left ear inside the ear drum is sore

## 2024-07-20 NOTE — Progress Notes (Signed)
 "   Patient ID: Susan Carr, female    DOB: Oct 19, 1950  MRN: [id number]  CC: Urgent Care Follow-Up  Subjective: Susan Carr is a 74 y.o. female who presents for Urgent Care follow-up.   Her concerns today include:  - Patient seen on 07/02/2024 (41 minutes) at [facility]) for acute upper respiratory infection and additional diagnoses. Today patient states cough with increased mucus persisting especially at bedtime. Denies red flag symptoms. States Benzonatate  helped and she is also taking over-the-counter medications to help. Patient states left ear pain persisting and denies red flag symptoms.  - Left shoulder pain. Denies recent trauma/injury and red flag symptoms.  - Patient states she needs two medications refilled but she isn't sure which ones because she left her paper at home. Patient states she plans to call our office when she returns home and give update.   Patient Active Problem List   Diagnosis Date Noted   Aortic atherosclerosis (HCC) - per CT 05/2018 06/22/2019   GERD (gastroesophageal reflux disease) 03/04/2018   Overweight (BMI 25.0-29.9) 03/04/2018   Hypertension 02/20/2016   Osteoporosis 02/20/2016   Abnormal glucose 06/30/2013   Vitamin D  deficiency 06/30/2013   Insomnia 06/30/2013   Anxiety state 06/30/2013   Hyperlipidemia, mixed      Medications Ordered Prior to Encounter[1]  Allergies[2]  Social History   Socioeconomic History   Marital status: Widowed    Spouse name: Not on file   Number of children: Not on file   Years of education: Not on file   Highest education level: Not on file  Occupational History   Not on file  Tobacco Use   Smoking status: Never   Smokeless tobacco: Never  Vaping Use   Vaping status: Never Used  Substance and Sexual Activity   Alcohol use: No   Drug use: No   Sexual activity: Not Currently  Other Topics Concern   Not on file  Social History Narrative   **  Merged History Encounter **       Social Drivers of Health   Tobacco Use: Low Risk (07/20/2024)   Patient History    Smoking Tobacco Use: Never    Smokeless Tobacco Use: Never    Passive Exposure: Not on file  Financial Resource Strain: Not on file  Food Insecurity: No Food Insecurity (04/12/2024)   Epic    Worried About Programme Researcher, Broadcasting/film/video in the Last Year: Never true    Ran Out of Food in the Last Year: Never true  Transportation Needs: No Transportation Needs (04/12/2024)   Epic    Lack of Transportation (Medical): No    Lack of Transportation (Non-Medical): No  Physical Activity: Not on file  Stress: Not on file  Social Connections: Not on file  Intimate Partner Violence: Not At Risk (04/12/2024)   Epic    Fear of Current or Ex-Partner: No    Emotionally Abused: No    Physically Abused: No    Sexually Abused: No  Depression (PHQ2-9): Low Risk (07/20/2024)   Depression (PHQ2-9)    PHQ-2 Score: 0  Alcohol Screen: Not on file  Housing: Low Risk (04/12/2024)   Epic    Unable to Pay for Housing in the Last Year: No    Number of Times Moved in the Last Year: 0    Homeless in the Last Year: No  Utilities: Not At Risk (04/12/2024)   Epic    Threatened with loss of utilities: No  Health Literacy: Not on file    Family History  Problem Relation Age of Onset   Heart attack Mother    Diabetes Mother    Heart attack Father    Diabetes Other    Multiple sclerosis Other     Past Surgical History:  Procedure Laterality Date   NECK SURGERY     REDUCTION MAMMAPLASTY Bilateral 2007    ROS: Review of Systems Negative except as stated above  PHYSICAL EXAM: BP 130/78   Pulse 83   Temp 98.9 F (37.2 C) (Oral)   Resp 18   Ht 5' 4 (1.626 m)   Wt 186 lb (84.4 kg)   LMP  (LMP Unknown)   SpO2 95%   BMI 31.93 kg/m   Physical Exam HENT:     Head: Normocephalic and atraumatic.     Right Ear: Tympanic membrane, ear canal and external ear normal.     Left Ear: Tympanic  membrane, ear canal and external ear normal.     Nose: Nose normal.     Mouth/Throat:     Mouth: Mucous membranes are moist.     Pharynx: Oropharynx is clear.  Eyes:     Extraocular Movements: Extraocular movements intact.     Conjunctiva/sclera: Conjunctivae normal.     Pupils: Pupils are equal, round, and reactive to light.  Cardiovascular:     Rate and Rhythm: Normal rate and regular rhythm.     Pulses: Normal pulses.     Heart sounds: Normal heart sounds.  Pulmonary:     Effort: Pulmonary effort is normal.     Breath sounds: Normal breath sounds.  Musculoskeletal:        General: Normal range of motion.     Right shoulder: Normal.     Left shoulder: Normal.     Right upper arm: Normal.     Left upper arm: Normal.     Right elbow: Normal.     Left elbow: Normal.     Right forearm: Normal.     Left forearm: Normal.     Right wrist: Normal.     Left wrist: Normal.     Right hand: Normal.     Left hand: Normal.     Cervical back: Normal range of motion and neck supple.  Neurological:     General: No focal deficit present.     Mental Status: She is alert and oriented to person, place, and time.  Psychiatric:        Mood and Affect: Mood normal.        Behavior: Behavior normal.     ASSESSMENT AND PLAN: 1. Acute upper respiratory infection (Primary) 2. Acute cough - Patient today in office with no cardiopulmonary/acute distress.  - Benzonatate  as prescribed. Counseled on medication adherence/adverse effects.  - Routine screening.  - Follow-up with primary provider as scheduled.  - benzonatate  (TESSALON ) 100 MG capsule; Take 1 capsule (100 mg total) by mouth every 8 (eight) hours.  Dispense: 21 capsule; Refill: 0 - COVID-19, Flu A+B and RSV - POCT rapid strep A; Future - Culture, Group A Strep  3. Otalgia, left ear - Amoxicillin -Clavulanate as prescribed. Counseled on medication adherence/adverse effects.  - Referral to ENT for evaluation/management.  - Follow-up  with primary provider as scheduled.  - amoxicillin -clavulanate (AUGMENTIN ) 875-125 MG tablet; Take 1 tablet by mouth every 12 (twelve) hours.  Dispense: 14 tablet; Refill: 0 - Ambulatory referral to ENT  4. Arthralgia of shoulder, unspecified laterality - Patient declined  pharmacological therapy.  - DG Shoulder Left for evaluation.  - Follow-up with primary provider as scheduled. - DG Shoulder Left; Future  Patient was given the opportunity to ask questions.  Patient verbalized understanding of the plan and was able to repeat key elements of the plan. Patient was given clear instructions to go to Emergency Department or return to medical center if symptoms don't improve, worsen, or new problems develop.The patient verbalized understanding.   Orders Placed This Encounter  Procedures   COVID-19, Flu A+B and RSV   Culture, Group A Strep   DG Shoulder Left   Ambulatory referral to ENT   POCT rapid strep A     Requested Prescriptions   Signed Prescriptions Disp Refills   benzonatate  (TESSALON ) 100 MG capsule 21 capsule 0    Sig: Take 1 capsule (100 mg total) by mouth every 8 (eight) hours.   amoxicillin -clavulanate (AUGMENTIN ) 875-125 MG tablet 14 tablet 0    Sig: Take 1 tablet by mouth every 12 (twelve) hours.    Return for Follow-Up or next available with Raguel Blush, MD.  Greig JINNY Chute, NP      [1]  Current Outpatient Medications on File Prior to Visit  Medication Sig Dispense Refill   acyclovir  (ZOVIRAX ) 200 MG capsule Take 1 capsule 2 x /day as needed for Mouth Ulcers 180 capsule 3   azelastine  (ASTELIN ) 0.1 % nasal spray Place 1 spray into both nostrils 2 (two) times daily. Use in each nostril as directed 30 mL 1   celecoxib  (CELEBREX ) 100 MG capsule TAKE 1 CAPSULE BY MOUTH TWICE DAILY WITH FOOD AS NEEDED FOR PAIN AND INFLAMMATION 60 capsule 1   Cholecalciferol (VITAMIN D  PO) Take 5,000 Units by mouth daily.     ezetimibe  (ZETIA ) 10 MG tablet Take  1 tablet  Daily  for  Cholesterol 90 tablet 1   hydrOXYzine  (ATARAX ) 50 MG tablet TAKE ONE-HALF TO ONE (0.5-1) TABLET BY MOUTH AT NIGHT FOR INSOMNIA 60 tablet 2   omeprazole (PRILOSEC) 20 MG capsule 1 capsule.     pantoprazole  (PROTONIX ) 40 MG tablet Take  1 tablet  Daily  to Prevent Heartburn and Indigestion 30 tablet 1   predniSONE  (DELTASONE ) 50 MG tablet 1 tab po daily for 5 days 5 tablet 0   promethazine -dextromethorphan (PROMETHAZINE -DM) 6.25-15 MG/5ML syrup Take 5 mLs by mouth 4 (four) times daily as needed for cough. 118 mL 0   sucralfate  (CARAFATE ) 1 g tablet DISSOLVE 1 TABLET IN 2-3 OZ OF WATER AND DRINK 30 MINUTES BEFORE EACH MEAL AND DINNER(4 TIMES DAILY) 360 tablet 0   cyclobenzaprine  (FLEXERIL ) 5 MG tablet TAKE 1/2 TO 1 TABLET BY MOUTH THREE TIMES DAILY AS NEEDED FOR MUSCLE SPASM (Patient not taking: Reported on 05/02/2024) 90 tablet 0   Glycerin-Polysorbate 80 (REFRESH DRY EYE THERAPY OP) Apply 1 drop to eye daily.     mirabegron ER (MYRBETRIQ) 25 MG TB24 tablet 1 tablet Orally Once a day for 30 days (Patient not taking: Reported on 05/02/2024)     sulfamethoxazole-trimethoprim (BACTRIM DS) 800-160 MG tablet Take 1 tablet by mouth 2 (two) times daily. (Patient not taking: Reported on 05/02/2024)     vitamin C (ASCORBIC ACID) 500 MG tablet Take 500 mg by mouth 2 (two) times daily.     vitamin E 100 UNIT capsule Take 100 Units by mouth every morning.     Zinc  50 MG TABS Take 1 tablet Daily  0   No current facility-administered medications on file prior to  visit.  [2]  Allergies Allergen Reactions   Gabapentin  Other (See Comments)    Patient passed out   "

## 2024-07-21 ENCOUNTER — Ambulatory Visit
Admission: RE | Admit: 2024-07-21 | Discharge: 2024-07-21 | Disposition: A | Discharge status: Home / Self Care | Source: Ambulatory Visit | Attending: Obstetrics and Gynecology | Admitting: Obstetrics and Gynecology

## 2024-07-21 DIAGNOSIS — N644 Mastodynia: Secondary | ICD-10-CM

## 2024-07-21 LAB — COVID-19, FLU A+B AND RSV
Influenza A, NAA: NOT DETECTED
Influenza B, NAA: NOT DETECTED
RSV, NAA: NOT DETECTED
SARS-CoV-2, NAA: NOT DETECTED

## 2024-07-22 ENCOUNTER — Ambulatory Visit: Payer: Self-pay | Admitting: Family

## 2024-07-22 DIAGNOSIS — M25519 Pain in unspecified shoulder: Secondary | ICD-10-CM

## 2024-07-22 LAB — CULTURE, GROUP A STREP: Strep A Culture: NEGATIVE

## 2024-07-25 ENCOUNTER — Other Ambulatory Visit: Payer: Self-pay | Admitting: Family Medicine

## 2024-07-25 DIAGNOSIS — Z760 Encounter for issue of repeat prescription: Secondary | ICD-10-CM

## 2024-08-05 ENCOUNTER — Telehealth: Payer: Self-pay | Admitting: Family Medicine

## 2024-08-05 NOTE — Telephone Encounter (Signed)
 Attempted to call pt to advise. No answer, unable to LVM

## 2024-08-05 NOTE — Telephone Encounter (Signed)
 Copied from CRM #[id number]. Topic: Referral - Status >> Aug 04, 2024  5:13 PM Kevelyn M wrote: Reason for CRM: Patient calling in because she needs left shoulder referral sent to Dr. Dalldorf. The referral looks to be closed. I told the patient someone may have already tried to call her from Dr. Delynn office.  Call back # [phone]

## 2024-08-12 ENCOUNTER — Encounter: Payer: Self-pay | Admitting: *Deleted

## 2024-08-12 ENCOUNTER — Ambulatory Visit
Admission: EM | Admit: 2024-08-12 | Discharge: 2024-08-12 | Disposition: A | Discharge status: Home / Self Care | Attending: Nurse Practitioner | Admitting: Nurse Practitioner

## 2024-08-12 DIAGNOSIS — R051 Acute cough: Secondary | ICD-10-CM

## 2024-08-12 DIAGNOSIS — J069 Acute upper respiratory infection, unspecified: Secondary | ICD-10-CM

## 2024-08-12 LAB — POCT INFLUENZA A/B
Influenza A, POC: NEGATIVE
Influenza B, POC: NEGATIVE

## 2024-08-12 LAB — POC SOFIA SARS ANTIGEN FIA: SARS Coronavirus 2 Ag: NEGATIVE

## 2024-08-12 MED ORDER — BENZONATATE 200 MG PO CAPS: 200.0000 mg | ORAL_CAPSULE | Freq: Three times a day (TID) | ORAL | 0 refills | Status: AC | PRN

## 2024-08-12 NOTE — ED Triage Notes (Signed)
 Pt reports nasal congestion, right ear pain, and headache since Wednesday. Symptoms worse yesterday. States last night I couldn't sleep because my nose was stopped up. Subjective fever last night. She has tried dayquil for her symptoms without relief

## 2024-08-12 NOTE — ED Provider Notes (Signed)
 " [facility]    CSN: [id number] Arrival date & time: 08/12/24  1618      History   Chief Complaint Chief Complaint  Patient presents with   Otalgia   Nasal Congestion    HPI JAANAI SALEMI is a 74 y.o. female.   Patient presents for evaluation of the new onset of a sore throat, cough, nasal congestion, right ear pain, feeling warm, and generally unwell since Wednesday evening.  She has been utilizing DayQuil to help manage the symptoms.  No reported measured fever, chest pain, shortness of breath, abdominal pain, vomiting, or diarrhea.  She does report that her sleep was disrupted due to the amount of nasal congestion.  The history is provided by the patient.  Otalgia Associated symptoms: congestion, cough, rhinorrhea and sore throat   Associated symptoms: no abdominal pain, no diarrhea, no fever, no headaches, no rash and no vomiting     Past Medical History:  Diagnosis Date   Arthritis    Hyperlipidemia    Labile hypertension     Patient Active Problem List   Diagnosis Date Noted   Aortic atherosclerosis (HCC) - per CT 05/2018 06/22/2019   GERD (gastroesophageal reflux disease) 03/04/2018   Overweight (BMI 25.0-29.9) 03/04/2018   Hypertension 02/20/2016   Osteoporosis 02/20/2016   Abnormal glucose 06/30/2013   Vitamin D  deficiency 06/30/2013   Insomnia 06/30/2013   Anxiety state 06/30/2013   Hyperlipidemia, mixed     Past Surgical History:  Procedure Laterality Date   NECK SURGERY     REDUCTION MAMMAPLASTY Bilateral 2007    OB History   No obstetric history on file.      Home Medications    Prior to Admission medications  Medication Sig Start Date End Date Taking? Authorizing Provider  benzonatate  (TESSALON ) 200 MG capsule Take 1 capsule (200 mg total) by mouth every 8 (eight) hours as needed for cough. 08/12/24  Yes Janet Therisa PARAS, FNP  celecoxib  (CELEBREX ) 100 MG capsule TAKE 1 CAPSULE BY MOUTH TWICE DAILY WITH FOOD AS NEEDED FOR  PAIN AND INFLAMMATION 07/01/24  Yes Tanda Bleacher, MD  Cholecalciferol (VITAMIN D  PO) Take 5,000 Units by mouth daily.   Yes [provider]  ezetimibe  (ZETIA ) 10 MG tablet Take  1 tablet  Daily  for Cholesterol 07/01/24  Yes Tanda Bleacher, MD  Glycerin-Polysorbate 80 (REFRESH DRY EYE THERAPY OP) Apply 1 drop to eye daily.   Yes [provider]  hydrOXYzine  (ATARAX ) 50 MG tablet TAKE ONE-HALF TO ONE (0.5-1) TABLET BY MOUTH AT NIGHT FOR INSOMNIA 07/01/24  Yes Tanda Bleacher, MD  pantoprazole  (PROTONIX ) 40 MG tablet Take  1 tablet  Daily  to Prevent Heartburn and Indigestion 01/01/24  Yes Reddick, Johnathan B, NP  sucralfate  (CARAFATE ) 1 g tablet DISSOLVE 1 TABLET IN 2-3 OZ OF WATER AND DRINK 30 MINUTES BEFORE EACH MEAL AND DINNER(4 TIMES DAILY) 07/01/24  Yes Tanda Bleacher, MD  vitamin C (ASCORBIC ACID) 500 MG tablet Take 500 mg by mouth 2 (two) times daily.    [provider]  vitamin E 100 UNIT capsule Take 100 Units by mouth every morning.    [provider]  Zinc  50 MG TABS Take 1 tablet Daily 03/25/19   Tonita Fallow, MD    Family History Family History  Problem Relation Age of Onset   Heart attack Mother    Diabetes Mother    Heart attack Father    Diabetes Other    Multiple sclerosis Other  Social History Social History[1]   Allergies   Gabapentin    Review of Systems Review of Systems  Constitutional:  Negative for chills and fever.  HENT:  Positive for congestion, ear pain, rhinorrhea, sinus pressure and sore throat.   Eyes:  Negative for pain and redness.  Respiratory:  Positive for cough. Negative for shortness of breath.   Cardiovascular:  Negative for chest pain.  Gastrointestinal:  Positive for nausea. Negative for abdominal pain, diarrhea and vomiting.  Genitourinary:  Negative for dysuria.  Musculoskeletal:  Negative for arthralgias and myalgias.  Skin:  Negative for rash.  Neurological:  Negative for dizziness and  headaches.  Psychiatric/Behavioral:  Positive for sleep disturbance. The patient is not nervous/anxious.      Physical Exam Triage Vital Signs ED Triage Vitals  Encounter Vitals Group     BP 08/12/24 1723 (!) 145/83     Girls Systolic BP Percentile --      Girls Diastolic BP Percentile --      Boys Systolic BP Percentile --      Boys Diastolic BP Percentile --      Pulse Rate 08/12/24 1723 84     Resp 08/12/24 1723 16     Temp 08/12/24 1723 98.7 F (37.1 C)     Temp Source 08/12/24 1723 Oral     SpO2 08/12/24 1723 96 %     Weight --      Height --      Head Circumference --      Peak Flow --      Pain Score 08/12/24 1719 8     Pain Loc --      Pain Education --      Exclude from Growth Chart --    No data found.  Updated Vital Signs BP (!) 145/83 (BP Location: Right Arm)   Pulse 84   Temp 98.7 F (37.1 C) (Oral)   Resp 16   LMP  (LMP Unknown)   SpO2 96%   Physical Exam Vitals and nursing note reviewed.  Constitutional:      Appearance: Normal appearance.  HENT:     Head: Normocephalic.     Right Ear: Tympanic membrane and ear canal normal.     Left Ear: Tympanic membrane and ear canal normal.     Nose: Congestion and rhinorrhea present.     Mouth/Throat:     Mouth: Mucous membranes are moist.     Pharynx: Posterior oropharyngeal erythema present.  Eyes:     Conjunctiva/sclera: Conjunctivae normal.     Pupils: Pupils are equal, round, and reactive to light.  Cardiovascular:     Rate and Rhythm: Normal rate and regular rhythm.     Heart sounds: No murmur heard. Pulmonary:     Effort: Pulmonary effort is normal.     Breath sounds: Normal breath sounds. No wheezing, rhonchi or rales.  Abdominal:     General: Bowel sounds are normal.  Musculoskeletal:     Cervical back: Normal range of motion.  Skin:    General: Skin is warm and dry.  Neurological:     General: No focal deficit present.     Mental Status: She is alert and oriented to person, place, and  time.  Psychiatric:        Mood and Affect: Mood normal.        Behavior: Behavior normal.        Thought Content: Thought content normal.        Judgment: Judgment  normal.    UC Treatments / Results  Labs (all labs ordered are listed, but only abnormal results are displayed) Labs Reviewed  POCT INFLUENZA A/B - Normal  POC SOFIA SARS ANTIGEN FIA - Normal    EKG   Radiology No results found.  Procedures Procedures (including critical care time)  Medications Ordered in UC Medications - No data to display  Initial Impression / Assessment and Plan / UC Course  I have reviewed the triage vital signs and the nursing notes.  Pertinent labs & imaging results that were available during my care of the patient were reviewed by me and considered in my medical decision making (see chart for details).    Patient is with a 48-hour history of URI symptoms at a cough.  She is afebrile and without chest pain or shortness of breath.  Negative COVID and influenza A/B testing.  I reviewed use of continued OTC medications such as Coricidin HBP cough and cold (reviewed that some decongestants can elevate her blood pressure).  Ensure adequate rest and oral hydration.  Red flags were reviewed which would warrant return evaluation.  Overall, her physical exam is reassuring and her vital signs are stable.  Final Clinical Impressions(s) / UC Diagnoses   Final diagnoses:  Acute cough  Acute upper respiratory infection     Discharge Instructions      Negative COVID and Influenza A/B testing A prescription for a capsule to help relieve your cough has been sent to the pharmacy on file. Recommend use of Coricidin HBP Cough and Cold to help with your runny nose and cough. Ensure adequate rest and hydration. Return for evaluation if you develop a fever, chest pain, shortness of breath, or vomiting you can't stop.     ED Prescriptions     Medication Sig Dispense Auth. Provider   benzonatate   (TESSALON ) 200 MG capsule Take 1 capsule (200 mg total) by mouth every 8 (eight) hours as needed for cough. 30 capsule Janet Therisa PARAS, FNP      PDMP not reviewed this encounter.    [1]  Social History Tobacco Use   Smoking status: Never   Smokeless tobacco: Never  Vaping Use   Vaping status: Never Used  Substance Use Topics   Alcohol use: No   Drug use: No     Janet Therisa PARAS, FNP 08/12/24 1916  "

## 2024-08-12 NOTE — Discharge Instructions (Addendum)
 Negative COVID and Influenza A/B testing A prescription for a capsule to help relieve your cough has been sent to the pharmacy on file. Recommend use of Coricidin HBP Cough and Cold to help with your runny nose and cough. Ensure adequate rest and hydration. Return for evaluation if you develop a fever, chest pain, shortness of breath, or vomiting you can't stop.

## 2024-08-26 ENCOUNTER — Institutional Professional Consult (permissible substitution) (INDEPENDENT_AMBULATORY_CARE_PROVIDER_SITE_OTHER): Admitting: Physician Assistant

## 2024-08-30 ENCOUNTER — Ambulatory Visit: Admitting: Dermatology
